# Patient Record
Sex: Male | Born: 1960 | Race: White | Hispanic: No | Marital: Married | Smoking: Current every day smoker
Health system: Southern US, Community
[De-identification: ages and names within clinical notes are randomized; demographics above are authoritative.]

## PROBLEM LIST (undated history)

## (undated) DIAGNOSIS — Z86010 Personal history of colonic polyps: Principal | ICD-10-CM

## (undated) DIAGNOSIS — G4730 Sleep apnea, unspecified: Secondary | ICD-10-CM

## (undated) DIAGNOSIS — F329 Major depressive disorder, single episode, unspecified: Secondary | ICD-10-CM

## (undated) DIAGNOSIS — I10 Essential (primary) hypertension: Secondary | ICD-10-CM

## (undated) DIAGNOSIS — E349 Endocrine disorder, unspecified: Secondary | ICD-10-CM

## (undated) DIAGNOSIS — T7840XA Allergy, unspecified, initial encounter: Secondary | ICD-10-CM

## (undated) DIAGNOSIS — E119 Type 2 diabetes mellitus without complications: Secondary | ICD-10-CM

## (undated) DIAGNOSIS — F419 Anxiety disorder, unspecified: Secondary | ICD-10-CM

## (undated) DIAGNOSIS — E7800 Pure hypercholesterolemia, unspecified: Secondary | ICD-10-CM

## (undated) DIAGNOSIS — K219 Gastro-esophageal reflux disease without esophagitis: Secondary | ICD-10-CM

## (undated) DIAGNOSIS — F32A Depression, unspecified: Secondary | ICD-10-CM

## (undated) HISTORY — DX: Major depressive disorder, single episode, unspecified: F32.9

## (undated) HISTORY — PX: POLYPECTOMY: SHX149

## (undated) HISTORY — DX: Type 2 diabetes mellitus without complications: E11.9

## (undated) HISTORY — DX: Gastro-esophageal reflux disease without esophagitis: K21.9

## (undated) HISTORY — DX: Allergy, unspecified, initial encounter: T78.40XA

## (undated) HISTORY — DX: Depression, unspecified: F32.A

## (undated) HISTORY — DX: Sleep apnea, unspecified: G47.30

## (undated) HISTORY — DX: Anxiety disorder, unspecified: F41.9

## (undated) HISTORY — PX: ANTERIOR CRUCIATE LIGAMENT REPAIR: SHX115

## (undated) HISTORY — DX: Endocrine disorder, unspecified: E34.9

## (undated) HISTORY — PX: COLONOSCOPY: SHX174

## (undated) HISTORY — DX: Personal history of colonic polyps: Z86.010

---

## 2003-09-07 ENCOUNTER — Emergency Department (HOSPITAL_COMMUNITY): Admission: EM | Admit: 2003-09-07 | Discharge: 2003-09-07 | Payer: Self-pay | Admitting: Emergency Medicine

## 2007-04-27 ENCOUNTER — Encounter: Admission: RE | Admit: 2007-04-27 | Discharge: 2007-04-27 | Payer: Self-pay | Admitting: Family Medicine

## 2009-05-22 ENCOUNTER — Ambulatory Visit: Payer: Self-pay | Admitting: Cardiology

## 2009-09-22 ENCOUNTER — Encounter
Admission: RE | Admit: 2009-09-22 | Discharge: 2009-12-24 | Payer: Self-pay | Source: Home / Self Care | Admitting: *Deleted

## 2009-12-25 ENCOUNTER — Encounter
Admission: RE | Admit: 2009-12-25 | Discharge: 2010-03-18 | Payer: Self-pay | Source: Home / Self Care | Attending: *Deleted | Admitting: *Deleted

## 2010-03-21 HISTORY — PX: ANTERIOR CRUCIATE LIGAMENT REPAIR: SHX115

## 2011-09-10 ENCOUNTER — Emergency Department (HOSPITAL_COMMUNITY)
Admission: EM | Admit: 2011-09-10 | Discharge: 2011-09-10 | Disposition: A | Discharge status: Home / Self Care | Payer: BC Managed Care – PPO | Attending: Emergency Medicine | Admitting: Emergency Medicine

## 2011-09-10 ENCOUNTER — Encounter (HOSPITAL_COMMUNITY): Payer: Self-pay | Admitting: *Deleted

## 2011-09-10 DIAGNOSIS — H00039 Abscess of eyelid unspecified eye, unspecified eyelid: Secondary | ICD-10-CM | POA: Insufficient documentation

## 2011-09-10 DIAGNOSIS — F17200 Nicotine dependence, unspecified, uncomplicated: Secondary | ICD-10-CM | POA: Insufficient documentation

## 2011-09-10 DIAGNOSIS — E7800 Pure hypercholesterolemia, unspecified: Secondary | ICD-10-CM | POA: Insufficient documentation

## 2011-09-10 DIAGNOSIS — I10 Essential (primary) hypertension: Secondary | ICD-10-CM | POA: Insufficient documentation

## 2011-09-10 DIAGNOSIS — R51 Headache: Secondary | ICD-10-CM | POA: Insufficient documentation

## 2011-09-10 DIAGNOSIS — L03213 Periorbital cellulitis: Secondary | ICD-10-CM

## 2011-09-10 DIAGNOSIS — H5710 Ocular pain, unspecified eye: Secondary | ICD-10-CM | POA: Insufficient documentation

## 2011-09-10 HISTORY — DX: Essential (primary) hypertension: I10

## 2011-09-10 HISTORY — DX: Pure hypercholesterolemia, unspecified: E78.00

## 2011-09-10 MED ORDER — TETRACAINE HCL 0.5 % OP SOLN
1.0000 [drp] | Freq: Once | OPHTHALMIC | Status: AC
  Administered 2011-09-10: 1 [drp] via OPHTHALMIC

## 2011-09-10 MED ORDER — ERYTHROMYCIN 5 MG/GM OP OINT
TOPICAL_OINTMENT | Freq: Four times a day (QID) | OPHTHALMIC | Status: DC
  Administered 2011-09-10: 22:00:00 via OPHTHALMIC
  Filled 2011-09-10: qty 3.5

## 2011-09-10 MED ORDER — TETRACAINE HCL 0.5 % OP SOLN
OPHTHALMIC | Status: AC
  Filled 2011-09-10: qty 2

## 2011-09-10 MED ORDER — AMOXICILLIN-POT CLAVULANATE 875-125 MG PO TABS: 1.0000 | ORAL_TABLET | Freq: Two times a day (BID) | ORAL | Status: AC

## 2011-09-10 MED ORDER — FLUORESCEIN SODIUM 1 MG OP STRP
1.0000 | ORAL_STRIP | Freq: Once | OPHTHALMIC | Status: AC
  Administered 2011-09-10: 1 via OPHTHALMIC

## 2011-09-10 NOTE — Discharge Instructions (Signed)
Periorbital Cellulitis Periorbital cellulitis is a common infection that can affect the eyelid and the soft tissues that surround the eyeball. The infection may also affect the structures that produce and drain tears. It does not affect the eyeball itself. Natural tissue barriers usually prevent the spread of this infection to the eyeball and other deeper areas of the eye socket.  CAUSES  Bacterial infection.   Long-term (chronic) sinus infections.   An object (foreign body) stuck behind the eye.   An injury that goes through the eyelid tissues.   An injury that causes an infection, such as an insect sting.   Fracture of the bone around the eye.   Infections which have spread from the eyelid or other structures around the eye.   Bite wounds.   Inflammation or infection of the lining membranes of the brain (meningitis).   An infection in the blood (septicemia).   Dental infection (abscess).   Viral infection (this is rare).  SYMPTOMS Symptoms usually come on suddenly.  Pain in the eye.   Red, hot, and swollen eyelids and possibly cheeks. The swelling is sometimes bad enough that the eyelids cannot open. Some infections make the eyelids look purple.   Fever and feeling generally ill.   Pain when touching the area around the eye.  DIAGNOSIS  Periorbital cellulitis can be diagnosed from an eye exam. In severe cases, your caregiver might suggest:  Blood tests.   Imaging tests (such as a CT scan) to examine the sinuses and the area around and behind the eyeball.  TREATMENT If your caregiver feels that you do not have any signs of serious infection, treatment may include:  Antibiotics.   Nasal decongestants to reduce swelling.   Referral to a dentist if it is suspected that the infection was caused by a prior tooth infection.   Examination every day to make sure the problem is improving.  HOME CARE INSTRUCTIONS  Take your antibiotics as directed. Finish them even  if you start to feel better.   Some pain is normal with this condition. Take pain medicine as directed by your caregiver. Only take pain medicines approved by your caregiver.   It is important to drink fluids. Drink enough water and fluids to keep your urine clear or pale yellow.   Do not smoke.   Rest and get plenty of sleep.   Mild or moderate fevers generally have no long-term effects and often do not require treatment.   If your caregiver has given you a follow-up appointment, it is very important to keep that appointment. Your caregiver will need to make sure that the infection is getting better. It is important to check that a more serious infection is not developing.  SEEK IMMEDIATE MEDICAL CARE IF:  Your eyelids become more painful, red, warm, or swollen.   You develop double vision or your vision becomes blurred or worsens in any way.   You have trouble moving your eyes.   The eye looks like it is popping out (proptosis).   You develop a severe headache, severe neck pain, or neck stiffness.   You develop repeated vomiting.   You have a fever or persistent symptoms for more than 72 hours.   You have a fever and your symptoms suddenly get worse.  MAKE SURE YOU:  Understand these instructions.   Will watch your condition.   Will get help right away if you are not doing well or get worse.  Document Released: 04/09/2010 Document Revised: 02/24/2011   Document Reviewed: 04/09/2010 [employer] Patient Information 2012 [employer]. 

## 2011-09-10 NOTE — ED Notes (Signed)
Pt c/o left eye pain and swelling since Thursday.

## 2011-09-10 NOTE — ED Provider Notes (Signed)
History   This chart was scribed for Joya Gaskins, MD by Melba Coon. The patient was seen in room APA19/[facility]19 and the patient's care was started at 9:15PM.    CSN: [id number]  Arrival date & time 09/10/11  2048   First MD Initiated Contact with Patient 09/10/11 2113      Chief Complaint  Patient presents with  . Eye Pain     HPI Clayton [name] is a 51 y.o. male who presents to the Emergency Department complaining of cosntant, moderate to severe left eye pain and swelling with an onset 2 days ago. Pt woke up with the present symptoms. No foreign bodies present that he noticed, but states it is possible that something could have went into his eye at work; pt is a Curator. Pt has been putting abx eye drops which have not alleviated the s/s. Pt states that he feels like there is something in his eye and that his eye is always watery. HA started 2 hrs ago. No fever, neck pain, sore throat, rash, back pain, CP, SOB, abd pain, nausea, vomit, diarrhea, dysuria, or extremity pain, edema, weakness, numbness, or tingling. Hx of HTN and high cholesterol. No known allergies. No other pertinent medical symptoms.  Past Medical History  Diagnosis Date  . Hypertension   . High cholesterol     Past Surgical History  Procedure Date  . Anterior cruciate ligament repair     History reviewed. No pertinent family history.  History  Substance Use Topics  . Smoking status: Current Everyday Smoker  . Smokeless tobacco: Not on file  . Alcohol Use: Yes      Review of Systems 10 Systems reviewed and all are negative for acute change except as noted in the HPI.   Allergies  Review of patient's allergies indicates no known allergies.  Home Medications  No current outpatient prescriptions on file.  BP 149/79  Pulse 87  Temp 97.9 F (36.6 C) (Oral)  Resp 14  Ht 5\' 10"  (1.778 m)  Wt 248 lb (112.492 kg)  BMI 35.58 kg/m2  SpO2 97%  Physical Exam CONSTITUTIONAL: Well  developed/well nourished HEAD AND FACE: Normocephalic/atraumatic EYES: EOMI/PERRL; periorbital edema of left eyelid w/ small amount of discharge No proptosis.  No hypopyon.  No abrasions.  No foreign body noted to left eye No abscess noted ENMT: Mucous membranes moist NECK: supple no meningeal signs SPINE:entire spine nontender CV: S1/S2 noted, no murmurs/rubs/gallops noted LUNGS: Lungs are clear to auscultation bilaterally, no apparent distress ABDOMEN: soft, nontender, no rebound or guarding GU:no cva tenderness NEURO: Pt is awake/alert, moves all extremitiesx4 EXTREMITIES: pulses normal, full ROM SKIN: warm, color normal PSYCH: no abnormalities of mood noted  ED Course  Procedures   DIAGNOSTIC STUDIES: Oxygen Saturation is 97% on room air, normal by my interpretation.    COORDINATION OF CARE:  9:20PM - EDMD will perform more extensive eye exam for the pt and ordered pontocaine and fluorescin ophthalmic strip for the pt. Likely early periorbital cellulitis, no visual deficits, stable for d/c and outpatient meds    MDM  Nursing notes including past medical history and social history reviewed and considered in documentation  I personally performed the services described in this documentation, which was scribed in my presence. The recorded information has been reviewed and considered.           Joya Gaskins, MD 09/11/11 [phone]

## 2011-11-06 LAB — HEPATIC FUNCTION PANEL
ALT: 33 U/L (ref 10–40)
AST: 18 U/L (ref 14–40)
Alkaline Phosphatase: 54 U/L (ref 25–125)
Bilirubin, Direct: 0.2 mg/dL (ref 0.01–0.4)
Bilirubin, Total: 0.8 mg/dL

## 2011-11-06 LAB — LIPID PANEL
Cholesterol: 213 mg/dL — AB (ref 0–200)
HDL: 35 mg/dL (ref 35–70)
LDL Cholesterol: 133 mg/dL
Triglycerides: 223 mg/dL — AB (ref 40–160)

## 2011-11-06 LAB — BASIC METABOLIC PANEL
BUN: 15 mg/dL (ref 4–21)
Creatinine: 0.9 mg/dL (ref 0.6–1.3)
Glucose: 119 mg/dL
Potassium: 3.6 mmol/L (ref 3.4–5.3)
Sodium: 138 mmol/L (ref 137–147)

## 2012-06-14 ENCOUNTER — Ambulatory Visit (INDEPENDENT_AMBULATORY_CARE_PROVIDER_SITE_OTHER): Payer: BC Managed Care – PPO | Admitting: Nurse Practitioner

## 2012-06-14 ENCOUNTER — Telehealth: Payer: Self-pay | Admitting: Nurse Practitioner

## 2012-06-14 ENCOUNTER — Encounter: Payer: Self-pay | Admitting: Nurse Practitioner

## 2012-06-14 VITALS — BP 159/86 | HR 76 | Temp 98.4°F | Ht 69.0 in | Wt 254.5 lb

## 2012-06-14 DIAGNOSIS — F329 Major depressive disorder, single episode, unspecified: Secondary | ICD-10-CM

## 2012-06-14 DIAGNOSIS — E1169 Type 2 diabetes mellitus with other specified complication: Secondary | ICD-10-CM | POA: Insufficient documentation

## 2012-06-14 DIAGNOSIS — H9209 Otalgia, unspecified ear: Secondary | ICD-10-CM

## 2012-06-14 DIAGNOSIS — F32A Depression, unspecified: Secondary | ICD-10-CM | POA: Insufficient documentation

## 2012-06-14 DIAGNOSIS — R1310 Dysphagia, unspecified: Secondary | ICD-10-CM

## 2012-06-14 DIAGNOSIS — I10 Essential (primary) hypertension: Secondary | ICD-10-CM

## 2012-06-14 DIAGNOSIS — H9202 Otalgia, left ear: Secondary | ICD-10-CM

## 2012-06-14 DIAGNOSIS — E785 Hyperlipidemia, unspecified: Secondary | ICD-10-CM

## 2012-06-14 DIAGNOSIS — K219 Gastro-esophageal reflux disease without esophagitis: Secondary | ICD-10-CM

## 2012-06-14 HISTORY — DX: Essential (primary) hypertension: I10

## 2012-06-14 HISTORY — DX: Type 2 diabetes mellitus with other specified complication: E11.69

## 2012-06-14 MED ORDER — PANTOPRAZOLE SODIUM 40 MG PO TBEC: 40.0000 mg | DELAYED_RELEASE_TABLET | Freq: Every day | ORAL | Status: DC

## 2012-06-14 NOTE — Patient Instructions (Signed)
1. Left ear pain Continue Amoxicillin currently have OTC decongestant  2. Dysphagia, unspecified/GERD Stop prilosec Avoid spicy and fatty foods - pantoprazole (PROTONIX) 40 MG tablet; Take 1 tablet (40 mg total) by mouth daily.  Dispense: 30 tablet; Refill: 3 - Ambulatory referral to Gastroenterology  3. Essential hypertension, benign Continue current meds  4. Hyperlipidemia LDL goal < 100 Continue current meds Low fat diet  5. Depression Continue current meds  Stress management  NEEDS F/U appoint for labsDysphagia Swallowing problems (dysphagia) occur when solids and liquids seem to stick in your throat on the way down to your stomach, or the food takes longer to get to the stomach. Other symptoms (problems) include regurgitating (burping) up food, noises coming from the throat, chest discomfort with swallowing, and a feeling of fullness in the throat when swallowing. When blockage in the throat is complete it may be associated with drooling. CAUSES There are many causes of swallowing difficulties and the following is generalized information regarding a number of reasons for this problem. Problems with swallowing may occur because of problems with the muscles. The food cannot be propelled in the usual manner into the stomach. There may be ulcers, scar tissue, or inflammation (soreness) in the esophagus (the food tube from the mouth to the stomach) which blocks food from passing normally into the stomach. Causes of inflammation include acid reflux from the stomach into the esophagus. Inflammation can also be caused by the herpes simplex virus, Candida (yeast), radiation (as with treatment of cancer), or inflammation from medicines not taken with adequate fluids to wash them down into the stomach. There may be nerve problems so signals cannot be sent adequately telling the muscles of the esophagus to contract and move the food along. Achalasia is a rare disorder of the esophagus in which  muscular contractions of the esophagus are uncoordinated. Globus hystericus is a relatively common problem in young females in which there is a sense of an obstruction or difficulty in swallowing, but in which no abnormalities can be found. This problem usually improves over time with reassurance and testing to rule out other causes. DIAGNOSIS A number of tests will help your caregiver know what is the cause of your swallowing problems. These tests may include a barium swallow in which X-rays are taken while you are drinking a liquid that outlines the lining of the esophagus on X-ray. If the stomach and small bowel are also studied in this manner it is called an upper gastrointestinal exam (UGI). Endoscopy may be done in which your caregiver examines your throat, esophagus, stomach, and small bowel with a small, flexible scope. Motility studies which measure the effectiveness and coordination of the muscular contractions of the esophagus may also be done. TREATMENT The treatment of swallowing problems are many, varying from medicines to surgical treatment. The treatment varies with the type of problem found. Your caregiver will discuss your results and treatment with you. If swallowing problems are severe the long-term problems which may occur include: malnutrition, pneumonia (from food going into the breathing tubes called trachea and bronchi), and an increase in tumors (lumps) of the esophagus. SEEK IMMEDIATE MEDICAL CARE IF:  Food or another object becomes lodged in your throat or esophagus and will not move. Document Released: 03/04/2000 Document Revised: 09/06/2011 Document Reviewed: 10/24/2007 [facility] Patient Information 2013 [place], [place].   Mary-Margaret Daphine Deutscher, FNP

## 2012-06-14 NOTE — Telephone Encounter (Signed)
appt made

## 2012-06-14 NOTE — Telephone Encounter (Signed)
Wtbs, ear ache

## 2012-06-14 NOTE — Progress Notes (Signed)
  Subjective:    Patient ID: Clayton [name], male    DOB: [birth year], 52 y.o.   MRN: [id number]  HPI.Marland KitchenUpper Respiratory Infection: Patient complains of symptoms of a URI, left ear pain. Symptoms include left ear pain. Onset of symptoms was 5 days ago, gradually worsening since that time. He also c/o headache described as achy for the past 3 days .  He is drinking plenty of fluids. Evaluation to date: none sinus films: not done. Treatment to date: antibiotics that belong to his son, started taking Monday BID. Dysphagia Started 4 months ago. Even as trouble swallowing liquids. Dad had to have his esophagus stretched. FREQUENT COUGH.      Review of Systems  Constitutional: Negative.   HENT: Positive for ear pain. Negative for congestion, rhinorrhea and sinus pressure.   Respiratory: Negative.   Cardiovascular: Negative.   Psychiatric/Behavioral: Negative.        Objective:   Physical Exam  Constitutional: He appears well-developed and well-nourished.  HENT:  Head: Normocephalic.  Right Ear: External ear normal. Tympanic membrane is not scarred, not erythematous and not bulging.  Left Ear: External ear normal. Tympanic membrane is scarred. Tympanic membrane is not erythematous and not bulging.  Nose: Mucosal edema present. Right sinus exhibits no maxillary sinus tenderness and no frontal sinus tenderness. Left sinus exhibits no maxillary sinus tenderness and no frontal sinus tenderness.  Mouth/Throat: Uvula is midline, oropharynx is clear and moist and mucous membranes are normal.  Neck: Neck supple. No JVD present. Carotid bruit is not present. No mass and no thyromegaly present.  Cardiovascular: Normal rate, regular rhythm, normal heart sounds and intact distal pulses.   Pulmonary/Chest: Effort normal and breath sounds normal.  Lymphadenopathy:    He has no cervical adenopathy.  Skin: Skin is warm and dry.          Assessment & Plan:  1. Left ear pain Continue Amoxicillin  currently have OTC decongestant  2. Dysphagia, unspecified/GERD Stop prilosec Avoid spicy and fatty foods - pantoprazole (PROTONIX) 40 MG tablet; Take 1 tablet (40 mg total) by mouth daily.  Dispense: 30 tablet; Refill: 3 - Ambulatory referral to Gastroenterology  3. Essential hypertension, benign Continue current meds  4. Hyperlipidemia LDL goal < 100 Continue current meds Low fat diet  5. Depression Continue current meds  Stress management  NEEDS F/U appoint for labs  Mary-Margaret Daphine Deutscher, FNP

## 2012-06-15 ENCOUNTER — Encounter: Payer: Self-pay | Admitting: *Deleted

## 2012-06-15 ENCOUNTER — Other Ambulatory Visit (INDEPENDENT_AMBULATORY_CARE_PROVIDER_SITE_OTHER): Payer: BC Managed Care – PPO

## 2012-06-15 DIAGNOSIS — I10 Essential (primary) hypertension: Secondary | ICD-10-CM

## 2012-06-15 DIAGNOSIS — E785 Hyperlipidemia, unspecified: Secondary | ICD-10-CM

## 2012-06-15 DIAGNOSIS — Z125 Encounter for screening for malignant neoplasm of prostate: Secondary | ICD-10-CM

## 2012-06-15 DIAGNOSIS — E559 Vitamin D deficiency, unspecified: Secondary | ICD-10-CM

## 2012-06-15 LAB — COMPREHENSIVE METABOLIC PANEL
ALT: 27 U/L (ref 0–53)
AST: 16 U/L (ref 0–37)
Albumin: 4.2 g/dL (ref 3.5–5.2)
Alkaline Phosphatase: 81 U/L (ref 39–117)
BUN: 17 mg/dL (ref 6–23)
CO2: 30 mEq/L (ref 19–32)
Calcium: 9 mg/dL (ref 8.4–10.5)
Chloride: 100 mEq/L (ref 96–112)
Creat: 0.77 mg/dL (ref 0.50–1.35)
Glucose, Bld: 125 mg/dL — ABNORMAL HIGH (ref 70–99)
Potassium: 3.7 mEq/L (ref 3.5–5.3)
Sodium: 139 mEq/L (ref 135–145)
Total Bilirubin: 0.9 mg/dL (ref 0.3–1.2)
Total Protein: 6.7 g/dL (ref 6.0–8.3)

## 2012-06-15 NOTE — Telephone Encounter (Signed)
This encounter was created in error - please disregard.

## 2012-06-16 LAB — PSA: PSA: 0.7 ng/mL (ref <=4.00)

## 2012-06-16 LAB — VITAMIN D 25 HYDROXY (VIT D DEFICIENCY, FRACTURES): Vit D, 25-Hydroxy: 27 ng/mL — ABNORMAL LOW (ref 30–89)

## 2012-06-19 LAB — NMR LIPOPROFILE WITH LIPIDS
Cholesterol, Total: 139 mg/dL (ref <200)
HDL Particle Number: 23.8 umol/L — ABNORMAL LOW (ref >=30.5)
HDL Size: 8.6 nm — ABNORMAL LOW (ref >=9.2)
HDL-C: 28 mg/dL — ABNORMAL LOW (ref >=40)
LDL (calc): 72 mg/dL (ref <100)
LDL Particle Number: 1286 nmol/L — ABNORMAL HIGH (ref <1000)
LDL Size: 19.7 nm — ABNORMAL LOW (ref >20.5)
LP-IR Score: 84 — ABNORMAL HIGH (ref <=45)
Large HDL-P: <1.3 umol/L — ABNORMAL LOW (ref >=4.8)
Large VLDL-P: 9.2 nmol/L — ABNORMAL HIGH (ref <=2.7)
Small LDL Particle Number: 1056 nmol/L — ABNORMAL HIGH (ref <=527)
Triglycerides: 197 mg/dL — ABNORMAL HIGH (ref <150)
VLDL Size: 53 nm — ABNORMAL HIGH (ref >=46.6)

## 2012-06-25 ENCOUNTER — Telehealth: Payer: Self-pay | Admitting: Nurse Practitioner

## 2012-06-25 NOTE — Telephone Encounter (Signed)
Spoke with pt

## 2012-07-03 ENCOUNTER — Ambulatory Visit: Payer: BC Managed Care – PPO | Admitting: Nurse Practitioner

## 2012-07-13 ENCOUNTER — Ambulatory Visit: Payer: BC Managed Care – PPO | Admitting: Nurse Practitioner

## 2012-07-20 ENCOUNTER — Telehealth: Payer: Self-pay | Admitting: Nurse Practitioner

## 2012-07-20 NOTE — Telephone Encounter (Signed)
Which meds is he concerned about

## 2012-07-21 NOTE — Telephone Encounter (Signed)
His blood pressure RX

## 2012-07-23 MED ORDER — LOSARTAN POTASSIUM-HCTZ 100-25 MG PO TABS: 1.0000 | ORAL_TABLET | Freq: Every day | ORAL | Status: DC

## 2012-07-23 NOTE — Telephone Encounter (Signed)
rx sent in- patient aware.  

## 2012-07-23 NOTE — Telephone Encounter (Signed)
CHANGED TO LOSARTAN/HCTZ- RX SENT TO PHARMACY

## 2012-07-27 ENCOUNTER — Other Ambulatory Visit: Payer: Self-pay

## 2012-07-27 MED ORDER — ATORVASTATIN CALCIUM 40 MG PO TABS: 40.0000 mg | ORAL_TABLET | Freq: Every day | ORAL | Status: DC

## 2012-07-27 MED ORDER — CITALOPRAM HYDROBROMIDE 40 MG PO TABS: 40.0000 mg | ORAL_TABLET | Freq: Every day | ORAL | Status: DC

## 2012-08-15 ENCOUNTER — Ambulatory Visit: Payer: Self-pay | Admitting: Nurse Practitioner

## 2012-09-06 ENCOUNTER — Other Ambulatory Visit: Payer: Self-pay

## 2012-09-06 MED ORDER — CITALOPRAM HYDROBROMIDE 40 MG PO TABS: 40.0000 mg | ORAL_TABLET | Freq: Every day | ORAL | Status: DC

## 2012-10-16 ENCOUNTER — Other Ambulatory Visit: Payer: Self-pay | Admitting: *Deleted

## 2012-10-16 DIAGNOSIS — K219 Gastro-esophageal reflux disease without esophagitis: Secondary | ICD-10-CM

## 2012-10-16 DIAGNOSIS — R1310 Dysphagia, unspecified: Secondary | ICD-10-CM

## 2012-10-16 MED ORDER — CITALOPRAM HYDROBROMIDE 40 MG PO TABS: 40.0000 mg | ORAL_TABLET | Freq: Every day | ORAL | Status: DC

## 2012-10-16 MED ORDER — PANTOPRAZOLE SODIUM 40 MG PO TBEC: 40.0000 mg | DELAYED_RELEASE_TABLET | Freq: Every day | ORAL | Status: DC

## 2012-10-16 NOTE — Telephone Encounter (Signed)
LAST OV 3/14

## 2012-10-18 ENCOUNTER — Telehealth: Payer: Self-pay | Admitting: *Deleted

## 2012-10-18 NOTE — Telephone Encounter (Signed)
Received request for a rx refill from [facility] for HCTZ 25 qd. According to chart he is taking Losartan-hctz 100-25. Last office visit note says to continue same meds and no changes were made. Which med should he be taking. Please advise.

## 2012-10-18 NOTE — Telephone Encounter (Signed)
Find out from patient what he is taking

## 2012-10-25 ENCOUNTER — Telehealth: Payer: Self-pay | Admitting: Nurse Practitioner

## 2012-10-25 NOTE — Telephone Encounter (Signed)
Message being taken care of in another encounter.

## 2012-10-25 NOTE — Telephone Encounter (Signed)
Patient states he is taking the losartan-hctz not just plain hctz and he has already gotten his medications refilled

## 2012-11-16 ENCOUNTER — Encounter: Payer: Self-pay | Admitting: Nurse Practitioner

## 2012-11-16 ENCOUNTER — Ambulatory Visit (INDEPENDENT_AMBULATORY_CARE_PROVIDER_SITE_OTHER): Payer: BC Managed Care – PPO | Admitting: Nurse Practitioner

## 2012-11-16 VITALS — BP 152/85 | HR 85 | Temp 98.5°F | Wt 256.0 lb

## 2012-11-16 DIAGNOSIS — F32A Depression, unspecified: Secondary | ICD-10-CM

## 2012-11-16 DIAGNOSIS — R739 Hyperglycemia, unspecified: Secondary | ICD-10-CM

## 2012-11-16 DIAGNOSIS — R7309 Other abnormal glucose: Secondary | ICD-10-CM

## 2012-11-16 DIAGNOSIS — I10 Essential (primary) hypertension: Secondary | ICD-10-CM

## 2012-11-16 DIAGNOSIS — F329 Major depressive disorder, single episode, unspecified: Secondary | ICD-10-CM

## 2012-11-16 DIAGNOSIS — E785 Hyperlipidemia, unspecified: Secondary | ICD-10-CM

## 2012-11-16 DIAGNOSIS — K219 Gastro-esophageal reflux disease without esophagitis: Secondary | ICD-10-CM

## 2012-11-16 DIAGNOSIS — R1310 Dysphagia, unspecified: Secondary | ICD-10-CM

## 2012-11-16 MED ORDER — CITALOPRAM HYDROBROMIDE 40 MG PO TABS: 40.0000 mg | ORAL_TABLET | Freq: Every day | ORAL | Status: DC

## 2012-11-16 MED ORDER — ATORVASTATIN CALCIUM 40 MG PO TABS: 40.0000 mg | ORAL_TABLET | Freq: Every day | ORAL | Status: DC

## 2012-11-16 MED ORDER — PANTOPRAZOLE SODIUM 40 MG PO TBEC: 40.0000 mg | DELAYED_RELEASE_TABLET | Freq: Every day | ORAL | Status: DC

## 2012-11-16 MED ORDER — CHOLINE FENOFIBRATE 135 MG PO CPDR: 135.0000 mg | DELAYED_RELEASE_CAPSULE | Freq: Every day | ORAL | Status: DC

## 2012-11-16 MED ORDER — LOSARTAN POTASSIUM-HCTZ 100-25 MG PO TABS: 1.0000 | ORAL_TABLET | Freq: Every day | ORAL | Status: DC

## 2012-11-16 NOTE — Patient Instructions (Signed)

## 2012-11-16 NOTE — Progress Notes (Signed)
Subjective:    Patient ID: Clayton [name], male    DOB: [birth year], 52 y.o.   MRN: [id number]  Hypertension This is a chronic problem. The current episode started more than 1 year ago. The problem is unchanged. The problem is uncontrolled. Pertinent negatives include no blurred vision, chest pain, headaches, neck pain, palpitations, peripheral edema, shortness of breath or sweats. There are no associated agents to hypertension. Risk factors for coronary artery disease include family history, obesity, male gender and dyslipidemia. Past treatments include angiotensin blockers and diuretics. The current treatment provides moderate improvement. Compliance problems include diet and exercise.   Hyperlipidemia This is a chronic problem. The current episode started more than 1 year ago. The problem is uncontrolled. Recent lipid tests were reviewed and are high. Exacerbating diseases include obesity. He has no history of diabetes or hypothyroidism. There are no known factors aggravating his hyperlipidemia. Pertinent negatives include no chest pain, focal sensory loss, focal weakness, leg pain or shortness of breath. Current antihyperlipidemic treatment includes statins and fibric acid derivatives. The current treatment provides moderate improvement of lipids. Compliance problems include adherence to diet and adherence to exercise.  Risk factors for coronary artery disease include hypertension, male sex, obesity and family history.  depression Celexa working well to keep him calm and from worrying so much- No side effects GERD Currently on protonix- keeps symptoms under control  Review of Systems  HENT: Negative for neck pain.   Eyes: Negative for blurred vision.  Respiratory: Negative for shortness of breath.   Cardiovascular: Negative for chest pain and palpitations.  Neurological: Negative for focal weakness and headaches.  All other systems reviewed and are negative.       Objective:   Physical  Exam  Constitutional: He is oriented to person, place, and time. He appears well-developed and well-nourished.  HENT:  Head: Normocephalic.  Right Ear: External ear normal.  Left Ear: External ear normal.  Nose: Nose normal.  Mouth/Throat: Oropharynx is clear and moist.  Eyes: EOM are normal. Pupils are equal, round, and reactive to light.  Neck: Normal range of motion. Neck supple. No thyromegaly present.  Cardiovascular: Normal rate, regular rhythm, normal heart sounds and intact distal pulses.   No murmur heard. Pulmonary/Chest: Effort normal and breath sounds normal. He has no wheezes. He has no rales.  Abdominal: Soft. Bowel sounds are normal.  Musculoskeletal: Normal range of motion.  Neurological: He is alert and oriented to person, place, and time.  Skin: Skin is warm and dry.  Psychiatric: He has a normal mood and affect. His behavior is normal. Judgment and thought content normal.    BP 152/85  Pulse 85  Temp(Src) 98.5 F (36.9 C) (Oral)  Wt 256 lb (116.121 kg)  BMI 37.79 kg/m2       Assessment & Plan:  1. Hypertension Low NA+ diet - CMP14+EGFR - losartan-hydrochlorothiazide (HYZAAR) 100-25 MG per tablet; Take 1 tablet by mouth daily.  Dispense: 30 tablet; Refill: 5  2. Hyperlipidemia Low fat diet and exercise - NMR, lipoprofile - atorvastatin (LIPITOR) 40 MG tablet; Take 1 tablet (40 mg total) by mouth daily.  Dispense: 30 tablet; Refill: 5 - Choline Fenofibrate (TRILIPIX) 135 MG capsule; Take 1 capsule (135 mg total) by mouth daily.  Dispense: 30 capsule; Refill: 5  3. GERD (gastroesophageal reflux disease) Avoid spicy foods Do not eat 2 hours prior to bedtime - pantoprazole (PROTONIX) 40 MG tablet; Take 1 tablet (40 mg total) by mouth daily.  Dispense: 30 tablet; Refill:  5  4. Depression Stress management - citalopram (CELEXA) 40 MG tablet; Take 1 tablet (40 mg total) by mouth daily.  Dispense: 30 tablet; Refill: 5  Mary-Margaret Daphine Deutscher, FNP

## 2012-11-18 LAB — NMR, LIPOPROFILE
Cholesterol: 202 mg/dL — ABNORMAL HIGH (ref <200)
HDL Cholesterol by NMR: 27 mg/dL — ABNORMAL LOW (ref >=40)
HDL Particle Number: 26.6 umol/L — ABNORMAL LOW (ref >=30.5)
LDL Particle Number: 1905 nmol/L — ABNORMAL HIGH (ref <1000)
LDL Size: 19.8 nm — ABNORMAL LOW (ref >20.5)
LP-IR Score: 96 — ABNORMAL HIGH (ref <=45)
Small LDL Particle Number: 1508 nmol/L — ABNORMAL HIGH (ref <=527)
Triglycerides by NMR: 821 mg/dL — ABNORMAL HIGH (ref <150)

## 2012-11-18 LAB — CMP14+EGFR
ALT: 29 IU/L (ref 0–44)
AST: 14 IU/L (ref 0–40)
Albumin/Globulin Ratio: 1.4 (ref 1.1–2.5)
Albumin: 4.1 g/dL (ref 3.5–5.5)
Alkaline Phosphatase: 108 IU/L (ref 39–117)
BUN/Creatinine Ratio: 18 (ref 9–20)
BUN: 13 mg/dL (ref 6–24)
CO2: 25 mmol/L (ref 18–29)
Calcium: 9.3 mg/dL (ref 8.7–10.2)
Chloride: 96 mmol/L — ABNORMAL LOW (ref 97–108)
Creatinine, Ser: 0.74 mg/dL — ABNORMAL LOW (ref 0.76–1.27)
GFR calc Af Amer: 124 mL/min/{1.73_m2} (ref >59)
GFR calc non Af Amer: 108 mL/min/{1.73_m2} (ref >59)
Globulin, Total: 2.9 g/dL (ref 1.5–4.5)
Glucose: 194 mg/dL — ABNORMAL HIGH (ref 65–99)
Potassium: 3.2 mmol/L — ABNORMAL LOW (ref 3.5–5.2)
Sodium: 137 mmol/L (ref 134–144)
Total Bilirubin: 0.4 mg/dL (ref 0.0–1.2)
Total Protein: 7 g/dL (ref 6.0–8.5)

## 2012-11-23 LAB — POCT GLYCOSYLATED HEMOGLOBIN (HGB A1C): Hemoglobin A1C: 7.1

## 2012-11-23 NOTE — Addendum Note (Signed)
Addended by: Prescott Gum on: 11/23/2012 09:21 AM   Modules accepted: Orders

## 2012-12-27 ENCOUNTER — Other Ambulatory Visit: Payer: Self-pay | Admitting: Nurse Practitioner

## 2012-12-27 MED ORDER — METFORMIN HCL 500 MG PO TABS: 500.0000 mg | ORAL_TABLET | Freq: Two times a day (BID) | ORAL | Status: DC

## 2013-01-15 ENCOUNTER — Ambulatory Visit: Payer: Self-pay

## 2013-02-22 ENCOUNTER — Encounter: Payer: BC Managed Care – PPO | Admitting: Family Medicine

## 2013-03-01 ENCOUNTER — Encounter: Payer: Self-pay | Admitting: Family Medicine

## 2013-03-01 ENCOUNTER — Ambulatory Visit: Payer: BC Managed Care – PPO | Admitting: Family Medicine

## 2013-03-01 VITALS — BP 126/71 | HR 78 | Temp 98.8°F | Ht 68.5 in | Wt 254.0 lb

## 2013-03-01 DIAGNOSIS — I10 Essential (primary) hypertension: Secondary | ICD-10-CM

## 2013-03-01 DIAGNOSIS — G4733 Obstructive sleep apnea (adult) (pediatric): Secondary | ICD-10-CM | POA: Insufficient documentation

## 2013-03-01 DIAGNOSIS — Z0289 Encounter for other administrative examinations: Secondary | ICD-10-CM

## 2013-03-01 HISTORY — DX: Obstructive sleep apnea (adult) (pediatric): G47.33

## 2013-03-01 LAB — POCT URINALYSIS DIPSTICK
Bilirubin, UA: NEGATIVE
Glucose, UA: NEGATIVE
Ketones, UA: NEGATIVE
Leukocytes, UA: NEGATIVE
Nitrite, UA: NEGATIVE
Protein, UA: NEGATIVE
Spec Grav, UA: >=1.03
Urobilinogen, UA: NEGATIVE
pH, UA: 5

## 2013-03-01 NOTE — Progress Notes (Signed)
   Subjective:    Patient ID: Clayton [name], male    DOB: [birth year], 52 y.o.   MRN: [id number]  HPI This 52 y.o. male presents for evaluation of DOT PE.  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 He was dx with this a few years ago.  He reports 100% compliance.  He has hx of hypertension. He has no acute complaints.   Review of Systems No chest pain, SOB, HA, dizziness, vision change, N/V, diarrhea, constipation, dysuria, urinary urgency or frequency, myalgias, arthralgias or rash.     Objective:   Physical Exam  Vital signs noted  Well developed well nourished male.  HEENT - Head atraumatic Normocephalic                Eyes - PERRLA, Conjuctiva - clear Sclera- Clear EOMI                Ears - EAC's Wnl TM's Wnl Gross Hearing WNL                Nose - Nares patent                 Throat - oropharanx wnl Respiratory - Lungs CTA bilateral Cardiac - RRR S1 and S2 without murmur GI - Abdomen soft Nontender and bowel sounds active x 4 Extremities - No edema. Neuro - Grossly intact.  Results for orders placed in visit on 03/01/13  POCT URINALYSIS DIPSTICK      Result Value Range   Color, UA GOLD     Clarity, UA CLEAR     Glucose, UA NEG     Bilirubin, UA NEG     Ketones, UA NEG     Spec Grav, UA >=1.030     Blood, UA TRACE     pH, UA 5.0     Protein, UA NEG     Urobilinogen, UA negative     Nitrite, UA NEG     Leukocytes, UA Negative        Assessment & Plan:  Hypertension - Plan: POCT urinalysis.  Bp is controlled.  OSA - Continue bipap and bring in report of compliancy.  DOT exam - Need to bring in bipap compliancy report and explained that he can go to his respiratory Office for this and they will provide him this information and then will be able to certify him for a year.  Deatra Canter FNP

## 2013-03-01 NOTE — Patient Instructions (Signed)

## 2013-03-08 ENCOUNTER — Telehealth: Payer: Self-pay | Admitting: Family Medicine

## 2013-03-12 NOTE — Telephone Encounter (Signed)
Patient was notified by occupational health and he is coming back in to complete the paperwork next week.

## 2013-05-31 ENCOUNTER — Ambulatory Visit: Payer: BC Managed Care – PPO | Admitting: Nurse Practitioner

## 2013-09-21 ENCOUNTER — Other Ambulatory Visit: Payer: Self-pay | Admitting: Nurse Practitioner

## 2013-09-24 ENCOUNTER — Other Ambulatory Visit: Payer: Self-pay | Admitting: Family Medicine

## 2013-09-24 DIAGNOSIS — I10 Essential (primary) hypertension: Secondary | ICD-10-CM

## 2013-09-24 DIAGNOSIS — E785 Hyperlipidemia, unspecified: Secondary | ICD-10-CM

## 2013-09-24 MED ORDER — LOSARTAN POTASSIUM-HCTZ 100-25 MG PO TABS: 1.0000 | ORAL_TABLET | Freq: Every day | ORAL | Status: DC

## 2013-09-24 MED ORDER — ATORVASTATIN CALCIUM 40 MG PO TABS: 40.0000 mg | ORAL_TABLET | Freq: Every day | ORAL | Status: DC

## 2013-09-24 NOTE — Telephone Encounter (Signed)
Last seen 03/01/13 and last labs 11/16/12

## 2013-11-02 ENCOUNTER — Other Ambulatory Visit: Payer: Self-pay | Admitting: Family Medicine

## 2013-11-04 NOTE — Telephone Encounter (Signed)
no more refills without being seen  

## 2013-12-13 ENCOUNTER — Other Ambulatory Visit: Payer: Self-pay | Admitting: Nurse Practitioner

## 2013-12-16 NOTE — Telephone Encounter (Signed)
Patient last seen in office on 03-01-13. Was supposed to follow up in 3 months. Please advise on refill

## 2013-12-16 NOTE — Telephone Encounter (Signed)
no more refills without being seen  

## 2014-01-18 ENCOUNTER — Other Ambulatory Visit: Payer: Self-pay | Admitting: Nurse Practitioner

## 2014-02-11 ENCOUNTER — Encounter: Payer: Self-pay | Admitting: Nurse Practitioner

## 2014-02-11 ENCOUNTER — Ambulatory Visit: Payer: BC Managed Care – PPO | Admitting: Nurse Practitioner

## 2014-02-11 VITALS — BP 138/88 | HR 76 | Temp 97.2°F | Ht 70.0 in | Wt 246.0 lb

## 2014-02-11 DIAGNOSIS — Z024 Encounter for examination for driving license: Secondary | ICD-10-CM

## 2014-02-11 LAB — POCT URINALYSIS DIPSTICK
Bilirubin, UA: NEGATIVE
Blood, UA: NEGATIVE
Glucose, UA: NEGATIVE
Ketones, UA: NEGATIVE
Leukocytes, UA: NEGATIVE
Nitrite, UA: NEGATIVE
Spec Grav, UA: 1.02
Urobilinogen, UA: NEGATIVE
pH, UA: 5

## 2014-02-11 NOTE — Progress Notes (Signed)
DOT PHYSICAL- see scanned report

## 2014-02-21 ENCOUNTER — Ambulatory Visit: Payer: BC Managed Care – PPO | Admitting: Family Medicine

## 2014-02-21 ENCOUNTER — Telehealth: Payer: Self-pay | Admitting: Nurse Practitioner

## 2014-02-27 NOTE — Telephone Encounter (Signed)
done

## 2014-04-04 ENCOUNTER — Ambulatory Visit: Payer: BC Managed Care – PPO | Admitting: Nurse Practitioner

## 2014-04-09 ENCOUNTER — Other Ambulatory Visit: Payer: Self-pay | Admitting: Nurse Practitioner

## 2014-04-09 NOTE — Telephone Encounter (Signed)
Last seen 02/11/14 MMM  Last lipid 11/16/12

## 2014-05-09 ENCOUNTER — Encounter: Payer: Self-pay | Admitting: Nurse Practitioner

## 2014-05-09 ENCOUNTER — Ambulatory Visit (INDEPENDENT_AMBULATORY_CARE_PROVIDER_SITE_OTHER): Payer: BLUE CROSS/BLUE SHIELD | Admitting: Nurse Practitioner

## 2014-05-09 VITALS — BP 138/87 | HR 76 | Temp 98.2°F | Ht 70.0 in | Wt 254.0 lb

## 2014-05-09 DIAGNOSIS — F329 Major depressive disorder, single episode, unspecified: Secondary | ICD-10-CM

## 2014-05-09 DIAGNOSIS — E119 Type 2 diabetes mellitus without complications: Secondary | ICD-10-CM

## 2014-05-09 DIAGNOSIS — F17200 Nicotine dependence, unspecified, uncomplicated: Secondary | ICD-10-CM

## 2014-05-09 DIAGNOSIS — I10 Essential (primary) hypertension: Secondary | ICD-10-CM

## 2014-05-09 DIAGNOSIS — E785 Hyperlipidemia, unspecified: Secondary | ICD-10-CM

## 2014-05-09 DIAGNOSIS — F32A Depression, unspecified: Secondary | ICD-10-CM

## 2014-05-09 DIAGNOSIS — K219 Gastro-esophageal reflux disease without esophagitis: Secondary | ICD-10-CM

## 2014-05-09 DIAGNOSIS — Z720 Tobacco use: Secondary | ICD-10-CM

## 2014-05-09 DIAGNOSIS — G4733 Obstructive sleep apnea (adult) (pediatric): Secondary | ICD-10-CM

## 2014-05-09 HISTORY — DX: Type 2 diabetes mellitus without complications: E11.9

## 2014-05-09 LAB — POCT GLYCOSYLATED HEMOGLOBIN (HGB A1C): Hemoglobin A1C: 8

## 2014-05-09 LAB — POCT UA - MICROALBUMIN: Microalbumin Ur, POC: 50 mg/L

## 2014-05-09 MED ORDER — PANTOPRAZOLE SODIUM 40 MG PO TBEC: DELAYED_RELEASE_TABLET | ORAL | Status: DC

## 2014-05-09 MED ORDER — ESCITALOPRAM OXALATE 10 MG PO TABS: 10.0000 mg | ORAL_TABLET | Freq: Every day | ORAL | Status: DC

## 2014-05-09 MED ORDER — LOSARTAN POTASSIUM-HCTZ 100-25 MG PO TABS: ORAL_TABLET | ORAL | Status: DC

## 2014-05-09 MED ORDER — CITALOPRAM HYDROBROMIDE 40 MG PO TABS: ORAL_TABLET | ORAL | Status: DC

## 2014-05-09 MED ORDER — ATORVASTATIN CALCIUM 40 MG PO TABS: ORAL_TABLET | ORAL | Status: DC

## 2014-05-09 MED ORDER — METFORMIN HCL 500 MG PO TABS: 500.0000 mg | ORAL_TABLET | Freq: Two times a day (BID) | ORAL | Status: DC

## 2014-05-09 MED ORDER — CHOLINE FENOFIBRATE 135 MG PO CPDR: 135.0000 mg | DELAYED_RELEASE_CAPSULE | Freq: Every day | ORAL | Status: DC

## 2014-05-09 NOTE — Progress Notes (Signed)
Subjective:    Patient ID: Clayton [name], male    DOB: [birth year], 54 y.o.   MRN: [id number]   Patient here today for follow up of chronic medical problems. Newly diagnosed diabetic at last visit. He never went and picked up metformin, so he has not been taking. Was suppose to make appointment with clinical pharmacist but never did.   Hypertension This is a chronic problem. The current episode started more than 1 year ago. The problem has been waxing and waning since onset. Pertinent negatives include no chest pain, headaches, neck pain, palpitations or shortness of breath. Risk factors for coronary artery disease include dyslipidemia, family history and obesity. Past treatments include angiotensin blockers and diuretics. The current treatment provides moderate improvement. Compliance problems include diet and exercise.   Hyperlipidemia This is a chronic problem. The current episode started more than 1 year ago. The problem is controlled. Recent lipid tests were reviewed and are normal. He has no history of diabetes, hypothyroidism or obesity. Pertinent negatives include no chest pain or shortness of breath. Current antihyperlipidemic treatment includes statins and ezetimibe. The current treatment provides moderate improvement of lipids. Compliance problems include adherence to diet and adherence to exercise.  Risk factors for coronary artery disease include dyslipidemia, hypertension and male sex.  Diabetes He presents for his follow-up diabetic visit. He has type 2 diabetes mellitus. No MedicAlert identification noted. Pertinent negatives for hypoglycemia include no headaches. There are no diabetic associated symptoms. Pertinent negatives for diabetes include no chest pain. There are no hypoglycemic complications. Symptoms are stable. There are no diabetic complications. Risk factors for coronary artery disease include diabetes mellitus, dyslipidemia, hypertension, male sex, obesity and sedentary  lifestyle. Current diabetic treatment includes oral agent (monotherapy). His weight is stable. He is following a generally unhealthy diet. When asked about meal planning, he reported none. He has not had a previous visit with a dietitian. (Does not check blood sugars at home) An ACE inhibitor/angiotensin II receptor blocker is being taken. He does not see a podiatrist.Eye exam is current.  depression Celexa working well to keep him calm and from worrying so much- No side effects GERD Currently on protonix- keeps symptoms under control  Review of Systems  Constitutional: Negative.   HENT: Negative.   Respiratory: Negative for shortness of breath.   Cardiovascular: Negative for chest pain and palpitations.  Musculoskeletal: Negative for neck pain.  Neurological: Negative for headaches.  Psychiatric/Behavioral: Negative.   All other systems reviewed and are negative.      Objective:   Physical Exam  Constitutional: He is oriented to person, place, and time. He appears well-developed and well-nourished.  HENT:  Head: Normocephalic.  Right Ear: External ear normal.  Left Ear: External ear normal.  Nose: Nose normal.  Mouth/Throat: Oropharynx is clear and moist.  Eyes: EOM are normal. Pupils are equal, round, and reactive to light.  Neck: Normal range of motion. Neck supple. No thyromegaly present.  Cardiovascular: Normal rate, regular rhythm, normal heart sounds and intact distal pulses.   No murmur heard. Pulmonary/Chest: Effort normal and breath sounds normal. He has no wheezes. He has no rales.  Abdominal: Soft. Bowel sounds are normal.  Musculoskeletal: Normal range of motion.  Neurological: He is alert and oriented to person, place, and time.  Skin: Skin is warm and dry.  Psychiatric: He has a normal mood and affect. His behavior is normal. Judgment and thought content normal.   BP 138/87 mmHg  Pulse 76  Temp(Src) 98.2 F (  36.8 C) (Oral)  Ht _0  (1.778 m)  Wt 254 lb  (115.214 kg)  BMI 36.45 kg/m2   Results for orders placed or performed in visit on 05/09/14  POCT glycosylated hemoglobin (Hb A1C)  Result Value Ref Range   Hemoglobin A1C 8.0   POCT UA - Microalbumin  Result Value Ref Range   Microalbumin Ur, POC 50 mg/L         Assessment & Plan:  1. Essential hypertension Do not add slat to diet - CMP14+EGFR - losartan-hydrochlorothiazide (HYZAAR) 100-25 MG per tablet; TAKE ONE (1) TABLET EACH DAY  Dispense: 30 tablet; Refill: 5  2. Type 2 diabetes mellitus without complication Carb counting encouraged Patient told to start on metformin Appointment with clinical pharmacsit to discuss diabetes - POCT glycosylated hemoglobin (Hb A1C) - POCT UA - Microalbumin - metFORMIN (GLUCOPHAGE) 500 MG tablet; Take 1 tablet (500 mg total) by mouth 2 (two) times daily with a meal.  Dispense: 60 tablet; Refill: 5  3. OSA (obstructive sleep apnea) CPAP machine nightly  4. Gastroesophageal reflux disease without esophagitis Avoid spicy foods - pantoprazole (PROTONIX) 40 MG tablet; TAKE ONE (1) TABLET EACH DAY  Dispense: 30 tablet; Refill: 5  5. Hyperlipidemia with target LDL less than 100 low fat diet - NMR, lipoprofile - Choline Fenofibrate (TRILIPIX) 135 MG capsule; Take 1 capsule (135 mg total) by mouth daily.  Dispense: 30 capsule; Refill: 5 - atorvastatin (LIPITOR) 40 MG tablet; TAKE ONE (1) TABLET EACH DAY  Dispense: 30 tablet; Refill: 5  6. Depression Stress management Stopped celexa -lexapro 62m 1 po qd #30 5 refills   Refuses adult immunizations Labs pending Health maintenance reviewed Diet and exercise encouraged Continue all meds Follow up  In 3 months   MCaroleen FNP

## 2014-05-09 NOTE — Patient Instructions (Signed)

## 2014-05-10 LAB — NMR, LIPOPROFILE
Cholesterol: 150 mg/dL (ref 100–199)
HDL Cholesterol by NMR: 31 mg/dL — ABNORMAL LOW (ref >39)
HDL Particle Number: 24.7 umol/L — ABNORMAL LOW (ref >=30.5)
LDL Particle Number: 1397 nmol/L — ABNORMAL HIGH (ref <1000)
LDL Size: 20.1 nm (ref >20.5)
LDL-C: 69 mg/dL (ref 0–99)
LP-IR Score: 93 — ABNORMAL HIGH (ref <=45)
Small LDL Particle Number: 940 nmol/L — ABNORMAL HIGH (ref <=527)
Triglycerides by NMR: 248 mg/dL — ABNORMAL HIGH (ref 0–149)

## 2014-05-10 LAB — CMP14+EGFR
ALT: 23 IU/L (ref 0–44)
AST: 16 IU/L (ref 0–40)
Albumin/Globulin Ratio: 1.7 (ref 1.1–2.5)
Albumin: 4.5 g/dL (ref 3.5–5.5)
Alkaline Phosphatase: 87 IU/L (ref 39–117)
BUN/Creatinine Ratio: 14 (ref 9–20)
BUN: 11 mg/dL (ref 6–24)
Bilirubin Total: 1.5 mg/dL — ABNORMAL HIGH (ref 0.0–1.2)
CO2: 26 mmol/L (ref 18–29)
Calcium: 9.6 mg/dL (ref 8.7–10.2)
Chloride: 95 mmol/L — ABNORMAL LOW (ref 97–108)
Creatinine, Ser: 0.77 mg/dL (ref 0.76–1.27)
GFR calc Af Amer: 121 mL/min/{1.73_m2} (ref >59)
GFR calc non Af Amer: 104 mL/min/{1.73_m2} (ref >59)
Globulin, Total: 2.7 g/dL (ref 1.5–4.5)
Glucose: 167 mg/dL — ABNORMAL HIGH (ref 65–99)
Potassium: 4.4 mmol/L (ref 3.5–5.2)
Sodium: 137 mmol/L (ref 134–144)
Total Protein: 7.2 g/dL (ref 6.0–8.5)

## 2014-05-13 ENCOUNTER — Encounter: Payer: Self-pay | Admitting: Nurse Practitioner

## 2014-05-15 ENCOUNTER — Encounter: Payer: Self-pay | Admitting: Pharmacist

## 2014-05-15 ENCOUNTER — Ambulatory Visit (INDEPENDENT_AMBULATORY_CARE_PROVIDER_SITE_OTHER): Payer: BLUE CROSS/BLUE SHIELD | Admitting: Pharmacist

## 2014-05-15 VITALS — BP 134/80 | HR 78 | Ht 70.0 in | Wt 253.0 lb

## 2014-05-15 DIAGNOSIS — E119 Type 2 diabetes mellitus without complications: Secondary | ICD-10-CM

## 2014-05-15 MED ORDER — GLUCOSE BLOOD VI STRP
ORAL_STRIP | Status: DC
Start: 2014-05-15 — End: 2017-01-09

## 2014-05-15 MED ORDER — ONETOUCH DELICA LANCETS 33G MISC: Status: DC

## 2014-05-15 NOTE — Progress Notes (Signed)
Subjective:    Clayton [name] is a 54 y.o. male who presents for an initial evaluation of Type 2 diabetes mellitus.  He was diagnosed 05/09/2014.  Started metformin 500mg  BID. Current symptoms/problems include hyperglycemia and polydipsia and have been unchanged. Symptoms have been present for 1 month.  Known diabetic complications: none Cardiovascular risk factors: diabetes mellitus, dyslipidemia, hypertension, male gender, obesity (BMI >= 30 kg/m2), sedentary lifestyle and smoking/ tobacco exposure Current diabetic medications include oral agent (monotherapy): metformin (generic).   Eye exam current (within one year): yes Weight trend: stable Prior visit with dietician: no Current diet: in general, an "unhealthy" diet, drinks lots of soda and eats lots of bread Current exercise: none  Current monitoring regimen: none Home blood sugar records: none Any episodes of hypoglycemia? no  Is He on ACE inhibitor or angiotensin II receptor blocker?  Yes    losartan + HCTZ (Hyzaar)  The following portions of the patient's history were reviewed and updated as appropriate: allergies, current medications, past family history, past medical history, past social history, past surgical history and problem list.   Objective:    BP 134/80 mmHg  Pulse 78  Ht 5\' 10"  (1.778 m)  Wt 253 lb (114.76 kg)  BMI 36.30 kg/m2   Lab Review GLUCOSE (mg/dL)  Date Value  05/09/2014 167*  11/16/2012 194*   GLUCOSE, BLD (mg/dL)  Date Value  06/15/2012 125*   CO2  Date Value  05/09/2014 26 mmol/L  11/16/2012 25 mmol/L  06/15/2012 30 mEq/L   BUN (mg/dL)  Date Value  05/09/2014 11  11/16/2012 13  06/15/2012 17  11/06/2011 15   CREATININE (mg/dL)  Date Value  11/06/2011 0.9   CREAT (mg/dL)  Date Value  06/15/2012 0.77   CREATININE, SER (mg/dL)  Date Value  05/09/2014 0.77  11/16/2012 0.74*   Assessment:    Diabetes Mellitus type II, under inadequate control.    Plan:    1.  Rx  changes: none 2.  Education: Reviewed 'ABCs' of diabetes management (respective goals in parentheses):  A1C (<7), blood pressure (<130/80), and cholesterol (LDL <100). 3.  Patient is given One Touch Verio IQ glucometer and taught to use in office.  Discussed BG goals and targets.   Rx sent to his pharmacy for test strips and lancets.  Advised to check BG 1 to 2 times daily. 4.  Discussed CHO counting diet in depth.  Serving sizes disucssed.  Recommend 5o to 55 grams CHO per meal and 15 - 20 grams per snack.  Goal is to discontinue use of regular sodas and incease water intake.  5.  Increase physical activity - goals 30 minutes daily at least 5 days per week  6. Follow up: 2 months    Cherre Robins, PharmD, CPP, CDE

## 2014-05-15 NOTE — Patient Instructions (Signed)
Diabetes and Standards of Medical Care   Diabetes is complicated. You may find that your diabetes team includes a dietitian, nurse, diabetes educator, eye doctor, and more. To help everyone know what is going on and to help you get the care you deserve, the following schedule of care was developed to help keep you on track. Below are the tests, exams, vaccines, medicines, education, and plans you will need.  Blood Glucose Goals Prior to meals = 80 - 130 Within 2 hours of the start of a meal = less than 180  HbA1c test (goal is less than 7.0% - your last value was 8.0%) This test shows how well you have controlled your glucose over the past 2 to 3 months. It is used to see if your diabetes management plan needs to be adjusted.   It is performed at least 2 times a year if you are meeting treatment goals.  It is performed 4 times a year if therapy has changed or if you are not meeting treatment goals.  Blood pressure test  This test is performed at every routine medical visit. The goal is less than 140/90 mmHg for most people, but 130/80 mmHg in some cases. Ask your health care provider about your goal.  Dental exam  Follow up with the dentist regularly.  Eye exam  If you are diagnosed with type 1 diabetes as a child, get an exam upon reaching the age of 79 years or older and have had diabetes for 3 to 5 years. Yearly eye exams are recommended after that initial eye exam.  If you are diagnosed with type 1 diabetes as an adult, get an exam within 5 years of diagnosis and then yearly.  If you are diagnosed with type 2 diabetes, get an exam as soon as possible after the diagnosis and then yearly.  Foot care exam  Visual foot exams are performed at every routine medical visit. The exams check for cuts, injuries, or other problems with the feet.  A comprehensive foot exam should be done yearly. This includes visual inspection as well as assessing foot pulses and testing for loss of  sensation.  Check your feet nightly for cuts, injuries, or other problems with your feet. Tell your health care provider if anything is not healing.  Kidney function test (urine microalbumin)  This test is performed once a year.  Type 1 diabetes: The first test is performed 5 years after diagnosis.  Type 2 diabetes: The first test is performed at the time of diagnosis.  A serum creatinine and estimated glomerular filtration rate (eGFR) test is done once a year to assess the level of chronic kidney disease (CKD), if present.  Lipid profile (cholesterol, HDL, LDL, triglycerides)  Performed every 5 years for most people.  The goal for LDL is less than 100 mg/dL. If you are at high risk, the goal is less than 70 mg/dL.  The goal for HDL is 40 mg/dL to 50 mg/dL for men and 50 mg/dL to 60 mg/dL for women. An HDL cholesterol of 60 mg/dL or higher gives some protection against heart disease.  The goal for triglycerides is less than 150 mg/dL.  Influenza vaccine, pneumococcal vaccine, and hepatitis B vaccine  The influenza vaccine is recommended yearly.  The pneumococcal vaccine is generally given once in a lifetime. However, there are some instances when another vaccination is recommended. Check with your health care provider.  The hepatitis B vaccine is also recommended for adults with diabetes.  Diabetes self-management education  Education is recommended at diagnosis and ongoing as needed.  Treatment plan  Your treatment plan is reviewed at every medical visit.  Document Released: 01/02/2009 Document Revised: 11/07/2012 Document Reviewed: 08/07/2012 [employer] Patient Information 2014 [employer].   

## 2014-08-08 ENCOUNTER — Ambulatory Visit (INDEPENDENT_AMBULATORY_CARE_PROVIDER_SITE_OTHER): Payer: BLUE CROSS/BLUE SHIELD | Admitting: Pharmacist

## 2014-08-08 ENCOUNTER — Encounter: Payer: Self-pay | Admitting: Pharmacist

## 2014-08-08 VITALS — BP 124/78 | HR 76 | Ht 70.0 in | Wt 239.0 lb

## 2014-08-08 DIAGNOSIS — E785 Hyperlipidemia, unspecified: Secondary | ICD-10-CM | POA: Diagnosis not present

## 2014-08-08 DIAGNOSIS — E669 Obesity, unspecified: Secondary | ICD-10-CM

## 2014-08-08 DIAGNOSIS — I10 Essential (primary) hypertension: Secondary | ICD-10-CM

## 2014-08-08 DIAGNOSIS — E119 Type 2 diabetes mellitus without complications: Secondary | ICD-10-CM | POA: Diagnosis not present

## 2014-08-08 HISTORY — DX: Morbid (severe) obesity due to excess calories: E66.01

## 2014-08-08 LAB — POCT GLYCOSYLATED HEMOGLOBIN (HGB A1C): Hemoglobin A1C: 7.2

## 2014-08-08 MED ORDER — ASPIRIN 81 MG PO TBEC: 81.0000 mg | DELAYED_RELEASE_TABLET | Freq: Every day | ORAL | Status: AC

## 2014-08-08 NOTE — Progress Notes (Signed)
Subjective:    Clayton [name] is a 54 y.o. male who presents for follow up of Type 2 diabetes mellitus.  He was diagnosed 05/09/2014.  Started metformin 500mg  BID. Current symptoms/problems include none and have improved since our initial vitis 05/15/2014.  Clayton [name] has changed his diet greatly - decreased bread (was eating about 8 slices a day) and only 1 soda a week (was drinking 1-2 every day)  Known diabetic complications: none Cardiovascular risk factors: diabetes mellitus, dyslipidemia, hypertension, male gender, obesity (BMI >= 30 kg/m2), sedentary lifestyle and smoking/ tobacco exposure Current diabetic medications include oral agent (monotherapy): metformin (generic) 500mg  BID  Eye exam current (within one year): yes Weight trend: Decreased by 13# since 05/15/2014 Prior visit with dietician: no Prior visit with CDE:  yes Current diet: in general, a "healthy" diet  , has improved greatly Current exercise: none  Current monitoring regimen: none Home blood sugar records: none Any episodes of hypoglycemia? no  Is He on ACE inhibitor or angiotensin II receptor blocker?  Yes    losartan + HCTZ (Hyzaar)  The following portions of the patient's history were reviewed and updated as appropriate: allergies, current medications, past family history, past medical history, past social history, past surgical history and problem list.   Objective:    BP 124/78 mmHg  Pulse 76  Ht 5\' 10"  (1.778 m)  Wt 239 lb (108.41 kg)  BMI 34.29 kg/m2  Weight down 13# BMI down from 36.4 to 34.29 today  A1c today was 7.2% this is improved from 8.0% 05/09/2014  Lab Review GLUCOSE (mg/dL)  Date Value  05/09/2014 167*  11/16/2012 194*   GLUCOSE, BLD (mg/dL)  Date Value  06/15/2012 125*   CO2  Date Value  05/09/2014 26 mmol/L  11/16/2012 25 mmol/L  06/15/2012 30 mEq/L   BUN (mg/dL)  Date Value  05/09/2014 11  11/16/2012 13  06/15/2012 17  11/06/2011 15   CREATININE (mg/dL)   Date Value  11/06/2011 0.9   CREAT (mg/dL)  Date Value  06/15/2012 0.77   CREATININE, SER (mg/dL)  Date Value  05/09/2014 0.77  11/16/2012 0.74*   Assessment:    Diabetes Mellitus type II, under improved control.   HTN - at goal today Hyperlipidemia with elevated Tg and LDL Obesity - weight is improving  Plan:    1.  Rx changes: none Might consider discontinue trilipix / fenofibrate if Tg are improved with labs drawn today.  Patient reports that the copy for this has increased to $45/month. 2.  Education: Reviewed 'ABCs' of diabetes management (respective goals in parentheses):  A1C (<7), blood pressure (<130/80), and cholesterol (LDL <100). 3.  Patient  Advised to check BG QD or QOD.  Reviewed site selection due to patient having difficulty getting enough blood sample at times.  Tips for improved sampling discussed. 4.  Reviewed CHO counting   Continue with current changes in diet.  5.  Increase physical activity - goals 30 minutes daily at least 5 days per week  6.  Orders Placed This Encounter  Procedures  . CMP14+EGFR  . Lipid panel  . LDL Cholesterol, Direct  . POCT glycosylated hemoglobin (Hb A1C)     Follow up: 3 months    Cherre Robins, PharmD, CPP, CDE

## 2014-08-08 NOTE — Patient Instructions (Signed)
Continue with diet changes - great job! You have lost 13 lbs. Since 04/2014!  Start aspirin 81mg  take 1 tablet daily

## 2014-08-09 LAB — CMP14+EGFR
ALT: 14 IU/L (ref 0–44)
AST: 11 IU/L (ref 0–40)
Albumin/Globulin Ratio: 1.6 (ref 1.1–2.5)
Albumin: 4.6 g/dL (ref 3.5–5.5)
Alkaline Phosphatase: 65 IU/L (ref 39–117)
BUN/Creatinine Ratio: 20 (ref 9–20)
BUN: 16 mg/dL (ref 6–24)
Bilirubin Total: 0.6 mg/dL (ref 0.0–1.2)
CO2: 25 mmol/L (ref 18–29)
Calcium: 9.4 mg/dL (ref 8.7–10.2)
Chloride: 95 mmol/L — ABNORMAL LOW (ref 97–108)
Creatinine, Ser: 0.81 mg/dL (ref 0.76–1.27)
GFR calc Af Amer: 118 mL/min/{1.73_m2} (ref >59)
GFR calc non Af Amer: 102 mL/min/{1.73_m2} (ref >59)
Globulin, Total: 2.9 g/dL (ref 1.5–4.5)
Glucose: 131 mg/dL — ABNORMAL HIGH (ref 65–99)
Potassium: 4.5 mmol/L (ref 3.5–5.2)
Sodium: 138 mmol/L (ref 134–144)
Total Protein: 7.5 g/dL (ref 6.0–8.5)

## 2014-08-09 LAB — LIPID PANEL
Chol/HDL Ratio: 3.9 ratio units (ref 0.0–5.0)
Cholesterol, Total: 113 mg/dL (ref 100–199)
HDL: 29 mg/dL — ABNORMAL LOW (ref >39)
LDL Calculated: 61 mg/dL (ref 0–99)
Triglycerides: 114 mg/dL (ref 0–149)
VLDL Cholesterol Cal: 23 mg/dL (ref 5–40)

## 2014-08-09 LAB — LDL CHOLESTEROL, DIRECT: LDL Direct: 73 mg/dL (ref 0–99)

## 2014-08-11 ENCOUNTER — Telehealth: Payer: Self-pay | Admitting: Pharmacist

## 2014-08-11 NOTE — Telephone Encounter (Signed)
Triglycerides have improved and are at goal.  LDL at goal.  BMET OK - FBG a little high but patient working on better BG control (has T2DM) I recommend hold trilipix due to cost - recheck lipids in 6 weeks.  If triglycerides elevated then will consider a fenofibrate with lower cost to patient.  Continue all other medications and CHO counting diet.  Tried to call patient - LM on VM

## 2014-08-13 ENCOUNTER — Other Ambulatory Visit: Payer: Self-pay | Admitting: Pharmacist

## 2014-08-13 ENCOUNTER — Telehealth: Payer: Self-pay | Admitting: Pharmacist

## 2014-08-13 DIAGNOSIS — E1169 Type 2 diabetes mellitus with other specified complication: Secondary | ICD-10-CM

## 2014-08-13 DIAGNOSIS — E785 Hyperlipidemia, unspecified: Principal | ICD-10-CM

## 2014-08-13 NOTE — Telephone Encounter (Signed)
Left message with information about labs and rechecking in 6 weeks

## 2014-08-28 ENCOUNTER — Telehealth: Payer: Self-pay | Admitting: Pharmacist

## 2014-08-28 NOTE — Telephone Encounter (Signed)
Left message in May that he could hold Trilipix / fenofibrate.  Tried to call patient today but not answer - again left message that he could hold Trilipix / fenofibrate and to call if he had further questions.

## 2014-09-10 ENCOUNTER — Telehealth: Payer: Self-pay | Admitting: *Deleted

## 2014-09-11 MED ORDER — TADALAFIL 20 MG PO TABS: 20.0000 mg | ORAL_TABLET | Freq: Every day | ORAL | Status: DC | PRN

## 2014-09-11 NOTE — Telephone Encounter (Signed)
What is he taking this for?

## 2014-09-24 ENCOUNTER — Other Ambulatory Visit (INDEPENDENT_AMBULATORY_CARE_PROVIDER_SITE_OTHER): Payer: BLUE CROSS/BLUE SHIELD

## 2014-09-24 DIAGNOSIS — E785 Hyperlipidemia, unspecified: Secondary | ICD-10-CM

## 2014-09-24 DIAGNOSIS — E1169 Type 2 diabetes mellitus with other specified complication: Secondary | ICD-10-CM | POA: Diagnosis not present

## 2014-09-24 NOTE — Progress Notes (Signed)
Lab only 

## 2014-09-25 LAB — LIPID PANEL
Chol/HDL Ratio: 4.7 ratio units (ref 0.0–5.0)
Cholesterol, Total: 117 mg/dL (ref 100–199)
HDL: 25 mg/dL — ABNORMAL LOW (ref >39)
LDL Calculated: 57 mg/dL (ref 0–99)
Triglycerides: 174 mg/dL — ABNORMAL HIGH (ref 0–149)
VLDL Cholesterol Cal: 35 mg/dL (ref 5–40)

## 2014-09-25 LAB — LDL CHOLESTEROL, DIRECT: LDL Direct: 70 mg/dL (ref 0–99)

## 2014-11-10 ENCOUNTER — Other Ambulatory Visit: Payer: Self-pay | Admitting: Nurse Practitioner

## 2014-12-01 ENCOUNTER — Encounter: Payer: Self-pay | Admitting: Internal Medicine

## 2014-12-01 ENCOUNTER — Encounter: Payer: Self-pay | Admitting: Nurse Practitioner

## 2014-12-01 ENCOUNTER — Ambulatory Visit (INDEPENDENT_AMBULATORY_CARE_PROVIDER_SITE_OTHER): Payer: BLUE CROSS/BLUE SHIELD

## 2014-12-01 ENCOUNTER — Ambulatory Visit (INDEPENDENT_AMBULATORY_CARE_PROVIDER_SITE_OTHER): Payer: BLUE CROSS/BLUE SHIELD | Admitting: Nurse Practitioner

## 2014-12-01 VITALS — BP 124/76 | HR 60 | Temp 97.8°F | Ht 70.0 in | Wt 238.0 lb

## 2014-12-01 DIAGNOSIS — E785 Hyperlipidemia, unspecified: Secondary | ICD-10-CM

## 2014-12-01 DIAGNOSIS — K219 Gastro-esophageal reflux disease without esophagitis: Secondary | ICD-10-CM | POA: Diagnosis not present

## 2014-12-01 DIAGNOSIS — F329 Major depressive disorder, single episode, unspecified: Secondary | ICD-10-CM

## 2014-12-01 DIAGNOSIS — Z1211 Encounter for screening for malignant neoplasm of colon: Secondary | ICD-10-CM | POA: Diagnosis not present

## 2014-12-01 DIAGNOSIS — E119 Type 2 diabetes mellitus without complications: Secondary | ICD-10-CM | POA: Diagnosis not present

## 2014-12-01 DIAGNOSIS — Z6834 Body mass index (BMI) 34.0-34.9, adult: Secondary | ICD-10-CM | POA: Diagnosis not present

## 2014-12-01 DIAGNOSIS — F32A Depression, unspecified: Secondary | ICD-10-CM

## 2014-12-01 DIAGNOSIS — I10 Essential (primary) hypertension: Secondary | ICD-10-CM

## 2014-12-01 LAB — POCT GLYCOSYLATED HEMOGLOBIN (HGB A1C): Hemoglobin A1C: 7

## 2014-12-01 MED ORDER — PANTOPRAZOLE SODIUM 40 MG PO TBEC: DELAYED_RELEASE_TABLET | ORAL | Status: DC

## 2014-12-01 MED ORDER — ATORVASTATIN CALCIUM 40 MG PO TABS: ORAL_TABLET | ORAL | Status: DC

## 2014-12-01 MED ORDER — ESCITALOPRAM OXALATE 10 MG PO TABS: ORAL_TABLET | ORAL | Status: DC

## 2014-12-01 MED ORDER — LOSARTAN POTASSIUM-HCTZ 100-25 MG PO TABS: ORAL_TABLET | ORAL | Status: DC

## 2014-12-01 MED ORDER — METFORMIN HCL 500 MG PO TABS: 500.0000 mg | ORAL_TABLET | Freq: Two times a day (BID) | ORAL | Status: DC

## 2014-12-01 NOTE — Patient Instructions (Signed)
Colonoscopy  A colonoscopy is an exam to look at the entire large intestine (colon). This exam can help find problems such as tumors, polyps, inflammation, and areas of bleeding. The exam takes about 1 hour.   LET YOUR HEALTH CARE PROVIDER KNOW ABOUT:   · Any allergies you have.  · All medicines you are taking, including vitamins, herbs, eye drops, creams, and over-the-counter medicines.  · Previous problems you or members of your family have had with the use of anesthetics.  · Any blood disorders you have.  · Previous surgeries you have had.  · Medical conditions you have.  RISKS AND COMPLICATIONS   Generally, this is a safe procedure. However, as with any procedure, complications can occur. Possible complications include:  · Bleeding.  · Tearing or rupture of the colon wall.  · Reaction to medicines given during the exam.  · Infection (rare).  BEFORE THE PROCEDURE   · Ask your health care provider about changing or stopping your regular medicines.  · You may be prescribed an oral bowel prep. This involves drinking a large amount of medicated liquid, starting the day before your procedure. The liquid will cause you to have multiple loose stools until your stool is almost clear or light green. This cleans out your colon in preparation for the procedure.  · Do not eat or drink anything else once you have started the bowel prep, unless your health care provider tells you it is safe to do so.  · Arrange for someone to drive you home after the procedure.  PROCEDURE   · You will be given medicine to help you relax (sedative).  · You will lie on your side with your knees bent.  · A long, flexible tube with a light and camera on the end (colonoscope) will be inserted through the rectum and into the colon. The camera sends video back to a computer screen as it moves through the colon. The colonoscope also releases carbon dioxide gas to inflate the colon. This helps your health care provider see the area better.  · During  the exam, your health care provider may take a small tissue sample (biopsy) to be examined under a microscope if any abnormalities are found.  · The exam is finished when the entire colon has been viewed.  AFTER THE PROCEDURE   · Do not drive for 24 hours after the exam.  · You may have a small amount of blood in your stool.  · You may pass moderate amounts of gas and have mild abdominal cramping or bloating. This is caused by the gas used to inflate your colon during the exam.  · Ask when your test results will be ready and how you will get your results. Make sure you get your test results.  Document Released: 03/04/2000 Document Revised: 12/26/2012 Document Reviewed: 11/12/2012  [employer]® Patient Information ©2015 [employer]. This information is not intended to replace advice given to you by your health care provider. Make sure you discuss any questions you have with your health care provider.

## 2014-12-01 NOTE — Progress Notes (Signed)
Subjective:    Patient ID: Clayton [name], male    DOB: 04/27/1960, 54 y.o.   MRN: [id number]   Patient here today for follow up of chronic medical problems. Newly diagnosed diabetic at last visit. He never went and picked up metformin, so he has not been taking. Was suppose to make appointment with clinical pharmacist but never did.   Hypertension This is a chronic problem. The current episode started more than 1 year ago. The problem has been waxing and waning since onset. Pertinent negatives include no chest pain, headaches, neck pain, palpitations or shortness of breath. Risk factors for coronary artery disease include dyslipidemia, family history and obesity. Past treatments include angiotensin blockers and diuretics. The current treatment provides moderate improvement. Compliance problems include diet and exercise.   Hyperlipidemia This is a chronic problem. The current episode started more than 1 year ago. The problem is controlled. Recent lipid tests were reviewed and are normal. He has no history of diabetes, hypothyroidism or obesity. Pertinent negatives include no chest pain or shortness of breath. Current antihyperlipidemic treatment includes statins and ezetimibe. The current treatment provides moderate improvement of lipids. Compliance problems include adherence to diet and adherence to exercise.  Risk factors for coronary artery disease include dyslipidemia, hypertension and male sex.  Diabetes He presents for his follow-up diabetic visit. He has type 2 diabetes mellitus. No MedicAlert identification noted. Pertinent negatives for hypoglycemia include no headaches. There are no diabetic associated symptoms. Pertinent negatives for diabetes include no chest pain. There are no hypoglycemic complications. Symptoms are stable. There are no diabetic complications. Risk factors for coronary artery disease include diabetes mellitus, dyslipidemia, hypertension, male sex, obesity and sedentary  lifestyle. Current diabetic treatment includes oral agent (monotherapy). He is compliant with treatment all of the time. His weight is stable. He is following a generally unhealthy diet. When asked about meal planning, he reported none. He has not had a previous visit with a dietitian. His dinner blood glucose is taken between 5-6 pm. His dinner blood glucose range is generally 110-130 mg/dl. (Does not check blood sugars at home) An ACE inhibitor/angiotensin II receptor blocker is being taken. He does not see a podiatrist.Eye exam is current.  Depression Lexapro working well to keep him calm and from worrying so much- No side effects, no complaints GERD Currently on protonix- keeps symptoms under control, Reports he has not taken this in awhile, but no noted symptoms  Review of Systems  Constitutional: Negative.   HENT: Negative.   Respiratory: Negative for shortness of breath.   Cardiovascular: Negative for chest pain and palpitations.  Musculoskeletal: Negative for neck pain.  Neurological: Negative for headaches.  Psychiatric/Behavioral: Negative.   All other systems reviewed and are negative.      Objective:   Physical Exam  Constitutional: He is oriented to person, place, and time. He appears well-developed and well-nourished.  HENT:  Head: Normocephalic.  Right Ear: External ear normal.  Left Ear: External ear normal.  Nose: Nose normal.  Mouth/Throat: Oropharynx is clear and moist.  Eyes: EOM are normal. Pupils are equal, round, and reactive to light.  Neck: Normal range of motion. Neck supple. No thyromegaly present.  Cardiovascular: Normal rate, regular rhythm, normal heart sounds and intact distal pulses.   No murmur heard. Pulmonary/Chest: Effort normal and breath sounds normal. He has no wheezes. He has no rales.  Abdominal: Soft. Bowel sounds are normal.  Musculoskeletal: Normal range of motion.  Neurological: He is alert and oriented to  person, place, and time.  Skin:  Skin is warm and dry.  Psychiatric: He has a normal mood and affect. His behavior is normal. Judgment and thought content normal.   BP 124/76 mmHg  Pulse 60  Temp(Src) 97.8 F (36.6 C) (Oral)  Ht $R'5\' 10"'zF$  (1.778 m)  Wt 238 lb (107.956 kg)  BMI 34.15 kg/m2   Results for orders placed or performed in visit on 12/01/14  POCT glycosylated hemoglobin (Hb A1C)  Result Value Ref Range   Hemoglobin A1C 7.0     EKG-sinus bradycardia- Mary-Margaret Hassell Done, FNP  cardia   Chest X-ray- no cardiopulmonary- Preliminary reading by Ronnald Collum, FNP  [facility]      Assessment & Plan:  1. Type 2 diabetes mellitus without complication Continue to check CBGs at home. Carb counting - POCT glycosylated hemoglobin (Hb A1C) - metFORMIN (GLUCOPHAGE) 500 MG tablet; Take 1 tablet (500 mg total) by mouth 2 (two) times daily with a meal.  Dispense: 60 tablet; Refill: 5  2. Hyperlipidemia with target LDL less than 100 Low fat diet - Lipid panel - atorvastatin (LIPITOR) 40 MG tablet; TAKE ONE (1) TABLET EACH DAY  Dispense: 30 tablet; Refill: 5  3. Essential hypertension No added salt to diet, continue with diet and exercise - CMP14+EGFR - losartan-hydrochlorothiazide (HYZAAR) 100-25 MG per tablet; TAKE ONE (1) TABLET EACH DAY  Dispense: 30 tablet; Refill: 5 - DG Chest 2 View; Future - EKG 12-Lead  4. Essential hypertension, benign Continue with current medications  5. Gastroesophageal reflux disease without esophagitis Avoid spicy foods, and eating 2 hours before bedtime - pantoprazole (PROTONIX) 40 MG tablet; TAKE ONE (1) TABLET EACH DAY  Dispense: 30 tablet; Refill: 5  6. Depression Continue with medications - escitalopram (LEXAPRO) 10 MG tablet; TAKE ONE (1) TABLET EACH DAY  Dispense: 30 tablet; Refill: 0  7. BMI 34.0-34.9,adult Diet and exercise  Referral to GI for colonscopy Continue all meds Labs pending Health Maintenance reviewed Diet and exercise encouraged RTO 3  months  Mary-Margaret Hassell Done, FNP

## 2014-12-02 LAB — CMP14+EGFR
ALT: 21 IU/L (ref 0–44)
AST: 13 IU/L (ref 0–40)
Albumin/Globulin Ratio: 1.8 (ref 1.1–2.5)
Albumin: 4.4 g/dL (ref 3.5–5.5)
Alkaline Phosphatase: 88 IU/L (ref 39–117)
BUN/Creatinine Ratio: 19 (ref 9–20)
BUN: 15 mg/dL (ref 6–24)
Bilirubin Total: 0.6 mg/dL (ref 0.0–1.2)
CO2: 24 mmol/L (ref 18–29)
Calcium: 9.2 mg/dL (ref 8.7–10.2)
Chloride: 102 mmol/L (ref 97–108)
Creatinine, Ser: 0.81 mg/dL (ref 0.76–1.27)
GFR calc Af Amer: 118 mL/min/{1.73_m2} (ref >59)
GFR calc non Af Amer: 102 mL/min/{1.73_m2} (ref >59)
Globulin, Total: 2.4 g/dL (ref 1.5–4.5)
Glucose: 161 mg/dL — ABNORMAL HIGH (ref 65–99)
Potassium: 4 mmol/L (ref 3.5–5.2)
Sodium: 142 mmol/L (ref 134–144)
Total Protein: 6.8 g/dL (ref 6.0–8.5)

## 2014-12-02 LAB — LIPID PANEL
Chol/HDL Ratio: 3.4 ratio units (ref 0.0–5.0)
Cholesterol, Total: 100 mg/dL (ref 100–199)
HDL: 29 mg/dL — ABNORMAL LOW (ref >39)
LDL Calculated: 50 mg/dL (ref 0–99)
Triglycerides: 105 mg/dL (ref 0–149)
VLDL Cholesterol Cal: 21 mg/dL (ref 5–40)

## 2015-01-15 ENCOUNTER — Other Ambulatory Visit: Payer: Self-pay | Admitting: *Deleted

## 2015-01-15 MED ORDER — TADALAFIL 20 MG PO TABS: 20.0000 mg | ORAL_TABLET | Freq: Every day | ORAL | Status: DC | PRN

## 2015-01-16 NOTE — Telephone Encounter (Signed)
Pt aware refill sent to pharmacy 

## 2015-01-17 ENCOUNTER — Other Ambulatory Visit: Payer: Self-pay | Admitting: Nurse Practitioner

## 2015-01-17 MED ORDER — SILDENAFIL CITRATE 20 MG PO TABS: 20.0000 mg | ORAL_TABLET | Freq: Three times a day (TID) | ORAL | Status: DC

## 2015-02-02 ENCOUNTER — Encounter: Payer: Self-pay | Admitting: Family Medicine

## 2015-02-02 ENCOUNTER — Ambulatory Visit (INDEPENDENT_AMBULATORY_CARE_PROVIDER_SITE_OTHER): Payer: BLUE CROSS/BLUE SHIELD | Admitting: Family Medicine

## 2015-02-02 ENCOUNTER — Ambulatory Visit (AMBULATORY_SURGERY_CENTER): Payer: Self-pay

## 2015-02-02 VITALS — Ht 70.0 in | Wt 246.8 lb

## 2015-02-02 VITALS — BP 133/86 | HR 87 | Temp 98.4°F | Ht 70.0 in | Wt 247.0 lb

## 2015-02-02 DIAGNOSIS — H6122 Impacted cerumen, left ear: Secondary | ICD-10-CM | POA: Diagnosis not present

## 2015-02-02 DIAGNOSIS — Z1211 Encounter for screening for malignant neoplasm of colon: Secondary | ICD-10-CM

## 2015-02-02 DIAGNOSIS — L03221 Cellulitis of neck: Secondary | ICD-10-CM

## 2015-02-02 MED ORDER — SULFAMETHOXAZOLE-TRIMETHOPRIM 800-160 MG PO TABS: 1.0000 | ORAL_TABLET | Freq: Two times a day (BID) | ORAL | Status: DC

## 2015-02-02 NOTE — Progress Notes (Signed)
No allergies to eggs or soy No past problems with anesthesia No diet/weight loss meds No home oxygen  No internet; refused emmi

## 2015-02-02 NOTE — Progress Notes (Signed)
BP 133/86 mmHg  Pulse 87  Temp(Src) 98.4 F (36.9 C) (Oral)  Ht $R'5\' 10"'CT$  (1.778 m)  Wt 247 lb (112.038 kg)  BMI 35.44 kg/m2   Subjective:    Patient ID: Clayton [name], male    DOB: 10/22/1960, 54 y.o.   MRN: [id number]  HPI: Clayton [name] is a 54 y.o. male presenting on 02/02/2015 for Knot behind left ear   HPI Lump behind Left Ear Patient had what he thought was a cyst behind his left ear but over the past 3 days and has become more red and swollen and gotten larger and now he feels like he has swelling and pain all up that side of his head. He denies any fevers or chills. He does complain of ear blockage and muffled because of wax in his ear that has been unable to be removed previously. He denies any sinus or nasal symptoms. He has had cysts before but never anything swollen red and infected like this one is.  Relevant past medical, surgical, family and social history reviewed and updated as indicated. Interim medical history since our last visit reviewed. Allergies and medications reviewed and updated.  Review of Systems  Constitutional: Negative for fever and chills.  HENT: Negative for ear discharge and ear pain.   Eyes: Negative for discharge and visual disturbance.  Respiratory: Negative for shortness of breath and wheezing.   Cardiovascular: Negative for chest pain and leg swelling.  Gastrointestinal: Negative for abdominal pain, diarrhea and constipation.  Genitourinary: Negative for difficulty urinating.  Musculoskeletal: Negative for back pain and gait problem.  Skin: Positive for color change and rash.  Neurological: Negative for dizziness, syncope, light-headedness and headaches.  All other systems reviewed and are negative.   Per HPI unless specifically indicated above     Medication List       This list is accurate as of: 02/02/15  3:41 PM.  Always use your most recent med list.               aspirin 81 MG EC tablet  Take 1 tablet (81 mg  total) by mouth daily. Swallow whole.     atorvastatin 40 MG tablet  Commonly known as:  LIPITOR  TAKE ONE (1) TABLET EACH DAY     escitalopram 10 MG tablet  Commonly known as:  LEXAPRO  TAKE ONE (1) TABLET EACH DAY     glucose blood test strip  Use to check BG 1 to 2 times daily.  One Touch Verio IQ test strips.  DX:  E11.9     losartan-hydrochlorothiazide 100-25 MG tablet  Commonly known as:  HYZAAR  TAKE ONE (1) TABLET EACH DAY     metFORMIN 500 MG tablet  Commonly known as:  GLUCOPHAGE  Take 1 tablet (500 mg total) by mouth 2 (two) times daily with a meal.     ONETOUCH DELICA LANCETS 33I Misc  Use to check BG once or twice daily.  Dx:  E11.9     sulfamethoxazole-trimethoprim 800-160 MG tablet  Commonly known as:  BACTRIM DS  Take 1 tablet by mouth 2 (two) times daily.           Objective:    BP 133/86 mmHg  Pulse 87  Temp(Src) 98.4 F (36.9 C) (Oral)  Ht $R'5\' 10"'Po$  (1.778 m)  Wt 247 lb (112.038 kg)  BMI 35.44 kg/m2  Wt Readings from Last 3 Encounters:  02/02/15 247 lb (112.038 kg)  02/02/15 246 lb 12.8 oz (  111.948 kg)  12/01/14 238 lb (107.956 kg)    Physical Exam  Constitutional: He is oriented to person, place, and time. He appears well-developed and well-nourished. No distress.  HENT:  Right Ear: Hearing, tympanic membrane and ear canal normal.  Left Ear: External ear normal. Decreased hearing (cerumen impaction pushed down to tympanic membrane and blocking view of tympanic membrane) is noted.  Nose: Nose normal.  Mouth/Throat: Uvula is midline, oropharynx is clear and moist and mucous membranes are normal.  Eyes: Conjunctivae and EOM are normal. Pupils are equal, round, and reactive to light. Right eye exhibits no discharge. No scleral icterus.  Cardiovascular: Normal rate, regular rhythm, normal heart sounds and intact distal pulses.   No murmur heard. Pulmonary/Chest: Effort normal and breath sounds normal. No respiratory distress. He has no wheezes.    Musculoskeletal: Normal range of motion. He exhibits no edema.  Neurological: He is alert and oriented to person, place, and time. Coordination normal.  Skin: Skin is warm and dry. No rash noted. He is not diaphoretic.  Psychiatric: He has a normal mood and affect. His behavior is normal.  Vitals reviewed.   Results for orders placed or performed in visit on 12/01/14  CMP14+EGFR  Result Value Ref Range   Glucose 161 (H) 65 - 99 mg/dL   BUN 15 6 - 24 mg/dL   Creatinine, Ser 0.81 0.76 - 1.27 mg/dL   GFR calc non Af Amer 102 >59 mL/min/1.73   GFR calc Af Amer 118 >59 mL/min/1.73   BUN/Creatinine Ratio 19 9 - 20   Sodium 142 134 - 144 mmol/L   Potassium 4.0 3.5 - 5.2 mmol/L   Chloride 102 97 - 108 mmol/L   CO2 24 18 - 29 mmol/L   Calcium 9.2 8.7 - 10.2 mg/dL   Total Protein 6.8 6.0 - 8.5 g/dL   Albumin 4.4 3.5 - 5.5 g/dL   Globulin, Total 2.4 1.5 - 4.5 g/dL   Albumin/Globulin Ratio 1.8 1.1 - 2.5   Bilirubin Total 0.6 0.0 - 1.2 mg/dL   Alkaline Phosphatase 88 39 - 117 IU/L   AST 13 0 - 40 IU/L   ALT 21 0 - 44 IU/L  Lipid panel  Result Value Ref Range   Cholesterol, Total 100 100 - 199 mg/dL   Triglycerides 105 0 - 149 mg/dL   HDL 29 (L) >39 mg/dL   VLDL Cholesterol Cal 21 5 - 40 mg/dL   LDL Calculated 50 0 - 99 mg/dL   Chol/HDL Ratio 3.4 0.0 - 5.0 ratio units  POCT glycosylated hemoglobin (Hb A1C)  Result Value Ref Range   Hemoglobin A1C 7.0       Assessment & Plan:   Problem List Items Addressed This Visit    None    Visit Diagnoses    Cellulitis of neck    -  Primary    Behind the left ear, cellulitis, difficult to tell if there is any fluctuance because of how tight the region is. Send Bactrim and see back in a week    Relevant Medications    sulfamethoxazole-trimethoprim (BACTRIM DS) 800-160 MG tablet    Cerumen impaction, left        Persistent, unable to clear here, will send to ENT    Relevant Orders    Ambulatory referral to ENT        Follow up  plan: Return in about 1 week (around 02/09/2015), or if symptoms worsen or fail to improve.  Counseling provided for all  of the vaccine components Orders Placed This Encounter  Procedures  . Ambulatory referral to ENT    Caryl Pina, MD [facility] 02/02/2015, 3:41 PM

## 2015-02-09 ENCOUNTER — Ambulatory Visit: Payer: BLUE CROSS/BLUE SHIELD | Admitting: Family Medicine

## 2015-02-10 ENCOUNTER — Encounter: Payer: Self-pay | Admitting: Nurse Practitioner

## 2015-02-11 ENCOUNTER — Other Ambulatory Visit: Payer: Self-pay | Admitting: Nurse Practitioner

## 2015-02-16 ENCOUNTER — Ambulatory Visit (AMBULATORY_SURGERY_CENTER): Payer: BLUE CROSS/BLUE SHIELD | Admitting: Internal Medicine

## 2015-02-16 ENCOUNTER — Encounter: Payer: Self-pay | Admitting: Internal Medicine

## 2015-02-16 VITALS — BP 123/75 | HR 63 | Temp 97.7°F | Resp 16 | Ht 70.0 in | Wt 246.0 lb

## 2015-02-16 DIAGNOSIS — D123 Benign neoplasm of transverse colon: Secondary | ICD-10-CM | POA: Diagnosis not present

## 2015-02-16 DIAGNOSIS — Z860101 Personal history of adenomatous and serrated colon polyps: Secondary | ICD-10-CM

## 2015-02-16 DIAGNOSIS — D129 Benign neoplasm of anus and anal canal: Secondary | ICD-10-CM

## 2015-02-16 DIAGNOSIS — K621 Rectal polyp: Secondary | ICD-10-CM

## 2015-02-16 DIAGNOSIS — Z1211 Encounter for screening for malignant neoplasm of colon: Secondary | ICD-10-CM | POA: Diagnosis present

## 2015-02-16 DIAGNOSIS — Z86010 Personal history of colonic polyps: Secondary | ICD-10-CM

## 2015-02-16 DIAGNOSIS — D124 Benign neoplasm of descending colon: Secondary | ICD-10-CM | POA: Diagnosis not present

## 2015-02-16 DIAGNOSIS — D128 Benign neoplasm of rectum: Secondary | ICD-10-CM

## 2015-02-16 HISTORY — DX: Personal history of colonic polyps: Z86.010

## 2015-02-16 HISTORY — DX: Personal history of adenomatous and serrated colon polyps: Z86.0101

## 2015-02-16 MED ORDER — SODIUM CHLORIDE 0.9 % IV SOLN: 500.0000 mL | INTRAVENOUS | Status: DC

## 2015-02-16 NOTE — Progress Notes (Signed)
Called to room to assist during endoscopic procedure.  Patient ID and intended procedure confirmed with present staff. Received instructions for my participation in the procedure from the performing physician.  

## 2015-02-16 NOTE — Progress Notes (Signed)
Stable to RR 

## 2015-02-16 NOTE — Patient Instructions (Addendum)
Discharge instructions given. Handouts on polyps and diverticulosis. Resume previous medications. YOU HAD AN ENDOSCOPIC PROCEDURE TODAY AT THE [facility]:   Refer to the procedure report that was given to you for any specific questions about what was found during the examination.  If the procedure report does not answer your questions, please call your gastroenterologist to clarify.  If you requested that your care partner not be given the details of your procedure findings, then the procedure report has been included in a sealed envelope for you to review at your convenience later.  YOU SHOULD EXPECT: Some feelings of bloating in the abdomen. Passage of more gas than usual.  Walking can help get rid of the air that was put into your GI tract during the procedure and reduce the bloating. If you had a lower endoscopy (such as a colonoscopy or flexible sigmoidoscopy) you may notice spotting of blood in your stool or on the toilet paper. If you underwent a bowel prep for your procedure, you may not have a normal bowel movement for a few days.  Please Note:  You might notice some irritation and congestion in your nose or some drainage.  This is from the oxygen used during your procedure.  There is no need for concern and it should clear up in a day or so.  SYMPTOMS TO REPORT IMMEDIATELY:   Following lower endoscopy (colonoscopy or flexible sigmoidoscopy):  Excessive amounts of blood in the stool  Significant tenderness or worsening of abdominal pains  Swelling of the abdomen that is new, acute  Fever of 100F or higher   For urgent or emergent issues, a gastroenterologist can be reached at any hour by calling [phone].   DIET: Your first meal following the procedure should be a small meal and then it is ok to progress to your normal diet. Heavy or fried foods are harder to digest and may make you feel nauseous or bloated.  Likewise, meals heavy in dairy and vegetables can  increase bloating.  Drink plenty of fluids but you should avoid alcoholic beverages for 24 hours.  ACTIVITY:  You should plan to take it easy for the rest of today and you should NOT DRIVE or use heavy machinery until tomorrow (because of the sedation medicines used during the test).    FOLLOW UP: Our staff will call the number listed on your records the next business day following your procedure to check on you and address any questions or concerns that you may have regarding the information given to you following your procedure. If we do not reach you, we will leave a message.  However, if you are feeling well and you are not experiencing any problems, there is no need to return our call.  We will assume that you have returned to your regular daily activities without incident.  If any biopsies were taken you will be contacted by phone or by letter within the next 1-3 weeks.  Please call us at [phone] if you have not heard about the biopsies in 3 weeks.    SIGNATURES/CONFIDENTIALITY: You and/or your care partner have signed paperwork which will be entered into your electronic medical record.  These signatures attest to the fact that that the information above on your After Visit Summary has been reviewed and is understood.  Full responsibility of the confidentiality of this discharge information lies with you and/or your care-partner. 

## 2015-02-16 NOTE — Op Note (Addendum)
[place]  [address] [place], [id number]   COLONOSCOPY PROCEDURE REPORT  PATIENT: [name], Clayton  MR#: [id number] BIRTHDATE: 1960-08-17 , 53  yrs. old GENDER: male ENDOSCOPIST: Gatha Mayer, MD, [facility] PROCEDURE DATE:  02/16/2015 PROCEDURE:   Colonoscopy, screening and Colonoscopy with snare polypectomy First Screening Colonoscopy - Avg.  risk and is 50 yrs.  old or older Yes.  Prior Negative Screening - Now for repeat screening. N/A  History of Adenoma - Now for follow-up colonoscopy & has been > or = to 3 yrs.  N/A  Polyps removed today? Yes ASA CLASS:   Class II INDICATIONS:Screening for colonic neoplasia and Colorectal Neoplasm Risk Assessment for this procedure is average risk. MEDICATIONS: Propofol 500 mg IV, Monitored anesthesia care, and Lidocaine 40 mg IV  DESCRIPTION OF PROCEDURE:   After the risks benefits and alternatives of the procedure were thoroughly explained, informed consent was obtained.  The digital rectal exam revealed no abnormalities of the rectum, revealed no prostatic nodules, and revealed the prostate was not enlarged.   The LB [id number] endoscope was introduced through the anus and advanced to the cecum, which was identified by both the appendix and ileocecal valve. No adverse events experienced.   The quality of the prep was excellent.  (MiraLax was used)  The instrument was then slowly withdrawn as the colon was fully examined. Estimated blood loss is zero unless otherwise noted in this procedure report.  COLON FINDINGS: Three polypoid shaped sessile polyps ranging from 3 to 40mm in size were found in the distal transverse colon. Polypectomies were performed with a cold snare.  The resection was complete, the polyp tissue was completely retrieved and sent to histology.   A polypoid shaped sessile polyp measuring 4 mm in size was found in the rectum.  A polypectomy was performed with a cold snare.  The resection was  complete, the polyp tissue was completely retrieved and sent to histology.   There was moderate diverticulosis noted in the left colon. 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  The examination was otherwise normal.  Retroflexed views revealed no abnormalities. The time to cecum = 4.6 Withdrawal time = 17.8   The scope was withdrawn and the procedure completed. COMPLICATIONS: There were no immediate complications.  ENDOSCOPIC IMPRESSION: 1.   Three sessile polyps ranging from 3 to 62mm in size were found in the distal transverse colon; polypectomies were performed with a cold snare 2.   Sessile polyp was found in the rectum; polypectomy was performed with a cold snare 3.   Moderate diverticulosis was noted in the left colon 4. 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   The examination was otherwise normal  RECOMMENDATIONS: Timing of repeat colonoscopy will be determined by pathology findings.  eSigned:  Gatha Mayer, MD, [facility] 02/16/2015 9:57 AM Revised: 02/16/2015 9:57 AM  cc: Chevis Pretty NP and The Patient   PATIENT NAME:  Clayton, [name] MR#: [id number]

## 2015-02-17 ENCOUNTER — Telehealth: Payer: Self-pay | Admitting: *Deleted

## 2015-02-17 NOTE — Telephone Encounter (Signed)
  Follow up Call-  Call back number 02/16/2015  Post procedure Call Back phone  # [phone] cell  Permission to leave phone message Yes     Patient questions:  Do you have a fever, pain , or abdominal swelling? No. Pain Score  0 *  Have you tolerated food without any problems? Yes.    Have you been able to return to your normal activities? Yes.    Do you have any questions about your discharge instructions: Diet   No. Medications  No. Follow up visit  No.  Do you have questions or concerns about your Care? No.  Actions: * If pain score is 4 or above: No action needed, pain <4.

## 2015-02-24 ENCOUNTER — Encounter: Payer: Self-pay | Admitting: Internal Medicine

## 2015-02-24 ENCOUNTER — Encounter: Payer: Self-pay | Admitting: Family Medicine

## 2015-02-24 ENCOUNTER — Ambulatory Visit: Payer: BLUE CROSS/BLUE SHIELD | Admitting: Family Medicine

## 2015-02-24 VITALS — BP 132/73 | HR 72 | Temp 98.0°F | Ht 70.0 in | Wt 251.6 lb

## 2015-02-24 DIAGNOSIS — Z86010 Personal history of colonic polyps: Secondary | ICD-10-CM

## 2015-02-24 DIAGNOSIS — Z0000 Encounter for general adult medical examination without abnormal findings: Secondary | ICD-10-CM

## 2015-02-24 DIAGNOSIS — Z024 Encounter for examination for driving license: Secondary | ICD-10-CM

## 2015-02-24 LAB — POCT URINALYSIS DIPSTICK
Bilirubin, UA: NEGATIVE
Blood, UA: NEGATIVE
Glucose, UA: NEGATIVE
Ketones, UA: NEGATIVE
Leukocytes, UA: NEGATIVE
Nitrite, UA: NEGATIVE
Protein, UA: NEGATIVE
Spec Grav, UA: 1.01
Urobilinogen, UA: NEGATIVE
pH, UA: 6

## 2015-02-24 NOTE — Progress Notes (Signed)
Subjective:  Patient ID: Clayton [name], male    DOB: 1960-05-30  Age: 54 y.o. MRN: [id number]  CC: DOT PE   HPI Clayton [name] presents for DOT exam. No concerns today.  History Clayton [name] has a past medical history of Hypertension; High cholesterol; Anxiety; Depression; GERD (gastroesophageal reflux disease); Testosterone deficiency; Diabetes mellitus without complication (Clayton [name]); Sleep apnea; and Allergy.   He has past surgical history that includes Anterior cruciate ligament repair.   His family history includes Cancer in his mother; Diabetes in his father and paternal grandfather; Hypertension in his father. There is no history of Colon cancer, Esophageal cancer, Rectal cancer, Stomach cancer, Prostate cancer, or Pancreatic cancer.He reports that he has been smoking Cigarettes.  He has a 17.5 pack-year smoking history. His smokeless tobacco use includes Snuff. He reports that he drinks alcohol. He reports that he does not use illicit drugs.  Outpatient Prescriptions Prior to Visit  Medication Sig Dispense Refill  . aspirin 81 MG EC tablet Take 1 tablet (81 mg total) by mouth daily. Swallow whole. 30 tablet 12  . escitalopram (LEXAPRO) 10 MG tablet TAKE ONE (1) TABLET EACH DAY 30 tablet 0  . glucose blood test strip Use to check BG 1 to 2 times daily.  One Touch Verio IQ test strips.  DX:  E11.9 100 each 2  . losartan-hydrochlorothiazide (HYZAAR) 100-25 MG per tablet TAKE ONE (1) TABLET EACH DAY 30 tablet 5  . metFORMIN (GLUCOPHAGE) 500 MG tablet Take 1 tablet (500 mg total) by mouth 2 (two) times daily with a meal. 60 tablet 5  . ONETOUCH DELICA LANCETS 99991111 MISC Use to check BG once or twice daily.  Dx:  E11.9 100 each 2  . atorvastatin (LIPITOR) 40 MG tablet TAKE ONE (1) TABLET EACH DAY (Patient not taking: Reported on 02/24/2015) 30 tablet 5   No facility-administered medications prior to visit.    ROS Review of Systems  Constitutional: Negative for fever, chills, diaphoresis,  activity change, appetite change, fatigue and unexpected weight change.  HENT: Negative for congestion, ear pain, hearing loss, postnasal drip, rhinorrhea, sore throat, tinnitus and trouble swallowing.   Eyes: Negative for photophobia, pain, discharge, redness and visual disturbance.  Respiratory: Negative for apnea, cough, choking, chest tightness, shortness of breath, wheezing and stridor.   Cardiovascular: Negative for chest pain, palpitations and leg swelling.  Gastrointestinal: Negative for nausea, vomiting, abdominal pain, diarrhea, constipation, blood in stool and abdominal distention.  Endocrine: Negative for cold intolerance, heat intolerance, polydipsia, polyphagia and polyuria.  Genitourinary: Negative for dysuria, urgency, frequency, hematuria, flank pain, enuresis, difficulty urinating and genital sores.  Musculoskeletal: Negative for joint swelling and arthralgias.  Skin: Negative for color change, rash and wound.  Allergic/Immunologic: Negative for immunocompromised state.  Neurological: Negative for dizziness, tremors, seizures, syncope, facial asymmetry, speech difficulty, weakness, light-headedness, numbness and headaches.  Hematological: Does not bruise/bleed easily.  Psychiatric/Behavioral: Negative for suicidal ideas, hallucinations, behavioral problems, confusion, sleep disturbance, dysphoric mood, decreased concentration and agitation. The patient is not nervous/anxious and is not hyperactive.     Objective:  BP 132/73 mmHg  Pulse 72  Temp(Src) 98 F (36.7 C) (Oral)  Ht 5\' 10"  (1.778 m)  Wt 251 lb 9.6 oz (114.125 kg)  BMI 36.10 kg/m2  SpO2 97%  BP Readings from Last 3 Encounters:  02/24/15 132/73  02/16/15 123/75  02/02/15 133/86    Wt Readings from Last 3 Encounters:  02/24/15 251 lb 9.6 oz (114.125 kg)  02/16/15 246 lb (111.585  kg)  02/02/15 247 lb (112.038 kg)     Physical Exam  Constitutional: He is oriented to person, place, and time. He appears  well-developed and well-nourished.  HENT:  Head: Normocephalic and atraumatic.  Mouth/Throat: Oropharynx is clear and moist.  Eyes: EOM are normal. Pupils are equal, round, and reactive to light.  Neck: Normal range of motion. No tracheal deviation present. No thyromegaly present.  Cardiovascular: Normal rate, regular rhythm and normal heart sounds.  Exam reveals no gallop and no friction rub.   No murmur heard. Pulmonary/Chest: Breath sounds normal. He has no wheezes. He has no rales.  Abdominal: Soft. He exhibits no mass. There is no tenderness.  Musculoskeletal: Normal range of motion. He exhibits no edema.  Neurological: He is alert and oriented to person, place, and time.  Skin: Skin is warm and dry.  Psychiatric: He has a normal mood and affect.     Lab Results  Component Value Date   GLUCOSE 161* 12/01/2014   CHOL 100 12/01/2014   TRIG 105 12/01/2014   HDL 29* 12/01/2014   LDLDIRECT 70 09/24/2014   LDLCALC 50 12/01/2014   ALT 21 12/01/2014   AST 13 12/01/2014   NA 142 12/01/2014   K 4.0 12/01/2014   CL 102 12/01/2014   CREATININE 0.81 12/01/2014   BUN 15 12/01/2014   CO2 24 12/01/2014   PSA 0.70 06/15/2012   HGBA1C 7.0 12/01/2014    No results found.  Assessment & Plan:   There are no diagnoses linked to this encounter. I am having Mr. Clayton [name] maintain his glucose blood, ONETOUCH DELICA LANCETS 99991111, aspirin, atorvastatin, losartan-hydrochlorothiazide, metFORMIN, and escitalopram.  No orders of the defined types were placed in this encounter.     Follow-up: No Follow-up on file.  Claretta Fraise, M.D.

## 2015-02-24 NOTE — Progress Notes (Signed)
Quick Note:  2 dimin ta's - repeat colon 2021-2 ______

## 2015-02-25 ENCOUNTER — Encounter: Payer: BLUE CROSS/BLUE SHIELD | Admitting: Family Medicine

## 2015-03-09 ENCOUNTER — Other Ambulatory Visit: Payer: Self-pay | Admitting: Family Medicine

## 2015-03-10 NOTE — Telephone Encounter (Signed)
Last seen 02/24/15  Dr Livia Snellen

## 2015-05-04 ENCOUNTER — Ambulatory Visit: Payer: BLUE CROSS/BLUE SHIELD | Admitting: Nurse Practitioner

## 2015-05-05 ENCOUNTER — Encounter: Payer: Self-pay | Admitting: Nurse Practitioner

## 2015-05-11 ENCOUNTER — Encounter: Payer: Self-pay | Admitting: Nurse Practitioner

## 2015-05-11 ENCOUNTER — Ambulatory Visit (INDEPENDENT_AMBULATORY_CARE_PROVIDER_SITE_OTHER): Payer: BLUE CROSS/BLUE SHIELD | Admitting: Nurse Practitioner

## 2015-05-11 VITALS — BP 124/79 | HR 73 | Temp 97.8°F | Ht 70.0 in | Wt 253.0 lb

## 2015-05-11 DIAGNOSIS — E119 Type 2 diabetes mellitus without complications: Secondary | ICD-10-CM | POA: Diagnosis not present

## 2015-05-11 DIAGNOSIS — Z1159 Encounter for screening for other viral diseases: Secondary | ICD-10-CM | POA: Diagnosis not present

## 2015-05-11 DIAGNOSIS — Z1212 Encounter for screening for malignant neoplasm of rectum: Secondary | ICD-10-CM

## 2015-05-11 DIAGNOSIS — E669 Obesity, unspecified: Secondary | ICD-10-CM

## 2015-05-11 DIAGNOSIS — F329 Major depressive disorder, single episode, unspecified: Secondary | ICD-10-CM

## 2015-05-11 DIAGNOSIS — I10 Essential (primary) hypertension: Secondary | ICD-10-CM | POA: Diagnosis not present

## 2015-05-11 DIAGNOSIS — K219 Gastro-esophageal reflux disease without esophagitis: Secondary | ICD-10-CM

## 2015-05-11 DIAGNOSIS — E785 Hyperlipidemia, unspecified: Secondary | ICD-10-CM | POA: Diagnosis not present

## 2015-05-11 DIAGNOSIS — F32A Depression, unspecified: Secondary | ICD-10-CM

## 2015-05-11 LAB — POCT GLYCOSYLATED HEMOGLOBIN (HGB A1C): Hemoglobin A1C: 7.4

## 2015-05-11 MED ORDER — ATORVASTATIN CALCIUM 40 MG PO TABS: ORAL_TABLET | ORAL | Status: DC

## 2015-05-11 MED ORDER — LOSARTAN POTASSIUM-HCTZ 100-25 MG PO TABS: ORAL_TABLET | ORAL | Status: DC

## 2015-05-11 MED ORDER — METFORMIN HCL 500 MG PO TABS: 500.0000 mg | ORAL_TABLET | Freq: Two times a day (BID) | ORAL | Status: DC

## 2015-05-11 NOTE — Progress Notes (Signed)
Subjective:    Patient ID: Clayton [name], male    DOB: 07-31-1960, 55 y.o.   MRN: [id number]    Patient here today for follow up of chronic medical problems.  Outpatient Encounter Prescriptions as of 05/11/2015  Medication Sig  . aspirin 81 MG EC tablet Take 1 tablet (81 mg total) by mouth daily. Swallow whole.  . escitalopram (LEXAPRO) 10 MG tablet TAKE ONE (1) TABLET EACH DAY  . glucose blood test strip Use to check BG 1 to 2 times daily.  One Touch Verio IQ test strips.  DX:  E11.9  . losartan-hydrochlorothiazide (HYZAAR) 100-25 MG per tablet TAKE ONE (1) TABLET EACH DAY  . metFORMIN (GLUCOPHAGE) 500 MG tablet Take 1 tablet (500 mg total) by mouth 2 (two) times daily with a meal.  . ONETOUCH DELICA LANCETS 84X MISC Use to check BG once or twice daily.  Dx:  E11.9  . atorvastatin (LIPITOR) 40 MG tablet TAKE ONE (1) TABLET EACH DAY (Patient not taking: Reported on 05/11/2015)   No facility-administered encounter medications on file as of 05/11/2015.     Hypertension This is a chronic problem. The current episode started more than 1 year ago. The problem has been waxing and waning since onset. Pertinent negatives include no chest pain, headaches, neck pain, palpitations or shortness of breath. Risk factors for coronary artery disease include dyslipidemia, family history and obesity. Past treatments include angiotensin blockers and diuretics. The current treatment provides moderate improvement. Compliance problems include diet and exercise.   Hyperlipidemia This is a chronic problem. The current episode started more than 1 year ago. The problem is controlled. Recent lipid tests were reviewed and are normal. He has no history of diabetes, hypothyroidism or obesity. Pertinent negatives include no chest pain or shortness of breath. Current antihyperlipidemic treatment includes statins and ezetimibe. The current treatment provides moderate improvement of lipids. Compliance problems include  adherence to diet and adherence to exercise.  Risk factors for coronary artery disease include dyslipidemia, hypertension and male sex.  Diabetes He presents for his follow-up diabetic visit. He has type 2 diabetes mellitus. No MedicAlert identification noted. Pertinent negatives for hypoglycemia include no headaches. There are no diabetic associated symptoms. Pertinent negatives for diabetes include no chest pain. There are no hypoglycemic complications. Symptoms are stable. There are no diabetic complications. Risk factors for coronary artery disease include diabetes mellitus, dyslipidemia, hypertension, male sex, obesity and sedentary lifestyle. Current diabetic treatment includes oral agent (monotherapy). He is compliant with treatment all of the time. His weight is stable. He is following a generally unhealthy diet. When asked about meal planning, he reported none. He has not had a previous visit with a dietitian. (Does not check blood sugars at home) An ACE inhibitor/angiotensin II receptor blocker is being taken. He does not see a podiatrist.Eye exam is current.  Depression Lexapro working well to keep him calm and from worrying so much- No side effects, no complaints GERD Currently on protonix- keeps symptoms under control, Reports he has not taken this in awhile, but no noted symptoms  Review of Systems  Constitutional: Negative.   HENT: Negative.   Respiratory: Negative for shortness of breath.   Cardiovascular: Negative for chest pain and palpitations.  Musculoskeletal: Negative for neck pain.  Neurological: Negative for headaches.  Psychiatric/Behavioral: Negative.   All other systems reviewed and are negative.      Objective:   Physical Exam  Constitutional: He is oriented to person, place, and time. He appears well-developed and  well-nourished.  HENT:  Head: Normocephalic.  Right Ear: External ear normal.  Left Ear: External ear normal.  Nose: Nose normal.  Mouth/Throat:  Oropharynx is clear and moist.  Eyes: EOM are normal. Pupils are equal, round, and reactive to light.  Neck: Normal range of motion. Neck supple. No thyromegaly present.  Cardiovascular: Normal rate, regular rhythm, normal heart sounds and intact distal pulses.   No murmur heard. Pulmonary/Chest: Effort normal and breath sounds normal. He has no wheezes. He has no rales.  Abdominal: Soft. Bowel sounds are normal.  Musculoskeletal: Normal range of motion.  Neurological: He is alert and oriented to person, place, and time.  Skin: Skin is warm and dry.  Psychiatric: He has a normal mood and affect. His behavior is normal. Judgment and thought content normal.   BP 124/79 mmHg  Pulse 73  Temp(Src) 97.8 F (36.6 C) (Oral)  Ht _0  (1.778 m)  Wt 253 lb (114.76 kg)  BMI 36.30 kg/m2   Results for orders placed or performed in visit on 05/11/15  POCT glycosylated hemoglobin (Hb A1C)  Result Value Ref Range   Hemoglobin A1C 7.4        Assessment & Plan:  1. Type 2 diabetes mellitus without complication, without long-term current use of insulin (HCC) Continue to watch carbs in diet - POCT glycosylated hemoglobin (Hb A1C) - Microalbumin / creatinine urine ratio - metFORMIN (GLUCOPHAGE) 500 MG tablet; Take 1 tablet (500 mg total) by mouth 2 (two) times daily with a meal.  Dispense: 60 tablet; Refill: 5  2. Hyperlipidemia with target LDL less than 100 Low fta diet - Lipid panel - atorvastatin (LIPITOR) 40 MG tablet; TAKE ONE (1) TABLET EACH DAY  Dispense: 30 tablet; Refill: 5  3. Essential hypertension Do not  Add salt o diet - CMP14+EGFR - losartan-hydrochlorothiazide (HYZAAR) 100-25 MG tablet; TAKE ONE (1) TABLET EACH DAY  Dispense: 30 tablet; Refill: 5  4. Depression Stress management  6. Obesity Discussed diet and exercise for person with BMI >25 Will recheck weight in 3-6 months  7. Gastroesophageal reflux disease without esophagitis Avoid spicy foods Do not eat 2 hours  prior to bedtime  8. Screening for malignant neoplasm of the rectum - Fecal occult blood, imunochemical; Future  9. Need for hepatitis C screening test - Hepatitis C antibody    Labs pending Health maintenance reviewed Diet and exercise encouraged Continue all meds Follow up  In 3 months    [place], FNP

## 2015-05-11 NOTE — Patient Instructions (Signed)

## 2015-05-12 LAB — MICROALBUMIN / CREATININE URINE RATIO
Creatinine, Urine: 96.3 mg/dL
MICROALB/CREAT RATIO: 5.7 mg/g creat (ref 0.0–30.0)
Microalbumin, Urine: 5.5 ug/mL

## 2015-05-13 LAB — LIPID PANEL
Chol/HDL Ratio: 5.6 ratio units — ABNORMAL HIGH (ref 0.0–5.0)
Cholesterol, Total: 174 mg/dL (ref 100–199)
HDL: 31 mg/dL — ABNORMAL LOW (ref >39)
LDL Calculated: 100 mg/dL — ABNORMAL HIGH (ref 0–99)
Triglycerides: 214 mg/dL — ABNORMAL HIGH (ref 0–149)
VLDL Cholesterol Cal: 43 mg/dL — ABNORMAL HIGH (ref 5–40)

## 2015-05-13 LAB — CMP14+EGFR
ALT: 22 IU/L (ref 0–44)
AST: 13 IU/L (ref 0–40)
Albumin/Globulin Ratio: 1.8 (ref 1.1–2.5)
Albumin: 4.2 g/dL (ref 3.5–5.5)
Alkaline Phosphatase: 71 IU/L (ref 39–117)
BUN/Creatinine Ratio: 20 (ref 9–20)
BUN: 15 mg/dL (ref 6–24)
Bilirubin Total: 0.6 mg/dL (ref 0.0–1.2)
CO2: 23 mmol/L (ref 18–29)
Calcium: 9.3 mg/dL (ref 8.7–10.2)
Chloride: 97 mmol/L (ref 96–106)
Creatinine, Ser: 0.76 mg/dL (ref 0.76–1.27)
GFR calc Af Amer: 120 mL/min/{1.73_m2} (ref >59)
GFR calc non Af Amer: 104 mL/min/{1.73_m2} (ref >59)
Globulin, Total: 2.4 g/dL (ref 1.5–4.5)
Glucose: 122 mg/dL — ABNORMAL HIGH (ref 65–99)
Potassium: 3.9 mmol/L (ref 3.5–5.2)
Sodium: 138 mmol/L (ref 134–144)
Total Protein: 6.6 g/dL (ref 6.0–8.5)

## 2015-05-13 LAB — HEPATITIS C ANTIBODY: Hep C Virus Ab: <0.1 s/co ratio (ref 0.0–0.9)

## 2015-06-05 ENCOUNTER — Other Ambulatory Visit: Payer: Self-pay | Admitting: Family Medicine

## 2015-08-19 ENCOUNTER — Other Ambulatory Visit: Payer: Self-pay | Admitting: Nurse Practitioner

## 2015-12-21 ENCOUNTER — Other Ambulatory Visit: Payer: Self-pay | Admitting: Nurse Practitioner

## 2016-02-01 LAB — HM DIABETES EYE EXAM

## 2016-02-06 ENCOUNTER — Other Ambulatory Visit: Payer: Self-pay | Admitting: Nurse Practitioner

## 2016-02-08 NOTE — Telephone Encounter (Signed)
Last refill without being seen 

## 2016-02-09 NOTE — Telephone Encounter (Signed)
Patient aware that prescriptions have been sent in.  Appointment for followup chronic medical problems made on 03/07/16 at 12 pm

## 2016-02-22 ENCOUNTER — Encounter: Payer: Self-pay | Admitting: Family Medicine

## 2016-02-22 ENCOUNTER — Ambulatory Visit (INDEPENDENT_AMBULATORY_CARE_PROVIDER_SITE_OTHER): Payer: BLUE CROSS/BLUE SHIELD | Admitting: Family Medicine

## 2016-02-22 VITALS — BP 140/80 | HR 83 | Temp 98.0°F | Ht 70.0 in | Wt 251.0 lb

## 2016-02-22 DIAGNOSIS — E119 Type 2 diabetes mellitus without complications: Secondary | ICD-10-CM

## 2016-02-22 DIAGNOSIS — Z024 Encounter for examination for driving license: Secondary | ICD-10-CM

## 2016-02-22 LAB — BAYER DCA HB A1C WAIVED: HB A1C (BAYER DCA - WAIVED): 7.4 % — ABNORMAL HIGH (ref <7.0)

## 2016-02-22 NOTE — Progress Notes (Signed)
Subjective:  Patient ID: Clayton [name], male    DOB: 1961-01-27  Age: 55 y.o. MRN: [id number]  CC: Annual Exam (Prvt DOT PE)   HPI Clayton [name] presents for DOT eval. BiPAP reviewed showing excellent compliance. Overdue for A1c.   History Clayton [name] has a past medical history of Allergy; Anxiety; Depression; Diabetes mellitus without complication ([place]); GERD (gastroesophageal reflux disease); High cholesterol; adenomatous colonic polyps (02/16/2015); Hypertension; Sleep apnea; and Testosterone deficiency.   He has a past surgical history that includes Anterior cruciate ligament repair.   His family history includes Cancer in his mother; Diabetes in his father and paternal grandfather; Hypertension in his father.He reports that he has been smoking Cigarettes.  He has a 17.50 pack-year smoking history. His smokeless tobacco use includes Snuff. He reports that he drinks alcohol. He reports that he does not use drugs.    ROS Review of Systems  Constitutional: Negative for chills, diaphoresis, fever and unexpected weight change.  HENT: Negative for congestion, hearing loss, rhinorrhea and sore throat.   Eyes: Negative for visual disturbance.  Respiratory: Negative for cough and shortness of breath.   Cardiovascular: Negative for chest pain.  Gastrointestinal: Negative for abdominal pain, constipation and diarrhea.  Genitourinary: Negative for dysuria and flank pain.  Musculoskeletal: Negative for arthralgias and joint swelling.  Skin: Negative for rash.  Neurological: Negative for dizziness and headaches.  Psychiatric/Behavioral: Negative for dysphoric mood and sleep disturbance.    Objective:  BP 140/80   Pulse 83   Temp 98 F (36.7 C) (Oral)   Ht 5\' 10"  (1.778 m)   Wt 251 lb (113.9 kg)   BMI 36.01 kg/m   BP Readings from Last 3 Encounters:  02/22/16 140/80  05/11/15 124/79  02/24/15 132/73    Wt Readings from Last 3 Encounters:  02/22/16 251 lb (113.9 kg)    05/11/15 253 lb (114.8 kg)  02/24/15 251 lb 9.6 oz (114.1 kg)     Physical Exam  Constitutional: He is oriented to person, place, and time. He appears well-developed and well-nourished. No distress.  HENT:  Head: Normocephalic and atraumatic.  Right Ear: External ear normal.  Left Ear: External ear normal.  Nose: Nose normal.  Mouth/Throat: Oropharynx is clear and moist.  Eyes: Conjunctivae and EOM are normal. Pupils are equal, round, and reactive to light.  Neck: Normal range of motion. Neck supple. No thyromegaly present.  Cardiovascular: Normal rate, regular rhythm and normal heart sounds.   No murmur heard. Pulmonary/Chest: Effort normal and breath sounds normal. No respiratory distress. He has no wheezes. He has no rales.  Abdominal: Soft. Bowel sounds are normal. He exhibits no distension. There is no tenderness.  Lymphadenopathy:    He has no cervical adenopathy.  Neurological: He is alert and oriented to person, place, and time. He has normal reflexes.  Skin: Skin is warm and dry.  Psychiatric: He has a normal mood and affect. His behavior is normal. Judgment and thought content normal.     Lab Results  Component Value Date   GLUCOSE 122 (H) 05/11/2015   CHOL 174 05/11/2015   TRIG 214 (H) 05/11/2015   HDL 31 (L) 05/11/2015   LDLDIRECT 70 09/24/2014   LDLCALC 100 (H) 05/11/2015   ALT 22 05/11/2015   AST 13 05/11/2015   NA 138 05/11/2015   K 3.9 05/11/2015   CL 97 05/11/2015   CREATININE 0.76 05/11/2015   BUN 15 05/11/2015   CO2 23 05/11/2015   PSA 0.70 06/15/2012  HGBA1C 7.4 05/11/2015   MICROALBUR 50 05/09/2014    No results found.  Assessment & Plan:   Clayton [name] was seen today for annual exam.  Diagnoses and all orders for this visit:  Type 2 diabetes mellitus without complication, without long-term current use of insulin ([place]) -     Bayer DCA Hb A1c Waived    HbA1c I have discontinued [name] atorvastatin. I am also having him maintain his  glucose blood, ONETOUCH DELICA LANCETS 99991111, aspirin, escitalopram, losartan-hydrochlorothiazide, metFORMIN, escitalopram, escitalopram, and pantoprazole.  No orders of the defined types were placed in this encounter.    Follow-up: Return in about 2 weeks (around 03/07/2016) for diabetes with MMM.  Claretta Fraise, M.D.

## 2016-03-04 ENCOUNTER — Encounter: Payer: Self-pay | Admitting: Family Medicine

## 2016-03-07 ENCOUNTER — Ambulatory Visit (INDEPENDENT_AMBULATORY_CARE_PROVIDER_SITE_OTHER): Payer: BLUE CROSS/BLUE SHIELD | Admitting: Nurse Practitioner

## 2016-03-07 ENCOUNTER — Encounter: Payer: Self-pay | Admitting: Nurse Practitioner

## 2016-03-07 VITALS — BP 121/76 | HR 70 | Temp 97.1°F | Ht 70.0 in | Wt 255.0 lb

## 2016-03-07 DIAGNOSIS — G4733 Obstructive sleep apnea (adult) (pediatric): Secondary | ICD-10-CM | POA: Diagnosis not present

## 2016-03-07 DIAGNOSIS — E785 Hyperlipidemia, unspecified: Secondary | ICD-10-CM

## 2016-03-07 DIAGNOSIS — F3342 Major depressive disorder, recurrent, in full remission: Secondary | ICD-10-CM | POA: Diagnosis not present

## 2016-03-07 DIAGNOSIS — K219 Gastro-esophageal reflux disease without esophagitis: Secondary | ICD-10-CM | POA: Diagnosis not present

## 2016-03-07 DIAGNOSIS — F3341 Major depressive disorder, recurrent, in partial remission: Secondary | ICD-10-CM | POA: Diagnosis not present

## 2016-03-07 DIAGNOSIS — E119 Type 2 diabetes mellitus without complications: Secondary | ICD-10-CM

## 2016-03-07 DIAGNOSIS — I10 Essential (primary) hypertension: Secondary | ICD-10-CM

## 2016-03-07 MED ORDER — LOSARTAN POTASSIUM-HCTZ 100-25 MG PO TABS: ORAL_TABLET | ORAL | 5 refills | Status: DC

## 2016-03-07 MED ORDER — METFORMIN HCL 500 MG PO TABS: 500.0000 mg | ORAL_TABLET | Freq: Two times a day (BID) | ORAL | 5 refills | Status: DC

## 2016-03-07 MED ORDER — ESCITALOPRAM OXALATE 10 MG PO TABS: ORAL_TABLET | ORAL | 0 refills | Status: DC

## 2016-03-07 MED ORDER — SILDENAFIL CITRATE 20 MG PO TABS: ORAL_TABLET | ORAL | 3 refills | Status: DC

## 2016-03-07 MED ORDER — PANTOPRAZOLE SODIUM 40 MG PO TBEC: DELAYED_RELEASE_TABLET | ORAL | 5 refills | Status: DC

## 2016-03-07 NOTE — Patient Instructions (Signed)
Carbohydrate Counting for Diabetes Mellitus, Adult Carbohydrate counting is a method for keeping track of how many carbohydrates you eat. Eating carbohydrates naturally increases the amount of sugar (glucose) in the blood. Counting how many carbohydrates you eat helps keep your blood glucose within normal limits, which helps you manage your diabetes (diabetes mellitus). It is important to know how many carbohydrates you can safely have in each meal. This is different for every person. A diet and nutrition specialist (registered dietitian) can help you make a meal plan and calculate how many carbohydrates you should have at each meal and snack. Carbohydrates are found in the following foods:  Grains, such as breads and cereals.  Dried beans and soy products.  Starchy vegetables, such as potatoes, peas, and corn.  Fruit and fruit juices.  Milk and yogurt.  Sweets and snack foods, such as cake, cookies, candy, chips, and soft drinks. How do I count carbohydrates? There are two ways to count carbohydrates in food. You can use either of the methods or a combination of both. Reading "Nutrition Facts" on packaged food  The "Nutrition Facts" list is included on the labels of almost all packaged foods and beverages in the [place] It includes:  The serving size.  Information about nutrients in each serving, including the grams (g) of carbohydrate per serving. To use the "Nutrition Facts":  Decide how many servings you will have.  Multiply the number of servings by the number of carbohydrates per serving.  The resulting number is the total amount of carbohydrates that you will be having. Learning standard serving sizes of other foods  When you eat foods containing carbohydrates that are not packaged or do not include "Nutrition Facts" on the label, you need to measure the servings in order to count the amount of carbohydrates:  Measure the foods that you will eat with a food scale or measuring  cup, if needed.  Decide how many standard-size servings you will eat.  Multiply the number of servings by 15. Most carbohydrate-rich foods have about 15 g of carbohydrates per serving.  For example, if you eat 8 oz (170 g) of strawberries, you will have eaten 2 servings and 30 g of carbohydrates (2 servings x 15 g = 30 g).  For foods that have more than one food mixed, such as soups and casseroles, you must count the carbohydrates in each food that is included. The following list contains standard serving sizes of common carbohydrate-rich foods. Each of these servings has about 15 g of carbohydrates:   hamburger bun or  English muffin.   oz (15 mL) syrup.   oz (14 g) jelly.  1 slice of bread.  1 six-inch tortilla.  3 oz (85 g) cooked rice or pasta.  4 oz (113 g) cooked dried beans.  4 oz (113 g) starchy vegetable, such as peas, corn, or potatoes.  4 oz (113 g) hot cereal.  4 oz (113 g) mashed potatoes or  of a large baked potato.  4 oz (113 g) canned or frozen fruit.  4 oz (120 mL) fruit juice.  4-6 crackers.  6 chicken nuggets.  6 oz (170 g) unsweetened dry cereal.  6 oz (170 g) plain fat-free yogurt or yogurt sweetened with artificial sweeteners.  8 oz (240 mL) milk.  8 oz (170 g) fresh fruit or one small piece of fruit.  24 oz (680 g) popped popcorn. Example of carbohydrate counting Sample meal  3 oz (85 g) chicken breast.  6 oz (  170 g) brown rice.  4 oz (113 g) corn.  8 oz (240 mL) milk.  8 oz (170 g) strawberries with sugar-free whipped topping. Carbohydrate calculation 1. Identify the foods that contain carbohydrates:  Rice.  Corn.  Milk.  Strawberries. 2. Calculate how many servings you have of each food:  2 servings rice.  1 serving corn.  1 serving milk.  1 serving strawberries. 3. Multiply each number of servings by 15 g:  2 servings rice x 15 g = 30 g.  1 serving corn x 15 g = 15 g.  1 serving milk x 15 g = 15  g.  1 serving strawberries x 15 g = 15 g. 4. Add together all of the amounts to find the total grams of carbohydrates eaten:  30 g + 15 g + 15 g + 15 g = 75 g of carbohydrates total. This information is not intended to replace advice given to you by your health care provider. Make sure you discuss any questions you have with your health care provider. Document Released: 03/07/2005 Document Revised: 09/25/2015 Document Reviewed: 08/19/2015 [employer]vier Interactive Patient Education  2017 [employer]  

## 2016-03-07 NOTE — Progress Notes (Signed)
Subjective:    Patient ID: Clayton [name], male    DOB: 07/12/1960, 55 y.o.   MRN: [id number]    Patient here today for follow up of chronic medical problems. No change ssince last visit. No complaints today.  Outpatient Encounter Prescriptions as of 03/07/2016  Medication Sig  . aspirin 81 MG EC tablet Take 1 tablet (81 mg total) by mouth daily. Swallow whole.  . escitalopram (LEXAPRO) 10 MG tablet TAKE ONE (1) TABLET EACH DAY  . glucose blood test strip Use to check BG 1 to 2 times daily.  One Touch Verio IQ test strips.  DX:  E11.9  . losartan-hydrochlorothiazide (HYZAAR) 100-25 MG tablet TAKE ONE (1) TABLET EACH DAY  . metFORMIN (GLUCOPHAGE) 500 MG tablet Take 1 tablet (500 mg total) by mouth 2 (two) times daily with a meal.  . ONETOUCH DELICA LANCETS 43X MISC Use to check BG once or twice daily.  Dx:  E11.9  . pantoprazole (PROTONIX) 40 MG tablet TAKE ONE (1) TABLET EACH DAY    Hypertension  This is a chronic problem. The current episode started more than 1 year ago. The problem has been waxing and waning since onset. Pertinent negatives include no chest pain, headaches, neck pain, palpitations or shortness of breath. Risk factors for coronary artery disease include dyslipidemia, family history and obesity. Past treatments include angiotensin blockers and diuretics. The current treatment provides moderate improvement. Compliance problems include diet and exercise.   Hyperlipidemia  This is a chronic problem. The current episode started more than 1 year ago. The problem is controlled. Recent lipid tests were reviewed and are normal. He has no history of diabetes, hypothyroidism or obesity. Pertinent negatives include no chest pain or shortness of breath. Current antihyperlipidemic treatment includes statins and ezetimibe. The current treatment provides moderate improvement of lipids. Compliance problems include adherence to diet and adherence to exercise.  Risk factors for coronary artery  disease include dyslipidemia, hypertension and male sex.  Diabetes  He presents for his follow-up diabetic visit. He has type 2 diabetes mellitus. No MedicAlert identification noted. Pertinent negatives for hypoglycemia include no headaches. There are no diabetic associated symptoms. Pertinent negatives for diabetes include no chest pain. There are no hypoglycemic complications. Symptoms are stable. There are no diabetic complications. Risk factors for coronary artery disease include diabetes mellitus, dyslipidemia, hypertension, male sex, obesity and sedentary lifestyle. Current diabetic treatment includes oral agent (monotherapy). He is compliant with treatment all of the time. His weight is stable. He is following a generally unhealthy diet. When asked about meal planning, he reported none. He has not had a previous visit with a dietitian. (Does not check blood sugars at home) An ACE inhibitor/angiotensin II receptor blocker is being taken. He does not see a podiatrist.Eye exam is current.  Depression Lexapro working well to keep him calm and from worrying so much- No side effects, no complaints GERD Currently on protonix- keeps symptoms under control, Reports he has not taken this in awhile, but no noted symptoms  Review of Systems  Constitutional: Negative.   HENT: Negative.   Respiratory: Negative for shortness of breath.   Cardiovascular: Negative for chest pain and palpitations.  Musculoskeletal: Negative for neck pain.  Neurological: Negative for headaches.  Psychiatric/Behavioral: Negative.   All other systems reviewed and are negative.      Objective:   Physical Exam  Constitutional: He is oriented to person, place, and time. He appears well-developed and well-nourished.  HENT:  Head: Normocephalic.  Right  Ear: External ear normal.  Left Ear: External ear normal.  Nose: Nose normal.  Mouth/Throat: Oropharynx is clear and moist.  Eyes: EOM are normal. Pupils are equal, round,  and reactive to light.  Neck: Normal range of motion. Neck supple. No thyromegaly present.  Cardiovascular: Normal rate, regular rhythm, normal heart sounds and intact distal pulses.   No murmur heard. Pulmonary/Chest: Effort normal and breath sounds normal. He has no wheezes. He has no rales.  Abdominal: Soft. Bowel sounds are normal.  Musculoskeletal: Normal range of motion.  Neurological: He is alert and oriented to person, place, and time.  Skin: Skin is warm and dry.  Psychiatric: He has a normal mood and affect. His behavior is normal. Judgment and thought content normal.   BP 121/76   Pulse 70   Temp 97.1 F (36.2 C) (Oral)   Ht _0  (1.778 m)   Wt 255 lb (115.7 kg)   BMI 36.59 kg/m    Results for orders placed or performed in visit on 02/22/16  Bayer DCA Hb A1c Waived  Result Value Ref Range   Bayer DCA Hb A1c Waived 7.4 (H) <7.0 %       Assessment & Plan:   1. Essential hypertension, benign Low sodium diet - losartan-hydrochlorothiazide (HYZAAR) 100-25 MG tablet; TAKE ONE (1) TABLET EACH DAY  Dispense: 30 tablet; Refill: 5 - CMP14+EGFR  2. OSA (obstructive sleep apnea) CPAP machine  3. Gastroesophageal reflux disease without esophagitis Avoid spicy foods Do not eat 2 hours prior to bedtime - pantoprazole (PROTONIX) 40 MG tablet; TAKE ONE (1) TABLET EACH DAY  Dispense: 30 tablet; Refill: 5  4. Type 2 diabetes mellitus without complication, without long-term current use of insulin (HCC) Stricter carb counting - metFORMIN (GLUCOPHAGE) 500 MG tablet; Take 1 tablet (500 mg total) by mouth 2 (two) times daily with a meal.  Dispense: 60 tablet; Refill: 5  5. Recurrent major depressive disorder, in full remission [facility]) Stress management  6. Hyperlipidemia with target LDL less than 100 Low fat diet - Lipid panel  7. Recurrent major depressive disorder, in partial remission (HCC) - escitalopram (LEXAPRO) 10 MG tablet; TAKE ONE (1) TABLET EACH DAY  Dispense: 30  tablet; Refill: 0    Labs pending Health maintenance reviewed Diet and exercise encouraged Continue all meds Follow up  In 6 month   [place], FNP

## 2016-03-08 LAB — LIPID PANEL
Chol/HDL Ratio: 6.2 ratio units — ABNORMAL HIGH (ref 0.0–5.0)
Cholesterol, Total: 193 mg/dL (ref 100–199)
HDL: 31 mg/dL — ABNORMAL LOW (ref >39)
LDL Calculated: 86 mg/dL (ref 0–99)
Triglycerides: 382 mg/dL — ABNORMAL HIGH (ref 0–149)
VLDL Cholesterol Cal: 76 mg/dL — ABNORMAL HIGH (ref 5–40)

## 2016-03-08 LAB — CMP14+EGFR
ALT: 31 IU/L (ref 0–44)
AST: 18 IU/L (ref 0–40)
Albumin/Globulin Ratio: 1.5 (ref 1.2–2.2)
Albumin: 4.3 g/dL (ref 3.5–5.5)
Alkaline Phosphatase: 88 IU/L (ref 39–117)
BUN/Creatinine Ratio: 21 — ABNORMAL HIGH (ref 9–20)
BUN: 14 mg/dL (ref 6–24)
Bilirubin Total: 0.9 mg/dL (ref 0.0–1.2)
CO2: 28 mmol/L (ref 18–29)
Calcium: 9 mg/dL (ref 8.7–10.2)
Chloride: 96 mmol/L (ref 96–106)
Creatinine, Ser: 0.67 mg/dL — ABNORMAL LOW (ref 0.76–1.27)
GFR calc Af Amer: 127 mL/min/{1.73_m2} (ref >59)
GFR calc non Af Amer: 110 mL/min/{1.73_m2} (ref >59)
Globulin, Total: 2.9 g/dL (ref 1.5–4.5)
Glucose: 154 mg/dL — ABNORMAL HIGH (ref 65–99)
Potassium: 3.8 mmol/L (ref 3.5–5.2)
Sodium: 137 mmol/L (ref 134–144)
Total Protein: 7.2 g/dL (ref 6.0–8.5)

## 2016-04-29 ENCOUNTER — Telehealth: Payer: Self-pay | Admitting: Nurse Practitioner

## 2016-04-29 NOTE — Telephone Encounter (Signed)
Patient aware to use warm compresses on eye and if no better by Monday 02/12 he will need to come in and be seen per Dr. Evette Doffing

## 2016-05-02 ENCOUNTER — Other Ambulatory Visit: Payer: Self-pay | Admitting: Nurse Practitioner

## 2016-06-06 ENCOUNTER — Ambulatory Visit (INDEPENDENT_AMBULATORY_CARE_PROVIDER_SITE_OTHER): Payer: BLUE CROSS/BLUE SHIELD | Admitting: Nurse Practitioner

## 2016-06-06 ENCOUNTER — Encounter: Payer: Self-pay | Admitting: Nurse Practitioner

## 2016-06-06 VITALS — BP 134/85 | HR 74 | Temp 97.3°F | Ht 70.0 in | Wt 248.0 lb

## 2016-06-06 DIAGNOSIS — E785 Hyperlipidemia, unspecified: Secondary | ICD-10-CM | POA: Diagnosis not present

## 2016-06-06 DIAGNOSIS — I10 Essential (primary) hypertension: Secondary | ICD-10-CM | POA: Diagnosis not present

## 2016-06-06 DIAGNOSIS — E119 Type 2 diabetes mellitus without complications: Secondary | ICD-10-CM

## 2016-06-06 DIAGNOSIS — K219 Gastro-esophageal reflux disease without esophagitis: Secondary | ICD-10-CM | POA: Diagnosis not present

## 2016-06-06 DIAGNOSIS — G4733 Obstructive sleep apnea (adult) (pediatric): Secondary | ICD-10-CM | POA: Diagnosis not present

## 2016-06-06 DIAGNOSIS — F3342 Major depressive disorder, recurrent, in full remission: Secondary | ICD-10-CM

## 2016-06-06 LAB — BAYER DCA HB A1C WAIVED: HB A1C (BAYER DCA - WAIVED): 8.3 % — ABNORMAL HIGH (ref <7.0)

## 2016-06-06 MED ORDER — METFORMIN HCL 1000 MG PO TABS: 1000.0000 mg | ORAL_TABLET | Freq: Two times a day (BID) | ORAL | 1 refills | Status: DC

## 2016-06-06 MED ORDER — ESCITALOPRAM OXALATE 10 MG PO TABS: ORAL_TABLET | ORAL | 1 refills | Status: DC

## 2016-06-06 MED ORDER — PANTOPRAZOLE SODIUM 40 MG PO TBEC: DELAYED_RELEASE_TABLET | ORAL | 5 refills | Status: DC

## 2016-06-06 MED ORDER — LOSARTAN POTASSIUM-HCTZ 100-25 MG PO TABS: ORAL_TABLET | ORAL | 5 refills | Status: DC

## 2016-06-06 NOTE — Patient Instructions (Signed)
Carbohydrate Counting for Diabetes Mellitus, Adult Carbohydrate counting is a method for keeping track of how many carbohydrates you eat. Eating carbohydrates naturally increases the amount of sugar (glucose) in the blood. Counting how many carbohydrates you eat helps keep your blood glucose within normal limits, which helps you manage your diabetes (diabetes mellitus). It is important to know how many carbohydrates you can safely have in each meal. This is different for every person. A diet and nutrition specialist (registered dietitian) can help you make a meal plan and calculate how many carbohydrates you should have at each meal and snack. Carbohydrates are found in the following foods:  Grains, such as breads and cereals.  Dried beans and soy products.  Starchy vegetables, such as potatoes, peas, and corn.  Fruit and fruit juices.  Milk and yogurt.  Sweets and snack foods, such as cake, cookies, candy, chips, and soft drinks. How do I count carbohydrates? There are two ways to count carbohydrates in food. You can use either of the methods or a combination of both. Reading "Nutrition Facts" on packaged food  The "Nutrition Facts" list is included on the labels of almost all packaged foods and beverages in the [place] It includes:  The serving size.  Information about nutrients in each serving, including the grams (g) of carbohydrate per serving. To use the "Nutrition Facts":  Decide how many servings you will have.  Multiply the number of servings by the number of carbohydrates per serving.  The resulting number is the total amount of carbohydrates that you will be having. Learning standard serving sizes of other foods  When you eat foods containing carbohydrates that are not packaged or do not include "Nutrition Facts" on the label, you need to measure the servings in order to count the amount of carbohydrates:  Measure the foods that you will eat with a food scale or measuring  cup, if needed.  Decide how many standard-size servings you will eat.  Multiply the number of servings by 15. Most carbohydrate-rich foods have about 15 g of carbohydrates per serving.  For example, if you eat 8 oz (170 g) of strawberries, you will have eaten 2 servings and 30 g of carbohydrates (2 servings x 15 g = 30 g).  For foods that have more than one food mixed, such as soups and casseroles, you must count the carbohydrates in each food that is included. The following list contains standard serving sizes of common carbohydrate-rich foods. Each of these servings has about 15 g of carbohydrates:   hamburger bun or  English muffin.   oz (15 mL) syrup.   oz (14 g) jelly.  1 slice of bread.  1 six-inch tortilla.  3 oz (85 g) cooked rice or pasta.  4 oz (113 g) cooked dried beans.  4 oz (113 g) starchy vegetable, such as peas, corn, or potatoes.  4 oz (113 g) hot cereal.  4 oz (113 g) mashed potatoes or  of a large baked potato.  4 oz (113 g) canned or frozen fruit.  4 oz (120 mL) fruit juice.  4-6 crackers.  6 chicken nuggets.  6 oz (170 g) unsweetened dry cereal.  6 oz (170 g) plain fat-free yogurt or yogurt sweetened with artificial sweeteners.  8 oz (240 mL) milk.  8 oz (170 g) fresh fruit or one small piece of fruit.  24 oz (680 g) popped popcorn. Example of carbohydrate counting Sample meal  3 oz (85 g) chicken breast.  6 oz (  170 g) brown rice.  4 oz (113 g) corn.  8 oz (240 mL) milk.  8 oz (170 g) strawberries with sugar-free whipped topping. Carbohydrate calculation 1. Identify the foods that contain carbohydrates:  Rice.  Corn.  Milk.  Strawberries. 2. Calculate how many servings you have of each food:  2 servings rice.  1 serving corn.  1 serving milk.  1 serving strawberries. 3. Multiply each number of servings by 15 g:  2 servings rice x 15 g = 30 g.  1 serving corn x 15 g = 15 g.  1 serving milk x 15 g = 15  g.  1 serving strawberries x 15 g = 15 g. 4. Add together all of the amounts to find the total grams of carbohydrates eaten:  30 g + 15 g + 15 g + 15 g = 75 g of carbohydrates total. This information is not intended to replace advice given to you by your health care provider. Make sure you discuss any questions you have with your health care provider. Document Released: 03/07/2005 Document Revised: 09/25/2015 Document Reviewed: 08/19/2015 [employer]vier Interactive Patient Education  2017 [employer]  

## 2016-06-06 NOTE — Progress Notes (Signed)
Subjective:    Patient ID: Clayton [name], male    DOB: [birth year], 56 y.o.   MRN: [id number]    Patient here today for follow up of chronic medical problems. No change ssince last visit. No complaints today.  Outpatient Encounter Prescriptions as of 03/07/2016  Medication Sig  . aspirin 81 MG EC tablet Take 1 tablet (81 mg total) by mouth daily. Swallow whole.  . escitalopram (LEXAPRO) 10 MG tablet TAKE ONE (1) TABLET EACH DAY  . glucose blood test strip Use to check BG 1 to 2 times daily.  One Touch Verio IQ test strips.  DX:  E11.9  . losartan-hydrochlorothiazide (HYZAAR) 100-25 MG tablet TAKE ONE (1) TABLET EACH DAY  . metFORMIN (GLUCOPHAGE) 500 MG tablet Take 1 tablet (500 mg total) by mouth 2 (two) times daily with a meal.  . ONETOUCH DELICA LANCETS 62I MISC Use to check BG once or twice daily.  Dx:  E11.9  . pantoprazole (PROTONIX) 40 MG tablet TAKE ONE (1) TABLET EACH DAY    Hypertension  This is a chronic problem. The current episode started more than 1 year ago. The problem has been waxing and waning since onset. Pertinent negatives include no chest pain, headaches, neck pain, palpitations or shortness of breath. Risk factors for coronary artery disease include dyslipidemia, family history and obesity. Past treatments include angiotensin blockers and diuretics. The current treatment provides moderate improvement. Compliance problems include diet and exercise.   Hyperlipidemia  This is a chronic problem. The current episode started more than 1 year ago. The problem is controlled. Recent lipid tests were reviewed and are normal. He has no history of diabetes, hypothyroidism or obesity. Pertinent negatives include no chest pain or shortness of breath. Current antihyperlipidemic treatment includes statins and ezetimibe. The current treatment provides moderate improvement of lipids. Compliance problems include adherence to diet and adherence to exercise.  Risk factors for coronary artery  disease include dyslipidemia, hypertension and male sex.  Diabetes  He presents for his follow-up diabetic visit. He has type 2 diabetes mellitus. No MedicAlert identification noted. Pertinent negatives for hypoglycemia include no headaches. There are no diabetic associated symptoms. Pertinent negatives for diabetes include no chest pain. There are no hypoglycemic complications. Symptoms are stable. There are no diabetic complications. Risk factors for coronary artery disease include diabetes mellitus, dyslipidemia, hypertension, male sex, obesity and sedentary lifestyle. Current diabetic treatment includes oral agent (monotherapy). He is compliant with treatment all of the time. His weight is stable. He is following a generally unhealthy diet. When asked about meal planning, he reported none. He has not had a previous visit with a dietitian. His breakfast blood glucose range is generally 180-200 mg/dl. His highest blood glucose is >200 mg/dl. (Does not check blood sugars at home very often) An ACE inhibitor/angiotensin II receptor blocker is being taken. He does not see a podiatrist.Eye exam is current.  Depression Lexapro working well to keep him calm and from worrying so much- No side effects, no complaints GERD Currently on protonix- keeps symptoms under control, Reports he has not taken this in awhile, but no noted symptoms  Review of Systems  Constitutional: Negative.   HENT: Negative.   Respiratory: Negative for shortness of breath.   Cardiovascular: Negative for chest pain and palpitations.  Musculoskeletal: Negative for neck pain.  Neurological: Negative for headaches.  Psychiatric/Behavioral: Negative.   All other systems reviewed and are negative.      Objective:   Physical Exam  Constitutional: He is  oriented to person, place, and time. He appears well-developed and well-nourished.  HENT:  Head: Normocephalic.  Right Ear: External ear normal.  Left Ear: External ear normal.   Nose: Nose normal.  Mouth/Throat: Oropharynx is clear and moist.  Eyes: EOM are normal. Pupils are equal, round, and reactive to light.  Neck: Normal range of motion. Neck supple. No thyromegaly present.  Cardiovascular: Normal rate, regular rhythm, normal heart sounds and intact distal pulses.   No murmur heard. Pulmonary/Chest: Effort normal and breath sounds normal. He has no wheezes. He has no rales.  Abdominal: Soft. Bowel sounds are normal.  Musculoskeletal: Normal range of motion.  Neurological: He is alert and oriented to person, place, and time.  Skin: Skin is warm and dry.  Psychiatric: He has a normal mood and affect. His behavior is normal. Judgment and thought content normal.   BP 134/85   Pulse 74   Temp 97.3 F (36.3 C) (Oral)   Ht _0  (1.778 m)   Wt 248 lb (112.5 kg)   BMI 35.58 kg/m    hgba1c 8.2% up form 7.44% at last visit      Assessment & Plan:   1. Essential hypertension, benign Low sodium diet - CMP14+EGFR - losartan-hydrochlorothiazide (HYZAAR) 100-25 MG tablet; TAKE ONE (1) TABLET EACH DAY  Dispense: 30 tablet; Refill: 5  2. Type 2 diabetes mellitus without complication, without long-term current use of insulin (HCC) Increase metformin from 587m bid to 10068mbid - Bayer DCA Hb A1c Waived - Microalbumin / creatinine urine ratio - metFORMIN (GLUCOPHAGE) 1000 MG tablet; Take 1 tablet (1,000 mg total) by mouth 2 (two) times daily with a meal.  Dispense: 180 tablet; Refill: 1  3. Hyperlipidemia with target LDL less than 100 Low fat diet - Lipid panel  4. OSA (obstructive sleep apnea) Continue CPAP nightly  5. Gastroesophageal reflux disease without esophagitis Avoid spicy foods Do not eat 2 hours prior to bedtime - pantoprazole (PROTONIX) 40 MG tablet; TAKE ONE (1) TABLET EACH DAY  Dispense: 30 tablet; Refill: 5  6. Recurrent major depressive disorder, in full remission ([place] management - escitalopram (LEXAPRO) 10 MG tablet; TAKE  ONE (1) TABLET EACH DAY  Dispense: 30 tablet; Refill: 1  7. Morbid obesity ([place]villeDiscussed diet and exercise for person with BMI >25 Will recheck weight in 3-6 months    Labs pending Health maintenance reviewed Diet and exercise encouraged Continue all meds Follow up  In 3 months   MaWalnut CreekFNP

## 2016-06-07 LAB — LIPID PANEL
Chol/HDL Ratio: 5.7 ratio units — ABNORMAL HIGH (ref 0.0–5.0)
Cholesterol, Total: 171 mg/dL (ref 100–199)
HDL: 30 mg/dL — ABNORMAL LOW (ref >39)
LDL Calculated: 82 mg/dL (ref 0–99)
Triglycerides: 295 mg/dL — ABNORMAL HIGH (ref 0–149)
VLDL Cholesterol Cal: 59 mg/dL — ABNORMAL HIGH (ref 5–40)

## 2016-06-07 LAB — CMP14+EGFR
ALT: 21 IU/L (ref 0–44)
AST: 13 IU/L (ref 0–40)
Albumin/Globulin Ratio: 1.6 (ref 1.2–2.2)
Albumin: 4.3 g/dL (ref 3.5–5.5)
Alkaline Phosphatase: 79 IU/L (ref 39–117)
BUN/Creatinine Ratio: 17 (ref 9–20)
BUN: 14 mg/dL (ref 6–24)
Bilirubin Total: 0.7 mg/dL (ref 0.0–1.2)
CO2: 28 mmol/L (ref 18–29)
Calcium: 9.3 mg/dL (ref 8.7–10.2)
Chloride: 95 mmol/L — ABNORMAL LOW (ref 96–106)
Creatinine, Ser: 0.82 mg/dL (ref 0.76–1.27)
GFR calc Af Amer: 116 mL/min/{1.73_m2} (ref >59)
GFR calc non Af Amer: 100 mL/min/{1.73_m2} (ref >59)
Globulin, Total: 2.7 g/dL (ref 1.5–4.5)
Glucose: 146 mg/dL — ABNORMAL HIGH (ref 65–99)
Potassium: 3.9 mmol/L (ref 3.5–5.2)
Sodium: 139 mmol/L (ref 134–144)
Total Protein: 7 g/dL (ref 6.0–8.5)

## 2016-07-21 ENCOUNTER — Encounter: Payer: Self-pay | Admitting: Nurse Practitioner

## 2016-07-21 ENCOUNTER — Ambulatory Visit (INDEPENDENT_AMBULATORY_CARE_PROVIDER_SITE_OTHER): Payer: BLUE CROSS/BLUE SHIELD | Admitting: Nurse Practitioner

## 2016-07-21 VITALS — BP 143/77 | HR 73 | Temp 97.8°F | Ht 70.0 in | Wt 247.0 lb

## 2016-07-21 DIAGNOSIS — H0011 Chalazion right upper eyelid: Secondary | ICD-10-CM | POA: Diagnosis not present

## 2016-07-21 MED ORDER — ERYTHROMYCIN 5 MG/GM OP OINT: 1.0000 "application " | TOPICAL_OINTMENT | Freq: Every day | OPHTHALMIC | 0 refills | Status: DC

## 2016-07-21 NOTE — Patient Instructions (Signed)
Chalazion A chalazion is a swelling or lump on the eyelid. It can affect the upper or lower eyelid. What are the causes? This condition may be caused by:  Long-lasting (chronic) inflammation of the eyelid glands.  A blocked oil gland in the eyelid. What are the signs or symptoms? Symptoms of this condition include:  A swelling on the eyelid. The swelling may spread to areas around the eye.  A hard lump on the eyelid. This lump may make it hard to see out of the eye. How is this diagnosed? This condition is diagnosed with an examination of the eye. How is this treated? This condition is treated by applying a warm compress to the eyelid. If the condition does not improve after two days, it may be treated with:  Surgery.  Medicine that is injected into the chalazion by a health care provider.  Medicine that is applied to the eye. Follow these instructions at home:  Do not touch the chalazion.  Do not try to remove the pus, such as by squeezing the chalazion or sticking it with a pin or needle.  Do not rub your eyes.  Wash your hands often. Dry your hands with a clean towel.  Keep your face, scalp, and eyebrows clean.  Avoid wearing eye makeup.  Apply a warm, moist compress to the eyelid 4-6 times a day for 10-15 minutes at a time. This will help to open any blocked glands and help to reduce redness and swelling.  Apply over-the-counter and prescription medicines only as told by your health care provider.  If the chalazion does not break open (rupture) on its own in a month, return to your health care provider.  Keep all follow-up appointments as told by your health care provider. This is important. Contact a health care provider if:  Your eyelid has not improved in 4 weeks.  Your eyelid is getting worse.  You have a fever.  The chalazion does not rupture on its own with home treatment in a month. Get help right away if:  You have pain in your eye.  Your vision  changes.  The chalazion becomes painful or red  The chalazion gets bigger. This information is not intended to replace advice given to you by your health care provider. Make sure you discuss any questions you have with your health care provider. Document Released: 03/04/2000 Document Revised: 08/13/2015 Document Reviewed: 06/30/2014 Elsevier Interactive Patient Education  2017 Elsevier Inc.  

## 2016-07-21 NOTE — Progress Notes (Signed)
   Subjective:    Patient ID: Clayton [name], male    DOB: 27-Mar-1960, 56 y.o.   MRN: [id number]  HPI  Patient comes in c/o right upper lid erythema ad edema- started 3 day sago and has gotten no better. The eye itself is nt red.   Review of Systems  Constitutional: Negative.   HENT: Negative.   Eyes: Positive for pain. Negative for photophobia, discharge, redness and visual disturbance.  Respiratory: Negative.   Cardiovascular: Negative.   Genitourinary: Negative.   Neurological: Negative.   Psychiatric/Behavioral: Negative.   All other systems reviewed and are negative.      Objective:   Physical Exam  Constitutional: He appears well-developed and well-nourished. No distress.  Eyes: Pupils are equal, round, and reactive to light. Right eye exhibits no discharge. Left eye exhibits no discharge. No scleral icterus.  Non injcted Right upper lid erythematous and swollen- sore to touch- no drainage noted  Cardiovascular: Normal rate.   Pulmonary/Chest: Effort normal.  Skin: Skin is warm.  Psychiatric: He has a normal mood and affect. His behavior is normal. Judgment and thought content normal.   BP (!) 143/77   Pulse 73   Temp 97.8 F (36.6 C) (Oral)   Ht 5\' 10"  (1.778 m)   Wt 247 lb (112 kg)   BMI 35.44 kg/m       Assessment & Plan:  Chalazion of right upper eyelid  Meds ordered this encounter  Medications  . erythromycin [facility]) ophthalmic ointment    Sig: Place 1 application QID    Dispense:  3.5 g    Refill:  0    Order Specific Question:   Supervising Provider    Answer:   Eustaquio Maize [[id number]]   Cool compresses Avoid rubbing May need oral meds if no better  Mary-Margaret Hassell Done, FNP

## 2016-09-12 ENCOUNTER — Ambulatory Visit (INDEPENDENT_AMBULATORY_CARE_PROVIDER_SITE_OTHER): Payer: BLUE CROSS/BLUE SHIELD | Admitting: Nurse Practitioner

## 2016-09-12 ENCOUNTER — Encounter: Payer: Self-pay | Admitting: Nurse Practitioner

## 2016-09-12 VITALS — BP 137/77 | HR 68 | Temp 97.5°F | Ht 70.0 in | Wt 247.0 lb

## 2016-09-12 DIAGNOSIS — G4733 Obstructive sleep apnea (adult) (pediatric): Secondary | ICD-10-CM | POA: Diagnosis not present

## 2016-09-12 DIAGNOSIS — F3342 Major depressive disorder, recurrent, in full remission: Secondary | ICD-10-CM | POA: Diagnosis not present

## 2016-09-12 DIAGNOSIS — E785 Hyperlipidemia, unspecified: Secondary | ICD-10-CM

## 2016-09-12 DIAGNOSIS — E119 Type 2 diabetes mellitus without complications: Secondary | ICD-10-CM

## 2016-09-12 DIAGNOSIS — I10 Essential (primary) hypertension: Secondary | ICD-10-CM | POA: Diagnosis not present

## 2016-09-12 DIAGNOSIS — H0011 Chalazion right upper eyelid: Secondary | ICD-10-CM

## 2016-09-12 DIAGNOSIS — M25512 Pain in left shoulder: Secondary | ICD-10-CM | POA: Diagnosis not present

## 2016-09-12 DIAGNOSIS — K219 Gastro-esophageal reflux disease without esophagitis: Secondary | ICD-10-CM

## 2016-09-12 LAB — BAYER DCA HB A1C WAIVED: HB A1C (BAYER DCA - WAIVED): 7.5 % — ABNORMAL HIGH (ref <7.0)

## 2016-09-12 MED ORDER — METFORMIN HCL 1000 MG PO TABS: 1000.0000 mg | ORAL_TABLET | Freq: Two times a day (BID) | ORAL | 1 refills | Status: DC

## 2016-09-12 MED ORDER — MELOXICAM 15 MG PO TABS: 15.0000 mg | ORAL_TABLET | Freq: Every day | ORAL | 0 refills | Status: DC

## 2016-09-12 MED ORDER — LOSARTAN POTASSIUM-HCTZ 100-25 MG PO TABS: ORAL_TABLET | ORAL | 1 refills | Status: DC

## 2016-09-12 MED ORDER — PANTOPRAZOLE SODIUM 40 MG PO TBEC: DELAYED_RELEASE_TABLET | ORAL | 1 refills | Status: DC

## 2016-09-12 MED ORDER — ESCITALOPRAM OXALATE 10 MG PO TABS: ORAL_TABLET | ORAL | 1 refills | Status: DC

## 2016-09-12 NOTE — Addendum Note (Signed)
Addended by: Chevis Pretty on: 09/12/2016 03:42 PM   Modules accepted: Orders

## 2016-09-12 NOTE — Patient Instructions (Signed)
Shoulder Pain Many things can cause shoulder pain, including:  An injury.  Moving the arm in the same way again and again (overuse).  Joint pain (arthritis).  Follow these instructions at home: Take these actions to help with your pain:  Squeeze a soft ball or a foam pad as much as you can. This helps to prevent swelling. It also makes the arm stronger.  Take over-the-counter and prescription medicines only as told by your doctor.  If told, put ice on the area: ? Put ice in a plastic bag. ? Place a towel between your skin and the bag. ? Leave the ice on for 20 minutes, 2-3 times per day. Stop putting on ice if it does not help with the pain.  If you were given a shoulder sling or immobilizer: ? Wear it as told. ? Remove it to shower or bathe. ? Move your arm as little as possible. ? Keep your hand moving. This helps prevent swelling.  Contact a doctor if:  Your pain gets worse.  Medicine does not help your pain.  You have new pain in your arm, hand, or fingers. Get help right away if:  Your arm, hand, or fingers: ? Tingle. ? Are numb. ? Are swollen. ? Are painful. ? Turn white or blue. This information is not intended to replace advice given to you by your health care provider. Make sure you discuss any questions you have with your health care provider. Document Released: 08/24/2007 Document Revised: 11/01/2015 Document Reviewed: 06/30/2014 Elsevier Interactive Patient Education  2018 Elsevier Inc.  

## 2016-09-12 NOTE — Progress Notes (Signed)
Subjective:    Patient ID: Clayton [name], male    DOB: 06/25/1960, 56 y.o.   MRN: [id number]  HPI Clayton [name] is here today for follow up of chronic medical problem.  Outpatient Encounter Prescriptions as of 09/12/2016  Medication Sig  . aspirin 81 MG EC tablet Take 1 tablet (81 mg total) by mouth daily. Swallow whole.  . escitalopram (LEXAPRO) 10 MG tablet TAKE ONE (1) TABLET EACH DAY  . glucose blood test strip Use to check BG 1 to 2 times daily.  One Touch Verio IQ test strips.  DX:  E11.9  . losartan-hydrochlorothiazide (HYZAAR) 100-25 MG tablet TAKE ONE (1) TABLET EACH DAY  . metFORMIN (GLUCOPHAGE) 1000 MG tablet Take 1 tablet (1,000 mg total) by mouth 2 (two) times daily with a meal.  . ONETOUCH DELICA LANCETS 29H MISC Use to check BG once or twice daily.  Dx:  E11.9  . pantoprazole (PROTONIX) 40 MG tablet TAKE ONE (1) TABLET EACH DAY  . sildenafil (REVATIO) 20 MG tablet 2-3 po prn     1. Essential hypertension, benign  No c/o chest pain,SOB or HA- does not check blood pressure at home  2. Type 2 diabetes mellitus without complication, without long-term current use of insulin ([name]) last hgba1c was 8.3%. We increased metformin to '1000mg'$  bid from '500mg'$  BID- fastiung blood sugars have come down some but patient does not check everyday  3. Hyperlipidemia with target LDL less than 100  Does not watch diet  4. OSA (obstructive sleep apnea)  Wears CPAP nightly  5. Gastroesophageal reflux disease without esophagitis  protonix works well to keep symptoms under control  6. Morbid obesity ([name])  No change in weight since last visit  7. Recurrent major depressive disorder, in full remission ([name])  Takes lexapro and is working well without side effects Depression screen [name] 2/9 09/12/2016 07/21/2016 06/06/2016 03/07/2016 02/22/2016  Decreased Interest 0 0 0 0 0  Down, Depressed, Hopeless 0 0 0 0 0  PHQ - 2 Score 0 0 0 0 0       New complaints: * was seen for chalazion on right  upper lid- pain went away but lesions has not gotten any better. * left shoulder and arm pain- started about 2 weeks ago. Pain randges from 3-8/10. Radiates all the way down to fingers. Is worse at night whne trying to sleep. Certain positions will ease pain. Denies any injury.    Review of Systems  Constitutional: Negative for activity change and appetite change.  HENT: Negative.   Eyes: Negative for pain.  Respiratory: Negative for shortness of breath.   Cardiovascular: Negative for chest pain, palpitations and leg swelling.  Gastrointestinal: Negative for abdominal pain.  Endocrine: Negative for polydipsia.  Genitourinary: Negative.   Skin: Negative for rash.  Neurological: Negative for dizziness, weakness and headaches.  Hematological: Does not bruise/bleed easily.  Psychiatric/Behavioral: Negative.   All other systems reviewed and are negative.      Objective:   Physical Exam  Constitutional: He is oriented to person, place, and time. He appears well-developed and well-nourished.  HENT:  Head: Normocephalic.  Right Ear: External ear normal.  Left Ear: External ear normal.  Nose: Nose normal.  Mouth/Throat: Oropharynx is clear and moist.  Eyes: EOM are normal. Pupils are equal, round, and reactive to light.  Neck: Normal range of motion. Neck supple. No JVD present. No thyromegaly present.  Cardiovascular: Normal rate, regular rhythm, normal heart sounds and intact distal pulses.  Exam reveals no gallop and no friction rub.   No murmur heard. Pulmonary/Chest: Effort normal and breath sounds normal. No respiratory distress. He has no wheezes. He has no rales. He exhibits no tenderness.  Abdominal: Soft. Bowel sounds are normal. He exhibits no mass. There is no tenderness.  Musculoskeletal: Normal range of motion. He exhibits no edema.  FROM of left shoulder with pain on full extesion and abduction Grips equal bil  Lymphadenopathy:    He has no cervical adenopathy.    Neurological: He is alert and oriented to person, place, and time. He has normal reflexes. No cranial nerve deficit.  Skin: Skin is warm and dry.  Psychiatric: He has a normal mood and affect. His behavior is normal. Judgment and thought content normal.    BP 137/77   Pulse 68   Temp 97.5 F (36.4 C) (Oral)   Ht '5\' 10"'$  (1.778 m)   Wt 247 lb (112 kg)   BMI 35.44 kg/m   HGBA1c  7.5%     Assessment & Plan:  1. Essential hypertension, benign Low sodium diet - CMP14+EGFR - losartan-hydrochlorothiazide (HYZAAR) 100-25 MG tablet; TAKE ONE (1) TABLET EACH DAY  Dispense: 90 tablet; Refill: 1  2. Type 2 diabetes mellitus without complication, without long-term current use of insulin (HCC) Continue to watch carbs in diet - Bayer DCA Hb A1c Waived - metFORMIN (GLUCOPHAGE) 1000 MG tablet; Take 1 tablet (1,000 mg total) by mouth 2 (two) times daily with a meal.  Dispense: 180 tablet; Refill: 1  3. Hyperlipidemia with target LDL less than 100 Low fat diet - Lipid panel  4. OSA (obstructive sleep apnea) Wear CPAP nightly  5. Gastroesophageal reflux disease without esophagitis Avoid spicy foods Do not eat 2 hours prior to bedtime - pantoprazole (PROTONIX) 40 MG tablet; TAKE ONE (1) TABLET EACH DAY  Dispense: 90 tablet; Refill: 1  6. Morbid obesity ([name]) Discussed diet and exercise for person with BMI >25 Will recheck weight in 3-6 months  7. Recurrent major depressive disorder, in full remission ([name]) Stress management - escitalopram (LEXAPRO) 10 MG tablet; TAKE ONE (1) TABLET EACH DAY  Dispense: 90 tablet; Refill: 1  8. Acute pain of left shoulder Rest mosit heat bid If no improvement will need to se ortho - meloxicam (MOBIC) 15 MG tablet; Take 1 tablet (15 mg total) by mouth daily.  Dispense: 30 tablet; Refill: 0    Labs pending Health maintenance reviewed Diet and exercise encouraged Continue all meds Follow up  In 3 months and prn   Mary-Margaret Hassell Done, FNP

## 2016-09-13 LAB — CMP14+EGFR
ALT: 21 IU/L (ref 0–44)
AST: 12 IU/L (ref 0–40)
Albumin/Globulin Ratio: 1.5 (ref 1.2–2.2)
Albumin: 4.4 g/dL (ref 3.5–5.5)
Alkaline Phosphatase: 73 IU/L (ref 39–117)
BUN/Creatinine Ratio: 17 (ref 9–20)
BUN: 14 mg/dL (ref 6–24)
Bilirubin Total: 0.8 mg/dL (ref 0.0–1.2)
CO2: 25 mmol/L (ref 20–29)
Calcium: 9.5 mg/dL (ref 8.7–10.2)
Chloride: 99 mmol/L (ref 96–106)
Creatinine, Ser: 0.84 mg/dL (ref 0.76–1.27)
GFR calc Af Amer: 115 mL/min/{1.73_m2} (ref >59)
GFR calc non Af Amer: 99 mL/min/{1.73_m2} (ref >59)
Globulin, Total: 2.9 g/dL (ref 1.5–4.5)
Glucose: 139 mg/dL — ABNORMAL HIGH (ref 65–99)
Potassium: 4.1 mmol/L (ref 3.5–5.2)
Sodium: 140 mmol/L (ref 134–144)
Total Protein: 7.3 g/dL (ref 6.0–8.5)

## 2016-09-13 LAB — LIPID PANEL
Chol/HDL Ratio: 5.7 ratio — ABNORMAL HIGH (ref 0.0–5.0)
Cholesterol, Total: 177 mg/dL (ref 100–199)
HDL: 31 mg/dL — ABNORMAL LOW (ref >39)
LDL Calculated: 95 mg/dL (ref 0–99)
Triglycerides: 256 mg/dL — ABNORMAL HIGH (ref 0–149)
VLDL Cholesterol Cal: 51 mg/dL — ABNORMAL HIGH (ref 5–40)

## 2016-12-12 ENCOUNTER — Ambulatory Visit: Payer: BLUE CROSS/BLUE SHIELD | Admitting: Nurse Practitioner

## 2017-01-02 ENCOUNTER — Ambulatory Visit: Payer: BLUE CROSS/BLUE SHIELD | Admitting: Nurse Practitioner

## 2017-01-09 ENCOUNTER — Ambulatory Visit (INDEPENDENT_AMBULATORY_CARE_PROVIDER_SITE_OTHER): Payer: BLUE CROSS/BLUE SHIELD | Admitting: Nurse Practitioner

## 2017-01-09 ENCOUNTER — Encounter: Payer: Self-pay | Admitting: Nurse Practitioner

## 2017-01-09 VITALS — BP 123/72 | HR 82 | Temp 97.3°F | Ht 70.0 in | Wt 249.0 lb

## 2017-01-09 DIAGNOSIS — I10 Essential (primary) hypertension: Secondary | ICD-10-CM | POA: Diagnosis not present

## 2017-01-09 DIAGNOSIS — G4733 Obstructive sleep apnea (adult) (pediatric): Secondary | ICD-10-CM

## 2017-01-09 DIAGNOSIS — E785 Hyperlipidemia, unspecified: Secondary | ICD-10-CM

## 2017-01-09 DIAGNOSIS — F3342 Major depressive disorder, recurrent, in full remission: Secondary | ICD-10-CM

## 2017-01-09 DIAGNOSIS — E119 Type 2 diabetes mellitus without complications: Secondary | ICD-10-CM | POA: Diagnosis not present

## 2017-01-09 DIAGNOSIS — K219 Gastro-esophageal reflux disease without esophagitis: Secondary | ICD-10-CM | POA: Diagnosis not present

## 2017-01-09 DIAGNOSIS — M25512 Pain in left shoulder: Secondary | ICD-10-CM

## 2017-01-09 LAB — BAYER DCA HB A1C WAIVED: HB A1C (BAYER DCA - WAIVED): 7 % — ABNORMAL HIGH (ref <7.0)

## 2017-01-09 MED ORDER — GLUCOSE BLOOD VI STRP: ORAL_STRIP | 2 refills | Status: DC

## 2017-01-09 MED ORDER — MELOXICAM 15 MG PO TABS: 15.0000 mg | ORAL_TABLET | Freq: Every day | ORAL | 0 refills | Status: DC

## 2017-01-09 NOTE — Patient Instructions (Signed)

## 2017-01-09 NOTE — Progress Notes (Signed)
 Subjective:    Patient ID: Clayton [name], male    DOB: 09/20/1960, 55 y.o.   MRN: [id number]  HPI   Clayton [name] is here today for follow up of chronic medical problem.  Outpatient Encounter Prescriptions as of 01/09/2017  Medication Sig  . aspirin 81 MG EC tablet Take 1 tablet (81 mg total) by mouth daily. Swallow whole.  . escitalopram (LEXAPRO) 10 MG tablet TAKE ONE (1) TABLET EACH DAY  . glucose blood test strip Use to check BG 1 to 2 times daily.  One Touch Verio IQ test strips.  DX:  E11.9  . losartan-hydrochlorothiazide (HYZAAR) 100-25 MG tablet TAKE ONE (1) TABLET EACH DAY  . meloxicam (MOBIC) 15 MG tablet Take 1 tablet (15 mg total) by mouth daily.  . metFORMIN (GLUCOPHAGE) 1000 MG tablet Take 1 tablet (1,000 mg total) by mouth 2 (two) times daily with a meal.  . ONETOUCH DELICA LANCETS 33G MISC Use to check BG once or twice daily.  Dx:  E11.9      . sildenafil (REVATIO) 20 MG tablet 2-3 po prn (Patient not taking: Reported on 01/09/2017)   No facility-administered encounter medications on file as of 01/09/2017.     1. Essential hypertension, benign  No c/o chest pain, SOB or headache. Does not check blood pressure at home. BP Readings from Last 3 Encounters:  01/09/17 123/72  09/12/16 137/77  07/21/16 (!) 143/77     2. Type 2 diabetes mellitus without complication, without long-term current use of insulin (HCC) last HGBAc1c was 7.5%. No changes in meds at last appointment. Fasting blood sugars running 110-120. No hypogylcemia  3. Hyperlipidemia with target LDL less than 100  Does not watch diet  4. OSA (obstructive sleep apnea)  Wears CPAP nightly  5. Gastroesophageal reflux disease without esophagitis  No problems right now- is on no meds  6. Morbid obesity (HCC)  No recent weight changes  7. Recurrent major depressive disorder, in full remission (HCC)  On lexapro- working well y=to keep him calm and from being upset  8.     erectle dysfunction  Sildenafil 20mg works well but sometimes gives him a headache.  New complaints: None today  Social history: Lives with wife- wife owns [employer]   Review of Systems  Constitutional: Negative for activity change and appetite change.  HENT: Negative.   Eyes: Negative for pain.  Respiratory: Negative for shortness of breath.   Cardiovascular: Negative for chest pain, palpitations and leg swelling.  Gastrointestinal: Negative for abdominal pain.  Endocrine: Negative for polydipsia.  Genitourinary: Negative.   Skin: Negative for rash.  Neurological: Negative for dizziness, weakness and headaches.  Hematological: Does not bruise/bleed easily.  Psychiatric/Behavioral: Negative.   All other systems reviewed and are negative.      Objective:   Physical Exam  Constitutional: He is oriented to person, place, and time. He appears well-developed and well-nourished.  HENT:  Head: Normocephalic.  Right Ear: External ear normal.  Left Ear: External ear normal.  Nose: Nose normal.  Mouth/Throat: Oropharynx is clear and moist.  Eyes: Pupils are equal, round, and reactive to light. EOM are normal.  Neck: Normal range of motion. Neck supple. No JVD present. No thyromegaly present.  Cardiovascular: Normal rate, regular rhythm, normal heart sounds and intact distal pulses.  Exam reveals no gallop and no friction rub.   No murmur heard. Pulmonary/Chest: Effort normal and breath sounds normal. No respiratory distress. He has no wheezes. He has   no rales. He exhibits no tenderness.  Abdominal: Soft. Bowel sounds are normal. He exhibits no mass. There is no tenderness.  Genitourinary: Prostate normal and penis normal.  Musculoskeletal: Normal range of motion. He exhibits no edema.  Lymphadenopathy:    He has no cervical adenopathy.  Neurological: He is alert and oriented to person, place, and time. No cranial nerve deficit.  Skin: Skin is warm and dry.  Psychiatric: He has a normal mood  and affect. His behavior is normal. Judgment and thought content normal.   BP 123/72   Pulse 82   Temp (!) 97.3 F (36.3 C) (Oral)   Ht 5' 10" (1.778 m)   Wt 249 lb (112.9 kg)   BMI 35.73 kg/m   hgba1c 7.0% today      Assessment & Plan:  1. Essential hypertension, benign Low sodium diet - CMP14+EGFR  2. Type 2 diabetes mellitus without complication, without long-term current use of insulin (HCC) Continue carb counting - Bayer DCA Hb A1c Waived - glucose blood test strip; Use to check BG 1 to 2 times daily.  One Touch Verio IQ test strips.  DX:  E11.9  Dispense: 100 each; Refill: 2  3. Hyperlipidemia with target LDL less than 100 Low fat diet - Lipid panel  4. OSA (obstructive sleep apnea) Continue to wear CPAP  5. Gastroesophageal reflux disease without esophagitis Avoid spicy foods Do not eat 2 hours prior to bedtime  6. Morbid obesity ([place]) Discussed diet and exercise for person with BMI >25 Will recheck weight in 3-6 months  7. Recurrent major depressive disorder, in full remission ([place]) Stress management  8. Acute pain of left shoulder Moist heat rest - meloxicam (MOBIC) 15 MG tablet; Take 1 tablet (15 mg total) by mouth daily.  Dispense: 30 tablet; Refill: 0    Labs pending Health maintenance reviewed Diet and exercise encouraged Continue all meds Follow up  In 3 months   [place], FNP

## 2017-01-10 LAB — LIPID PANEL
Chol/HDL Ratio: 6 ratio — ABNORMAL HIGH (ref 0.0–5.0)
Cholesterol, Total: 179 mg/dL (ref 100–199)
HDL: 30 mg/dL — ABNORMAL LOW (ref >39)
LDL Calculated: 88 mg/dL (ref 0–99)
Triglycerides: 304 mg/dL — ABNORMAL HIGH (ref 0–149)
VLDL Cholesterol Cal: 61 mg/dL — ABNORMAL HIGH (ref 5–40)

## 2017-01-10 LAB — CMP14+EGFR
ALT: 17 IU/L (ref 0–44)
AST: 11 IU/L (ref 0–40)
Albumin/Globulin Ratio: 1.7 (ref 1.2–2.2)
Albumin: 4.7 g/dL (ref 3.5–5.5)
Alkaline Phosphatase: 78 IU/L (ref 39–117)
BUN/Creatinine Ratio: 19 (ref 9–20)
BUN: 16 mg/dL (ref 6–24)
Bilirubin Total: 1.1 mg/dL (ref 0.0–1.2)
CO2: 28 mmol/L (ref 20–29)
Calcium: 9.6 mg/dL (ref 8.7–10.2)
Chloride: 94 mmol/L — ABNORMAL LOW (ref 96–106)
Creatinine, Ser: 0.84 mg/dL (ref 0.76–1.27)
GFR calc Af Amer: 115 mL/min/{1.73_m2} (ref >59)
GFR calc non Af Amer: 99 mL/min/{1.73_m2} (ref >59)
Globulin, Total: 2.7 g/dL (ref 1.5–4.5)
Glucose: 126 mg/dL — ABNORMAL HIGH (ref 65–99)
Potassium: 4.2 mmol/L (ref 3.5–5.2)
Sodium: 136 mmol/L (ref 134–144)
Total Protein: 7.4 g/dL (ref 6.0–8.5)

## 2017-02-13 ENCOUNTER — Encounter: Payer: Self-pay | Admitting: Nurse Practitioner

## 2017-02-13 ENCOUNTER — Ambulatory Visit (INDEPENDENT_AMBULATORY_CARE_PROVIDER_SITE_OTHER): Payer: BLUE CROSS/BLUE SHIELD | Admitting: Nurse Practitioner

## 2017-02-13 DIAGNOSIS — Z024 Encounter for examination for driving license: Secondary | ICD-10-CM

## 2017-02-13 NOTE — Progress Notes (Signed)
Patient ID: Clayton [name], male   DOB: Sep 01, 1960, 56 y.o.   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

## 2017-02-20 ENCOUNTER — Other Ambulatory Visit: Payer: Self-pay | Admitting: Nurse Practitioner

## 2017-02-20 DIAGNOSIS — K219 Gastro-esophageal reflux disease without esophagitis: Secondary | ICD-10-CM

## 2017-02-20 DIAGNOSIS — F3342 Major depressive disorder, recurrent, in full remission: Secondary | ICD-10-CM

## 2017-02-20 DIAGNOSIS — I10 Essential (primary) hypertension: Secondary | ICD-10-CM

## 2017-02-20 DIAGNOSIS — M25512 Pain in left shoulder: Secondary | ICD-10-CM

## 2017-04-18 ENCOUNTER — Ambulatory Visit: Payer: BLUE CROSS/BLUE SHIELD | Admitting: Nurse Practitioner

## 2017-04-21 ENCOUNTER — Ambulatory Visit: Payer: BLUE CROSS/BLUE SHIELD | Admitting: Family Medicine

## 2017-04-21 ENCOUNTER — Ambulatory Visit: Payer: BLUE CROSS/BLUE SHIELD | Admitting: Nurse Practitioner

## 2017-04-21 ENCOUNTER — Ambulatory Visit (INDEPENDENT_AMBULATORY_CARE_PROVIDER_SITE_OTHER): Payer: BLUE CROSS/BLUE SHIELD

## 2017-04-21 ENCOUNTER — Encounter: Payer: Self-pay | Admitting: Family Medicine

## 2017-04-21 VITALS — BP 139/80 | HR 80 | Temp 99.5°F | Ht 70.0 in | Wt 259.0 lb

## 2017-04-21 DIAGNOSIS — M25561 Pain in right knee: Secondary | ICD-10-CM

## 2017-04-21 DIAGNOSIS — S838X1A Sprain of other specified parts of right knee, initial encounter: Secondary | ICD-10-CM

## 2017-04-21 NOTE — Progress Notes (Signed)
BP 139/80   Pulse 80   Temp 99.5 F (37.5 C) (Oral)   Ht 5\' 10"  (1.778 m)   Wt 259 lb (117.5 kg)   BMI 37.16 kg/m    Subjective:    Patient ID: Clayton [name], male    DOB: 04-Mar-1961, 57 y.o.   MRN: [id number]  HPI: Clayton [name] is a 57 y.o. male presenting on 04/21/2017 for Right knee pain (x 2 weeks, has been painful and swelling, cannot bend , no known injury)   HPI Patient presents to the clinic with right knee pain x 2 weeks. Patient denies any history of injury. He describes the pain as throbbing and rates it a 7/10 which is located on the medial aspect of the leg above the patella and lateral aspect of the legs below the patella. Pain radiates down the lateral aspect of his right leg randomly with a sharp shooting pain to the ankle. Pain also radiates up to his right hip & buttocks region. He is having difficulties walking because he will randomly hear a pop and then feels like his leg is going to give out, but it does not occur every time he walks. Patient has tried Ibuprofen which relieves the pain for a few hours. He also finds relief with fully extending his right leg. Pain is aggravated by walking up stairs and flexing his knee. He had ACL surgery on his left knee in 2012 and notes that he started to favor his right knee until it started to bother him. Patient works on concrete floors which has increased his knee pain. Patient admits to slight swelling and redness of right knee. Patient denies fever, chills, and numbness/tingling.  Relevant past medical, surgical, family and social history reviewed and updated as indicated. Interim medical history since our last visit reviewed. Allergies and medications reviewed and updated.  Review of Systems  Constitutional: Negative for chills, diaphoresis and fever.  Respiratory: Negative for shortness of breath.   Cardiovascular: Negative for chest pain.  Musculoskeletal: Positive for gait problem, joint swelling and myalgias.    Neurological: Negative for numbness.    Per HPI unless specifically indicated above   Allergies as of 04/21/2017   No Known Allergies     Medication List        Accurate as of 04/21/17  5:00 PM. Always use your most recent med list.          aspirin 81 MG EC tablet Take 1 tablet (81 mg total) by mouth daily. Swallow whole.   escitalopram 10 MG tablet Commonly known as:  LEXAPRO TAKE ONE (1) TABLET EACH DAY   glucose blood test strip Use to check BG 1 to 2 times daily.  One Touch Verio IQ test strips.  DX:  E11.9   losartan-hydrochlorothiazide 100-25 MG tablet Commonly known as:  HYZAAR TAKE ONE (1) TABLET EACH DAY   meloxicam 15 MG tablet Commonly known as:  MOBIC TAKE ONE (1) TABLET EACH DAY   metFORMIN 1000 MG tablet Commonly known as:  GLUCOPHAGE Take 1 tablet (1,000 mg total) by mouth 2 (two) times daily with a meal.   ONETOUCH DELICA LANCETS 08Q Misc Use to check BG once or twice daily.  Dx:  E11.9   pantoprazole 40 MG tablet Commonly known as:  PROTONIX TAKE ONE (1) TABLET EACH DAY   sildenafil 20 MG tablet Commonly known as:  REVATIO 2-3 po prn          Objective:  BP 139/80   Pulse 80   Temp 99.5 F (37.5 C) (Oral)   Ht 5\' 10"  (1.778 m)   Wt 259 lb (117.5 kg)   BMI 37.16 kg/m   Wt Readings from Last 3 Encounters:  04/21/17 259 lb (117.5 kg)  01/09/17 249 lb (112.9 kg)  09/12/16 247 lb (112 kg)    Physical Exam  Constitutional: He is oriented to person, place, and time. He appears well-developed and well-nourished. No distress.  Cardiovascular: Normal rate, regular rhythm and normal heart sounds.  Pulmonary/Chest: Effort normal and breath sounds normal.  Musculoskeletal:       Right hip: Normal.       Right knee: He exhibits swelling, effusion (slight ), erythema and abnormal meniscus. He exhibits normal range of motion, no deformity, normal alignment, no LCL laxity and no MCL laxity. Tenderness found. Lateral joint line tenderness  noted.       Right ankle: Normal.  Neurological: He is alert and oriented to person, place, and time.  Skin: He is not diaphoretic.  Psychiatric: He has a normal mood and affect. His behavior is normal. Judgment and thought content normal.        Assessment & Plan:  Right Knee X-Ray: Slight joint narrowing in both left and right knees Problem List Items Addressed This Visit    None    Visit Diagnoses    Acute pain of right knee    -  Primary   Relevant Orders   DG Knee 1-2 Views Right (Completed)   Ambulatory referral to Orthopedic Surgery   Acute medial meniscal injury of knee, right, initial encounter       Will try anti-inflammatories and refer him to orthopedic   Relevant Orders   DG Knee 1-2 Views Right (Completed)   Ambulatory referral to Orthopedic Surgery    Acute Medial Meniscal Injury: Patient presents to clinic with right knee pain x 2 weeks. He does not remember an injury. On physical exam, he has a + McMurray's for the medial meniscus. There was slight edema and erythema on the right knee. Patient has been instructed to continue taking NSAIDs to decrease inflammation and to help with pain. Patient will be referred to Orthopedic for further evaluation and possible surgery.  Follow up plan: Return if symptoms worsen or fail to improve.  Counseling provided for all of the vaccine components Orders Placed This Encounter  Procedures  . DG Knee 1-2 Views Right  . Ambulatory referral to Orthopedic Surgery   Patient was seen and examined with Lanna Poche PA student, patient has already tried ibuprofen and does not want to continue with any conservative measures and would prefer to go see orthopedic.  Based on exam it appears that he has medial meniscus injury. Caryl Pina, MD [facility] 04/24/2017, 10:04 PM

## 2017-05-22 ENCOUNTER — Other Ambulatory Visit: Payer: Self-pay | Admitting: Nurse Practitioner

## 2017-05-22 DIAGNOSIS — E119 Type 2 diabetes mellitus without complications: Secondary | ICD-10-CM

## 2017-08-28 ENCOUNTER — Other Ambulatory Visit: Payer: Self-pay | Admitting: Nurse Practitioner

## 2017-08-28 DIAGNOSIS — K219 Gastro-esophageal reflux disease without esophagitis: Secondary | ICD-10-CM

## 2017-08-28 DIAGNOSIS — F3342 Major depressive disorder, recurrent, in full remission: Secondary | ICD-10-CM

## 2017-08-28 DIAGNOSIS — M25512 Pain in left shoulder: Secondary | ICD-10-CM

## 2017-08-28 DIAGNOSIS — I10 Essential (primary) hypertension: Secondary | ICD-10-CM

## 2017-08-28 NOTE — Telephone Encounter (Signed)
Attempted to contact patient - NVM 

## 2017-08-28 NOTE — Telephone Encounter (Signed)
Refills denied- NTBS

## 2017-08-30 NOTE — Telephone Encounter (Signed)
appt made

## 2017-08-31 ENCOUNTER — Ambulatory Visit: Payer: BLUE CROSS/BLUE SHIELD | Admitting: Nurse Practitioner

## 2017-09-06 ENCOUNTER — Encounter: Payer: Self-pay | Admitting: Nurse Practitioner

## 2018-01-11 ENCOUNTER — Telehealth: Payer: Self-pay | Admitting: *Deleted

## 2018-01-11 DIAGNOSIS — I10 Essential (primary) hypertension: Secondary | ICD-10-CM

## 2018-01-11 DIAGNOSIS — F3342 Major depressive disorder, recurrent, in full remission: Secondary | ICD-10-CM

## 2018-01-11 DIAGNOSIS — E119 Type 2 diabetes mellitus without complications: Secondary | ICD-10-CM

## 2018-01-11 DIAGNOSIS — K219 Gastro-esophageal reflux disease without esophagitis: Secondary | ICD-10-CM

## 2018-01-11 MED ORDER — PANTOPRAZOLE SODIUM 40 MG PO TBEC: DELAYED_RELEASE_TABLET | ORAL | 0 refills | Status: DC

## 2018-01-11 MED ORDER — ESCITALOPRAM OXALATE 10 MG PO TABS: ORAL_TABLET | ORAL | 0 refills | Status: DC

## 2018-01-11 MED ORDER — METFORMIN HCL 1000 MG PO TABS: ORAL_TABLET | ORAL | 0 refills | Status: DC

## 2018-01-11 MED ORDER — LOSARTAN POTASSIUM-HCTZ 100-25 MG PO TABS: ORAL_TABLET | ORAL | 0 refills | Status: DC

## 2018-01-11 NOTE — Telephone Encounter (Signed)
Pt aware refills sent to pharmacy 

## 2018-01-11 NOTE — Addendum Note (Signed)
Addended by: Antonietta Barcelona D on: 01/11/2018 05:01 PM   Modules accepted: Orders

## 2018-02-12 ENCOUNTER — Ambulatory Visit (INDEPENDENT_AMBULATORY_CARE_PROVIDER_SITE_OTHER): Payer: BLUE CROSS/BLUE SHIELD | Admitting: Nurse Practitioner

## 2018-02-12 ENCOUNTER — Encounter: Payer: Self-pay | Admitting: Nurse Practitioner

## 2018-02-12 VITALS — BP 155/85 | HR 69 | Temp 97.4°F | Ht 70.0 in | Wt 257.0 lb

## 2018-02-12 DIAGNOSIS — E785 Hyperlipidemia, unspecified: Secondary | ICD-10-CM

## 2018-02-12 DIAGNOSIS — K219 Gastro-esophageal reflux disease without esophagitis: Secondary | ICD-10-CM

## 2018-02-12 DIAGNOSIS — F3342 Major depressive disorder, recurrent, in full remission: Secondary | ICD-10-CM

## 2018-02-12 DIAGNOSIS — E119 Type 2 diabetes mellitus without complications: Secondary | ICD-10-CM | POA: Diagnosis not present

## 2018-02-12 DIAGNOSIS — G4733 Obstructive sleep apnea (adult) (pediatric): Secondary | ICD-10-CM

## 2018-02-12 DIAGNOSIS — I10 Essential (primary) hypertension: Secondary | ICD-10-CM | POA: Diagnosis not present

## 2018-02-12 LAB — BAYER DCA HB A1C WAIVED: HB A1C (BAYER DCA - WAIVED): 8.3 % — ABNORMAL HIGH (ref <7.0)

## 2018-02-12 MED ORDER — ESCITALOPRAM OXALATE 10 MG PO TABS: ORAL_TABLET | ORAL | 1 refills | Status: DC

## 2018-02-12 MED ORDER — LOSARTAN POTASSIUM-HCTZ 100-25 MG PO TABS: ORAL_TABLET | ORAL | 1 refills | Status: DC

## 2018-02-12 MED ORDER — PANTOPRAZOLE SODIUM 40 MG PO TBEC: DELAYED_RELEASE_TABLET | ORAL | 1 refills | Status: DC

## 2018-02-12 MED ORDER — METFORMIN HCL 1000 MG PO TABS: ORAL_TABLET | ORAL | 1 refills | Status: DC

## 2018-02-12 MED ORDER — SITAGLIPTIN PHOSPHATE 100 MG PO TABS: 100.0000 mg | ORAL_TABLET | Freq: Every day | ORAL | 0 refills | Status: DC

## 2018-02-12 NOTE — Patient Instructions (Signed)
Stress and Stress Management Stress is a normal reaction to life events. It is what you feel when life demands more than you are used to or more than you can handle. Some stress can be useful. For example, the stress reaction can help you catch the last bus of the day, study for a test, or meet a deadline at work. But stress that occurs too often or for too long can cause problems. It can affect your emotional health and interfere with relationships and normal daily activities. Too much stress can weaken your immune system and increase your risk for physical illness. If you already have a medical problem, stress can make it worse. What are the causes? All sorts of life events may cause stress. An event that causes stress for one person may not be stressful for another person. Major life events commonly cause stress. These may be positive or negative. Examples include losing your job, moving into a new home, getting married, having a baby, or losing a loved one. Less obvious life events may also cause stress, especially if they occur day after day or in combination. Examples include working long hours, driving in traffic, caring for children, being in debt, or being in a difficult relationship. What are the signs or symptoms? Stress may cause emotional symptoms including, the following:  Anxiety. This is feeling worried, afraid, on edge, overwhelmed, or out of control.  Anger. This is feeling irritated or impatient.  Depression. This is feeling sad, down, helpless, or guilty.  Difficulty focusing, remembering, or making decisions.  Stress may cause physical symptoms, including the following:  Aches and pains. These may affect your head, neck, back, stomach, or other areas of your body.  Tight muscles or clenched jaw.  Low energy or trouble sleeping.  Stress may cause unhealthy behaviors, including the following:  Eating to feel better (overeating) or skipping meals.  Sleeping too little,  too much, or both.  Working too much or putting off tasks (procrastination).  Smoking, drinking alcohol, or using drugs to feel better.  How is this diagnosed? Stress is diagnosed through an assessment by your health care provider. Your health care provider will ask questions about your symptoms and any stressful life events.Your health care provider will also ask about your medical history and may order blood tests or other tests. Certain medical conditions and medicine can cause physical symptoms similar to stress. Mental illness can cause emotional symptoms and unhealthy behaviors similar to stress. Your health care provider may refer you to a mental health professional for further evaluation. How is this treated? Stress management is the recommended treatment for stress.The goals of stress management are reducing stressful life events and coping with stress in healthy ways. Techniques for reducing stressful life events include the following:  Stress identification. Self-monitor for stress and identify what causes stress for you. These skills may help you to avoid some stressful events.  Time management. Set your priorities, keep a calendar of events, and learn to say "no." These tools can help you avoid making too many commitments.  Techniques for coping with stress include the following:  Rethinking the problem. Try to think realistically about stressful events rather than ignoring them or overreacting. Try to find the positives in a stressful situation rather than focusing on the negatives.  Exercise. Physical exercise can release both physical and emotional tension. The key is to find a form of exercise you enjoy and do it regularly.  Relaxation techniques. These relax the body and  mind. Examples include yoga, meditation, tai chi, biofeedback, deep breathing, progressive muscle relaxation, listening to music, being out in nature, journaling, and other hobbies. Again, the key is to find  one or more that you enjoy and can do regularly.  Healthy lifestyle. Eat a balanced diet, get plenty of sleep, and do not smoke. Avoid using alcohol or drugs to relax.  Strong support network. Spend time with family, friends, or other people you enjoy being around.Express your feelings and talk things over with someone you trust.  Counseling or talktherapy with a mental health professional may be helpful if you are having difficulty managing stress on your own. Medicine is typically not recommended for the treatment of stress.Talk to your health care provider if you think you need medicine for symptoms of stress. Follow these instructions at home:  Keep all follow-up visits as directed by your health care provider.  Take all medicines as directed by your health care provider. Contact a health care provider if:  Your symptoms get worse or you start having new symptoms.  You feel overwhelmed by your problems and can no longer manage them on your own. Get help right away if:  You feel like hurting yourself or someone else. This information is not intended to replace advice given to you by your health care provider. Make sure you discuss any questions you have with your health care provider. Document Released: 08/31/2000 Document Revised: 08/13/2015 Document Reviewed: 10/30/2012 Elsevier Interactive Patient Education  2017 Elsevier Inc.  

## 2018-02-12 NOTE — Progress Notes (Signed)
Subjective:    Patient ID: Clayton [name], male    DOB: 1961-03-02, 57 y.o.   MRN: [id number]   Chief Complaint: Medical Management of Chronic Issues   HPI:  1. Type 2 diabetes mellitus without complication, without long-term current use of insulin ([place]) last hgab1c was 7.0. He checks blood sugars most days and they are running below 150. No hypoglycemia.  2. Hyperlipidemia with target LDL less than 100  He doe snot really watch diet at all. He does not do any dedicated exercise. But he says that he stays very active.  3. Essential hypertension, benign  No c/o chest pain, sob or headache. Doe snot check blood pressure at home. BP Readings from Last 3 Encounters:  02/12/18 (!) 155/85  04/21/17 139/80  01/09/17 123/72     4. OSA (obstructive sleep apnea)  He wears CPAP nightly. Doe snot fell rested if he does not use.  5. Gastroesophageal reflux disease without esophagitis  Is on protoinix daily which he says works well to keep symptoms under control.  6. Recurrent major depressive disorder, in full remission ([place])  Still on lexapro daily. Works well to keep him form worrying so much. Depression screen [facility] 2/9 02/12/2018 04/21/2017 01/09/2017  Decreased Interest 0 0 0  Down, Depressed, Hopeless 0 0 0  PHQ - 2 Score 0 0 0     7. Morbid obesity ([place])  Weight is down 2lbs from previous visit    Outpatient Encounter Medications as of 02/12/2018  Medication Sig  . aspirin 81 MG EC tablet Take 1 tablet (81 mg total) by mouth daily. Swallow whole.  . escitalopram (LEXAPRO) 10 MG tablet TAKE ONE (1) TABLET EACH DAY  . glucose blood test strip Use to check BG 1 to 2 times daily.  One Touch Verio IQ test strips.  DX:  E11.9  . losartan-hydrochlorothiazide (HYZAAR) 100-25 MG tablet TAKE ONE (1) TABLET EACH DAY  . meloxicam (MOBIC) 15 MG tablet TAKE ONE (1) TABLET EACH DAY  . metFORMIN (GLUCOPHAGE) 1000 MG tablet TAKE ONE TABLET TWICE A DAY WITH FOOD  . ONETOUCH DELICA LANCETS 00P  MISC Use to check BG once or twice daily.  Dx:  E11.9  . pantoprazole (PROTONIX) 40 MG tablet TAKE ONE (1) TABLET EACH DAY  . sildenafil (REVATIO) 20 MG tablet 2-3 po prn       New complaints: None today  Social history: Works at [employer]  Constitutional: Negative for activity change and appetite change.  HENT: Negative.   Eyes: Negative for pain.  Respiratory: Negative for shortness of breath.   Cardiovascular: Negative for chest pain, palpitations and leg swelling.  Gastrointestinal: Negative for abdominal pain.  Endocrine: Negative for polydipsia.  Genitourinary: Negative.   Skin: Negative for rash.  Neurological: Negative for dizziness, weakness and headaches.  Hematological: Does not bruise/bleed easily.  Psychiatric/Behavioral: Negative.   All other systems reviewed and are negative.      Objective:   Physical Exam  Constitutional: He is oriented to person, place, and time. He appears well-developed and well-nourished.  HENT:  Head: Normocephalic.  Nose: Nose normal.  Mouth/Throat: Oropharynx is clear and moist.  Eyes: Pupils are equal, round, and reactive to light. EOM are normal.  Neck: Normal range of motion and phonation normal. Neck [name]. No JVD present. Carotid bruit is not present. No thyroid mass and no thyromegaly present.  Cardiovascular: Normal rate and regular rhythm.  Pulmonary/Chest: Effort normal and  breath sounds normal. No respiratory distress.  Abdominal: Soft. Normal appearance, normal aorta and bowel sounds are normal. There is no tenderness.  Musculoskeletal: Normal range of motion.  Lymphadenopathy:    He has no cervical adenopathy.  Neurological: He is alert and oriented to person, place, and time.  Skin: Skin is warm and dry.  Psychiatric: He has a normal mood and affect. His behavior is normal. Judgment and thought content normal.  Nursing note and vitals reviewed.   BP (!) 155/85   Pulse  69   Temp (!) 97.4 F (36.3 C) (Oral)   Ht _0  (1.778 m)   Wt 257 lb (116.6 kg)   BMI 36.88 kg/m   hgba1c 8.1    Assessment & Plan:  Clayton [name] comes in today with chief complaint of Medical Management of Chronic Issues   Diagnosis and orders addressed:  1. Type 2 diabetes mellitus without complication, without long-term current use of insulin (HCC) Continue to watch carbs in diet januvia 100 1 po daily #42 samples - Bayer DCA Hb A1c Waived - Microalbumin / creatinine urine ratio - metFORMIN (GLUCOPHAGE) 1000 MG tablet; TAKE ONE TABLET TWICE A DAY WITH FOOD  Dispense: 180 tablet; Refill: 1  2. Hyperlipidemia with target LDL less than 100 Low fat diet encouraged - Lipid panel  3. Essential hypertension, benign Low sodium diet - CMP14+EGFR - losartan-hydrochlorothiazide (HYZAAR) 100-25 MG tablet; TAKE ONE (1) TABLET EACH DAY  Dispense: 90 tablet; Refill: 1  4. OSA (obstructive sleep apnea) Continue to wear CPAP  5. Gastroesophageal reflux disease without esophagitis Avoid spicy foods Do not eat 2 hours prior to bedtime - pantoprazole (PROTONIX) 40 MG tablet; TAKE ONE (1) TABLET EACH DAY  Dispense: 90 tablet; Refill: 1  6. Recurrent major depressive disorder, in full remission ([place]) stress management - escitalopram (LEXAPRO) 10 MG tablet; TAKE ONE (1) TABLET EACH DAY  Dispense: 90 tablet; Refill: 1  7. Morbid obesity ([place]) Discussed diet and exercise for person with BMI >25 Will recheck weight in 3-6 months   Labs pending Health Maintenance reviewed Diet and exercise encouraged  Follow up plan: 3 months 6 weeks diabetes only  Mary-Margaret Hassell Done, FNP

## 2018-02-13 LAB — CMP14+EGFR
ALT: 19 IU/L (ref 0–44)
AST: 12 IU/L (ref 0–40)
Albumin/Globulin Ratio: 2 (ref 1.2–2.2)
Albumin: 4.3 g/dL (ref 3.5–5.5)
Alkaline Phosphatase: 82 IU/L (ref 39–117)
BUN/Creatinine Ratio: 18 (ref 9–20)
BUN: 13 mg/dL (ref 6–24)
Bilirubin Total: 0.5 mg/dL (ref 0.0–1.2)
CO2: 25 mmol/L (ref 20–29)
Calcium: 8.8 mg/dL (ref 8.7–10.2)
Chloride: 98 mmol/L (ref 96–106)
Creatinine, Ser: 0.73 mg/dL — ABNORMAL LOW (ref 0.76–1.27)
GFR calc Af Amer: 121 mL/min/{1.73_m2} (ref >59)
GFR calc non Af Amer: 104 mL/min/{1.73_m2} (ref >59)
Globulin, Total: 2.2 g/dL (ref 1.5–4.5)
Glucose: 166 mg/dL — ABNORMAL HIGH (ref 65–99)
Potassium: 3.7 mmol/L (ref 3.5–5.2)
Sodium: 139 mmol/L (ref 134–144)
Total Protein: 6.5 g/dL (ref 6.0–8.5)

## 2018-02-13 LAB — LIPID PANEL
Chol/HDL Ratio: 6.9 ratio — ABNORMAL HIGH (ref 0.0–5.0)
Cholesterol, Total: 179 mg/dL (ref 100–199)
HDL: 26 mg/dL — ABNORMAL LOW (ref >39)
LDL Calculated: 76 mg/dL (ref 0–99)
Triglycerides: 385 mg/dL — ABNORMAL HIGH (ref 0–149)
VLDL Cholesterol Cal: 77 mg/dL — ABNORMAL HIGH (ref 5–40)

## 2018-02-16 MED ORDER — FENOFIBRATE 145 MG PO TABS: 145.0000 mg | ORAL_TABLET | Freq: Every day | ORAL | 1 refills | Status: DC

## 2018-02-16 NOTE — Addendum Note (Signed)
Addended by: Chevis Pretty on: 02/16/2018 07:56 AM   Modules accepted: Orders

## 2018-03-26 ENCOUNTER — Encounter: Payer: Self-pay | Admitting: Nurse Practitioner

## 2018-03-26 ENCOUNTER — Ambulatory Visit: Payer: BLUE CROSS/BLUE SHIELD | Admitting: Nurse Practitioner

## 2018-03-26 VITALS — BP 135/78 | HR 74 | Temp 97.8°F | Ht 70.0 in | Wt 244.0 lb

## 2018-03-26 DIAGNOSIS — G4733 Obstructive sleep apnea (adult) (pediatric): Secondary | ICD-10-CM

## 2018-03-26 DIAGNOSIS — K219 Gastro-esophageal reflux disease without esophagitis: Secondary | ICD-10-CM | POA: Diagnosis not present

## 2018-03-26 DIAGNOSIS — E785 Hyperlipidemia, unspecified: Secondary | ICD-10-CM

## 2018-03-26 DIAGNOSIS — E119 Type 2 diabetes mellitus without complications: Secondary | ICD-10-CM | POA: Diagnosis not present

## 2018-03-26 DIAGNOSIS — F3342 Major depressive disorder, recurrent, in full remission: Secondary | ICD-10-CM

## 2018-03-26 DIAGNOSIS — Z86010 Personal history of colonic polyps: Secondary | ICD-10-CM

## 2018-03-26 DIAGNOSIS — I10 Essential (primary) hypertension: Secondary | ICD-10-CM | POA: Diagnosis not present

## 2018-03-26 DIAGNOSIS — N5201 Erectile dysfunction due to arterial insufficiency: Secondary | ICD-10-CM

## 2018-03-26 LAB — BAYER DCA HB A1C WAIVED: HB A1C (BAYER DCA - WAIVED): 7.9 % — ABNORMAL HIGH (ref <7.0)

## 2018-03-26 MED ORDER — SILDENAFIL CITRATE 20 MG PO TABS: ORAL_TABLET | ORAL | 2 refills | Status: DC

## 2018-03-26 MED ORDER — SITAGLIPTIN PHOSPHATE 100 MG PO TABS: 100.0000 mg | ORAL_TABLET | Freq: Every day | ORAL | 1 refills | Status: DC

## 2018-03-26 NOTE — Progress Notes (Signed)
Subjective:    Patient ID: SOHAN POTVIN, male    DOB: 1961-01-20, 58 y.o.   MRN: [id number]    Chief Complaint: medical management of chronic issues  HPI:  1. Essential hypertension, benign  No /o chest pain, sob or headache. Does not check blood pressure at home.    BP Readings from Last 3 Encounters:  02/12/18 (!) 155/85  04/21/17 139/80  01/09/17 123/72     2. OSA (obstructive sleep apnea)  Wears cpap nightly. Feels rested if he doe snot knock it off during his sleep.  3. Gastroesophageal reflux disease without esophagitis  Takes protonix daily. Works well to keep symptoms under control  4. Type 2 diabetes mellitus without complication, without long-term current use of insulin ([place]) last hgab1c was 8.2. we added januvia to metformin. fasting blood sugars are running less than 140 on most days. He has really been watching his diet  5. Morbid obesity ([place])  Weight is down 13lbs since last visit  6. Hyperlipidemia with target LDL less than 100  Does not watch diet and does very little exercise  7. Recurrent major depressive disorder, in full remission ([place])  Takes lexapro daily  8. Hx of adenomatous colonic polyps  needs to repeat colonoscopy in 3 years  25.     Erectile dysfunction         In cialis 20 mg that only gives him a headache but does not help otherwise.     Outpatient Encounter Medications as of 03/26/2018  Medication Sig  . aspirin 81 MG EC tablet Take 1 tablet (81 mg total) by mouth daily. Swallow whole.  . escitalopram (LEXAPRO) 10 MG tablet TAKE ONE (1) TABLET EACH DAY  . fenofibrate (TRICOR) 145 MG tablet Take 1 tablet (145 mg total) by mouth daily.  Marland Kitchen glucose blood test strip Use to check BG 1 to 2 times daily.  One Touch Verio IQ test strips.  DX:  E11.9  . losartan-hydrochlorothiazide (HYZAAR) 100-25 MG tablet TAKE ONE (1) TABLET EACH DAY  . meloxicam (MOBIC) 15 MG tablet TAKE ONE (1) TABLET EACH DAY  . metFORMIN (GLUCOPHAGE) 1000 MG tablet  TAKE ONE TABLET TWICE A DAY WITH FOOD  . ONETOUCH DELICA LANCETS 24O MISC Use to check BG once or twice daily.  Dx:  E11.9  . pantoprazole (PROTONIX) 40 MG tablet TAKE ONE (1) TABLET EACH DAY  . sildenafil (REVATIO) 20 MG tablet 2-3 po prn  . sitaGLIPtin (JANUVIA) 100 MG tablet Take 1 tablet (100 mg total) by mouth daily.     New complaints: None today  Social history: still working as a Manufacturing engineer     Review of Systems  Constitutional: Negative for activity change and appetite change.  HENT: Negative.   Eyes: Negative for pain.  Respiratory: Negative for shortness of breath.   Cardiovascular: Negative for chest pain, palpitations and leg swelling.  Gastrointestinal: Negative for abdominal pain.  Endocrine: Negative for polydipsia.  Genitourinary: Negative.   Skin: Negative for rash.  Neurological: Negative for dizziness, weakness and headaches.  Hematological: Does not bruise/bleed easily.  Psychiatric/Behavioral: Negative.   All other systems reviewed and are negative.      Objective:   Physical Exam Vitals signs and nursing note reviewed.  Constitutional:      Appearance: Normal appearance. He is well-developed.  HENT:     Head: Normocephalic.     Nose: Nose normal.  Eyes:     Pupils: Pupils are equal, round, and reactive to  light.  Neck:     Musculoskeletal: Normal range of motion and neck supple.     Thyroid: No thyroid mass or thyromegaly.     Vascular: No carotid bruit or JVD.     Trachea: Phonation normal.  Cardiovascular:     Rate and Rhythm: Normal rate and regular rhythm.  Pulmonary:     Effort: Pulmonary effort is normal. No respiratory distress.     Breath sounds: Normal breath sounds.  Abdominal:     General: Bowel sounds are normal.     Palpations: Abdomen is soft.     Tenderness: There is no abdominal tenderness.  Musculoskeletal: Normal range of motion.  Lymphadenopathy:     Cervical: No cervical adenopathy.  Skin:     General: Skin is warm and dry.  Neurological:     Mental Status: He is alert and oriented to person, place, and time.  Psychiatric:        Behavior: Behavior normal.        Thought Content: Thought content normal.        Judgment: Judgment normal.    BP 135/78   Pulse 74   Temp 97.8 F (36.6 C) (Oral)   Ht 5\' 10"  (1.778 m)   Wt 244 lb (110.7 kg)   BMI 35.01 kg/m   hgba1c 7.9       Assessment & Plan:  LATREL SZYMCZAK comes in today with chief complaint of Recheck HgB A1C   Diagnosis and orders addressed:  1. Essential hypertension, benign Low sodium diet  2. OSA (obstructive sleep apnea) Continue to wear cpap  3. Gastroesophageal reflux disease without esophagitis Avoid spicy foods Do not eat 2 hours prior to bedtime   4. Type 2 diabetes mellitus without complication, without long-term current use of insulin (HCC) Continue to decrease carbs in diet - Bayer DCA Hb A1c Waived - sitaGLIPtin (JANUVIA) 100 MG tablet; Take 1 tablet (100 mg total) by mouth daily.  Dispense: 90 tablet; Refill: 1  5. Morbid obesity ([place]) Discussed diet and exercise for person with BMI >25 Will recheck weight in 3-6 months  6. Hyperlipidemia with target LDL less than 100 Low fat diet  7. Recurrent major depressive disorder, in full remission ([place]) Stress management  8. Hx of adenomatous colonic polyps  9. Erectile dysfunction due to arterial insufficiency -sildenafil 20mg  2 po prn- #30 2 refills   Labs pending Health Maintenance reviewed Diet and exercise encouraged  Follow up plan: 3 months   [place], FNP

## 2018-03-26 NOTE — Patient Instructions (Signed)
Diabetes Mellitus and Nutrition, Adult  When you have diabetes (diabetes mellitus), it is very important to have healthy eating habits because your blood sugar (glucose) levels are greatly affected by what you eat and drink. Eating healthy foods in the appropriate amounts, at about the same times every day, can help you:  · Control your blood glucose.  · Lower your risk of heart disease.  · Improve your blood pressure.  · Reach or maintain a healthy weight.  Every person with diabetes is different, and each person has different needs for a meal plan. Your health care provider may recommend that you work with a diet and nutrition specialist (dietitian) to make a meal plan that is best for you. Your meal plan may vary depending on factors such as:  · The calories you need.  · The medicines you take.  · Your weight.  · Your blood glucose, blood pressure, and cholesterol levels.  · Your activity level.  · Other health conditions you have, such as heart or kidney disease.  How do carbohydrates affect me?  Carbohydrates, also called carbs, affect your blood glucose level more than any other type of food. Eating carbs naturally raises the amount of glucose in your blood. Carb counting is a method for keeping track of how many carbs you eat. Counting carbs is important to keep your blood glucose at a healthy level, especially if you use insulin or take certain oral diabetes medicines.  It is important to know how many carbs you can safely have in each meal. This is different for every person. Your dietitian can help you calculate how many carbs you should have at each meal and for each snack.  Foods that contain carbs include:  · Bread, cereal, rice, pasta, and crackers.  · Potatoes and corn.  · Peas, beans, and lentils.  · Milk and yogurt.  · Fruit and juice.  · Desserts, such as cakes, cookies, ice cream, and candy.  How does alcohol affect me?  Alcohol can cause a sudden decrease in blood glucose (hypoglycemia),  especially if you use insulin or take certain oral diabetes medicines. Hypoglycemia can be a life-threatening condition. Symptoms of hypoglycemia (sleepiness, dizziness, and confusion) are similar to symptoms of having too much alcohol.  If your health care provider says that alcohol is safe for you, follow these guidelines:  · Limit alcohol intake to no more than 1 drink per day for nonpregnant women and 2 drinks per day for men. One drink equals 12 oz of beer, 5 oz of wine, or 1½ oz of hard liquor.  · Do not drink on an empty stomach.  · Keep yourself hydrated with water, diet soda, or unsweetened iced tea.  · Keep in mind that regular soda, juice, and other mixers may contain a lot of sugar and must be counted as carbs.  What are tips for following this plan?    Reading food labels  · Start by checking the serving size on the "Nutrition Facts" label of packaged foods and drinks. The amount of calories, carbs, fats, and other nutrients listed on the label is based on one serving of the item. Many items contain more than one serving per package.  · Check the total grams (g) of carbs in one serving. You can calculate the number of servings of carbs in one serving by dividing the total carbs by 15. For example, if a food has 30 g of total carbs, it would be equal to 2   servings of carbs.  · Check the number of grams (g) of saturated and trans fats in one serving. Choose foods that have low or no amount of these fats.  · Check the number of milligrams (mg) of salt (sodium) in one serving. Most people should limit total sodium intake to less than 2,300 mg per day.  · Always check the nutrition information of foods labeled as "low-fat" or "nonfat". These foods may be higher in added sugar or refined carbs and should be avoided.  · Talk to your dietitian to identify your daily goals for nutrients listed on the label.  Shopping  · Avoid buying canned, premade, or processed foods. These foods tend to be high in fat, sodium,  and added sugar.  · Shop around the outside edge of the grocery store. This includes fresh fruits and vegetables, bulk grains, fresh meats, and fresh dairy.  Cooking  · Use low-heat cooking methods, such as baking, instead of high-heat cooking methods like deep frying.  · Cook using healthy oils, such as olive, canola, or sunflower oil.  · Avoid cooking with butter, cream, or high-fat meats.  Meal planning  · Eat meals and snacks regularly, preferably at the same times every day. Avoid going long periods of time without eating.  · Eat foods high in fiber, such as fresh fruits, vegetables, beans, and whole grains. Talk to your dietitian about how many servings of carbs you can eat at each meal.  · Eat 4-6 ounces (oz) of lean protein each day, such as lean meat, chicken, fish, eggs, or tofu. One oz of lean protein is equal to:  ? 1 oz of meat, chicken, or fish.  ? 1 egg.  ? ¼ cup of tofu.  · Eat some foods each day that contain healthy fats, such as avocado, nuts, seeds, and fish.  Lifestyle  · Check your blood glucose regularly.  · Exercise regularly as told by your health care provider. This may include:  ? 150 minutes of moderate-intensity or vigorous-intensity exercise each week. This could be brisk walking, biking, or water aerobics.  ? Stretching and doing strength exercises, such as yoga or weightlifting, at least 2 times a week.  · Take medicines as told by your health care provider.  · Do not use any products that contain nicotine or tobacco, such as cigarettes and e-cigarettes. If you need help quitting, ask your health care provider.  · Work with a counselor or diabetes educator to identify strategies to manage stress and any emotional and social challenges.  Questions to ask a health care provider  · Do I need to meet with a diabetes educator?  · Do I need to meet with a dietitian?  · What number can I call if I have questions?  · When are the best times to check my blood glucose?  Where to find more  information:  · American Diabetes Association: diabetes.org  · Academy of Nutrition and Dietetics: www.eatright.org  · National Institute of Diabetes and Digestive and Kidney Diseases (NIH): www.niddk.nih.gov  Summary  · A healthy meal plan will help you control your blood glucose and maintain a healthy lifestyle.  · Working with a diet and nutrition specialist (dietitian) can help you make a meal plan that is best for you.  · Keep in mind that carbohydrates (carbs) and alcohol have immediate effects on your blood glucose levels. It is important to count carbs and to use alcohol carefully.  This information is not intended to   replace advice given to you by your health care provider. Make sure you discuss any questions you have with your health care provider.  Document Released: 12/02/2004 Document Revised: 10/05/2016 Document Reviewed: 04/11/2016  Elsevier Interactive Patient Education © 2019 Elsevier Inc.

## 2018-05-18 ENCOUNTER — Other Ambulatory Visit: Payer: Self-pay | Admitting: *Deleted

## 2018-05-18 DIAGNOSIS — E119 Type 2 diabetes mellitus without complications: Secondary | ICD-10-CM

## 2018-05-18 MED ORDER — GLUCOSE BLOOD VI STRP: ORAL_STRIP | 3 refills | Status: DC

## 2018-05-18 MED ORDER — ONETOUCH DELICA LANCETS 33G MISC: 3 refills | Status: DC

## 2018-06-25 ENCOUNTER — Other Ambulatory Visit: Payer: Self-pay

## 2018-06-25 ENCOUNTER — Encounter: Payer: Self-pay | Admitting: Nurse Practitioner

## 2018-06-25 ENCOUNTER — Ambulatory Visit: Payer: BLUE CROSS/BLUE SHIELD | Admitting: Nurse Practitioner

## 2018-06-25 VITALS — BP 152/85 | HR 108 | Temp 98.4°F | Ht 70.0 in | Wt 245.0 lb

## 2018-06-25 DIAGNOSIS — G4733 Obstructive sleep apnea (adult) (pediatric): Secondary | ICD-10-CM

## 2018-06-25 DIAGNOSIS — F3342 Major depressive disorder, recurrent, in full remission: Secondary | ICD-10-CM

## 2018-06-25 DIAGNOSIS — R350 Frequency of micturition: Secondary | ICD-10-CM

## 2018-06-25 DIAGNOSIS — K219 Gastro-esophageal reflux disease without esophagitis: Secondary | ICD-10-CM

## 2018-06-25 DIAGNOSIS — I10 Essential (primary) hypertension: Secondary | ICD-10-CM | POA: Diagnosis not present

## 2018-06-25 DIAGNOSIS — E785 Hyperlipidemia, unspecified: Secondary | ICD-10-CM | POA: Diagnosis not present

## 2018-06-25 DIAGNOSIS — E119 Type 2 diabetes mellitus without complications: Secondary | ICD-10-CM

## 2018-06-25 LAB — URINALYSIS, COMPLETE
Bilirubin, UA: NEGATIVE
Ketones, UA: NEGATIVE
Leukocytes,UA: NEGATIVE
Nitrite, UA: NEGATIVE
Protein,UA: NEGATIVE
Specific Gravity, UA: 1.02 (ref 1.005–1.030)
Urobilinogen, Ur: 0.2 mg/dL (ref 0.2–1.0)
pH, UA: 7 (ref 5.0–7.5)

## 2018-06-25 LAB — MICROSCOPIC EXAMINATION
Bacteria, UA: NONE SEEN
Epithelial Cells (non renal): >10 /hpf — AB (ref 0–10)
Renal Epithel, UA: NONE SEEN /hpf

## 2018-06-25 LAB — BAYER DCA HB A1C WAIVED: HB A1C (BAYER DCA - WAIVED): 8.2 % — ABNORMAL HIGH (ref <7.0)

## 2018-06-25 MED ORDER — SITAGLIPTIN PHOSPHATE 100 MG PO TABS: 100.0000 mg | ORAL_TABLET | Freq: Every day | ORAL | 1 refills | Status: DC

## 2018-06-25 MED ORDER — ESCITALOPRAM OXALATE 10 MG PO TABS: ORAL_TABLET | ORAL | 1 refills | Status: DC

## 2018-06-25 MED ORDER — METFORMIN HCL 1000 MG PO TABS: ORAL_TABLET | ORAL | 1 refills | Status: DC

## 2018-06-25 MED ORDER — PANTOPRAZOLE SODIUM 40 MG PO TBEC: DELAYED_RELEASE_TABLET | ORAL | 1 refills | Status: DC

## 2018-06-25 MED ORDER — LOSARTAN POTASSIUM-HCTZ 100-25 MG PO TABS: ORAL_TABLET | ORAL | 1 refills | Status: DC

## 2018-06-25 MED ORDER — DOXYCYCLINE HYCLATE 100 MG PO TABS: 100.0000 mg | ORAL_TABLET | Freq: Two times a day (BID) | ORAL | 0 refills | Status: DC

## 2018-06-25 MED ORDER — FENOFIBRATE 145 MG PO TABS: 145.0000 mg | ORAL_TABLET | Freq: Every day | ORAL | 1 refills | Status: DC

## 2018-06-25 NOTE — Patient Instructions (Signed)
Carbohydrate Counting for Diabetes Mellitus, Adult  Carbohydrate counting is a method of keeping track of how many carbohydrates you eat. Eating carbohydrates naturally increases the amount of sugar (glucose) in the blood. Counting how many carbohydrates you eat helps keep your blood glucose within normal limits, which helps you manage your diabetes (diabetes mellitus). It is important to know how many carbohydrates you can safely have in each meal. This is different for every person. A diet and nutrition specialist (registered dietitian) can help you make a meal plan and calculate how many carbohydrates you should have at each meal and snack. Carbohydrates are found in the following foods:  Grains, such as breads and cereals.  Dried beans and soy products.  Starchy vegetables, such as potatoes, peas, and corn.  Fruit and fruit juices.  Milk and yogurt.  Sweets and snack foods, such as cake, cookies, candy, chips, and soft drinks. How do I count carbohydrates? There are two ways to count carbohydrates in food. You can use either of the methods or a combination of both. Reading "Nutrition Facts" on packaged food The "Nutrition Facts" list is included on the labels of almost all packaged foods and beverages in the [place] It includes:  The serving size.  Information about nutrients in each serving, including the grams (g) of carbohydrate per serving. To use the "Nutrition Facts":  Decide how many servings you will have.  Multiply the number of servings by the number of carbohydrates per serving.  The resulting number is the total amount of carbohydrates that you will be having. Learning standard serving sizes of other foods When you eat carbohydrate foods that are not packaged or do not include "Nutrition Facts" on the label, you need to measure the servings in order to count the amount of carbohydrates:  Measure the foods that you will eat with a food scale or measuring cup, if needed.   Decide how many standard-size servings you will eat.  Multiply the number of servings by 15. Most carbohydrate-rich foods have about 15 g of carbohydrates per serving. ? For example, if you eat 8 oz (170 g) of strawberries, you will have eaten 2 servings and 30 g of carbohydrates (2 servings x 15 g = 30 g).  For foods that have more than one food mixed, such as soups and casseroles, you must count the carbohydrates in each food that is included. The following list contains standard serving sizes of common carbohydrate-rich foods. Each of these servings has about 15 g of carbohydrates:   hamburger bun or  English muffin.   oz (15 mL) syrup.   oz (14 g) jelly.  1 slice of bread.  1 six-inch tortilla.  3 oz (85 g) cooked rice or pasta.  4 oz (113 g) cooked dried beans.  4 oz (113 g) starchy vegetable, such as peas, corn, or potatoes.  4 oz (113 g) hot cereal.  4 oz (113 g) mashed potatoes or  of a large baked potato.  4 oz (113 g) canned or frozen fruit.  4 oz (120 mL) fruit juice.  4-6 crackers.  6 chicken nuggets.  6 oz (170 g) unsweetened dry cereal.  6 oz (170 g) plain fat-free yogurt or yogurt sweetened with artificial sweeteners.  8 oz (240 mL) milk.  8 oz (170 g) fresh fruit or one small piece of fruit.  24 oz (680 g) popped popcorn. Example of carbohydrate counting Sample meal  3 oz (85 g) chicken breast.  6 oz (170 g)   brown rice.  4 oz (113 g) corn.  8 oz (240 mL) milk.  8 oz (170 g) strawberries with sugar-free whipped topping. Carbohydrate calculation 1. Identify the foods that contain carbohydrates: ? Rice. ? Corn. ? Milk. ? Strawberries. 2. Calculate how many servings you have of each food: ? 2 servings rice. ? 1 serving corn. ? 1 serving milk. ? 1 serving strawberries. 3. Multiply each number of servings by 15 g: ? 2 servings rice x 15 g = 30 g. ? 1 serving corn x 15 g = 15 g. ? 1 serving milk x 15 g = 15 g. ? 1 serving  strawberries x 15 g = 15 g. 4. Add together all of the amounts to find the total grams of carbohydrates eaten: ? 30 g + 15 g + 15 g + 15 g = 75 g of carbohydrates total. Summary  Carbohydrate counting is a method of keeping track of how many carbohydrates you eat.  Eating carbohydrates naturally increases the amount of sugar (glucose) in the blood.  Counting how many carbohydrates you eat helps keep your blood glucose within normal limits, which helps you manage your diabetes.  A diet and nutrition specialist (registered dietitian) can help you make a meal plan and calculate how many carbohydrates you should have at each meal and snack. This information is not intended to replace advice given to you by your health care provider. Make sure you discuss any questions you have with your health care provider. Document Released: 03/07/2005 Document Revised: 09/14/2016 Document Reviewed: 08/19/2015 Elsevier Interactive Patient Education  2019 Elsevier Inc.  

## 2018-06-25 NOTE — Progress Notes (Addendum)
Subjective:    Patient ID: Clayton [name], male    DOB: 04/07/1960, 58 y.o.   MRN: [id number]   Chief Complaint: Medical Management of Chronic Issues (Can't control urine)   HPI:  1. Essential hypertension, benign  No c/o chest pain,sob or headaches. Does  check blood pressure at home. Systolic around 557"D  2. Type 2 diabetes mellitus without complication, without long-term current use of insulin ([place]) last hgba1c was 7.9% . His fasting blood sugars have been running 120-130. No hypoglycema  3. Hyperlipidemia with target LDL less than 100  Doe snot watch diet and does no exercise  4. Frequent urination  Has had incontinence for several days with slight burning  5. OSA (obstructive sleep apnea) Wears CPAP nightly- no problems   6. Gastroesophageal reflux disease without esophagitis  Takes protonix daily with no issues  7. Recurrent major depressive disorder, in full remission ([place])  Is on lexapro and says it works well for him. Denies any side effects.  8. Morbid obesity ([place])  No weight change    Outpatient Encounter Medications as of 06/25/2018  Medication Sig  . aspirin 81 MG EC tablet Take 1 tablet (81 mg total) by mouth daily. Swallow whole.  . escitalopram (LEXAPRO) 10 MG tablet TAKE ONE (1) TABLET EACH DAY  . fenofibrate (TRICOR) 145 MG tablet Take 1 tablet (145 mg total) by mouth daily.  Marland Kitchen glucose blood (ONETOUCH VERIO) test strip Use to check BG 1 to 2 times daily Dx: E11.9  . losartan-hydrochlorothiazide (HYZAAR) 100-25 MG tablet TAKE ONE (1) TABLET EACH DAY  . metFORMIN (GLUCOPHAGE) 1000 MG tablet TAKE ONE TABLET TWICE A DAY WITH FOOD  . ONETOUCH DELICA LANCETS 22G MISC Use to check BG 1 to 2 times daily.  Dx:  E11.9  . pantoprazole (PROTONIX) 40 MG tablet TAKE ONE (1) TABLET EACH DAY  . sildenafil (REVATIO) 20 MG tablet 2 po prn as needed  . sitaGLIPtin (JANUVIA) 100 MG tablet Take 1 tablet (100 mg total) by mouth daily.       New complaints: None today   Social history: Lives with wife.   Review of Systems  Constitutional: Negative for activity change and appetite change.  HENT: Negative.   Eyes: Negative for pain.  Respiratory: Negative for shortness of breath.   Cardiovascular: Negative for chest pain, palpitations and leg swelling.  Gastrointestinal: Negative for abdominal pain.  Endocrine: Negative for polydipsia.  Genitourinary: Negative.   Skin: Negative for rash.  Neurological: Negative for dizziness, weakness and headaches.  Hematological: Does not bruise/bleed easily.  Psychiatric/Behavioral: Negative.   All other systems reviewed and are negative.      Objective:   Physical Exam Vitals signs and nursing note reviewed.  Constitutional:      Appearance: Normal appearance. He is well-developed.  HENT:     Head: Normocephalic.     Nose: Nose normal.  Eyes:     Pupils: Pupils are equal, round, and reactive to light.  Neck:     Musculoskeletal: Normal range of motion and neck supple.     Thyroid: No thyroid mass or thyromegaly.     Vascular: No carotid bruit or JVD.     Trachea: Phonation normal.  Cardiovascular:     Rate and Rhythm: Normal rate and regular rhythm.     Heart sounds: Murmur (2/6 systolic) present.  Pulmonary:     Effort: Pulmonary effort is normal. No respiratory distress.     Breath sounds: Normal breath sounds.  Abdominal:     General: Bowel sounds are normal.     Palpations: Abdomen is soft.     Tenderness: There is no abdominal tenderness.  Musculoskeletal: Normal range of motion.  Lymphadenopathy:     Cervical: No cervical adenopathy.  Skin:    General: Skin is warm and dry.  Neurological:     Mental Status: He is alert and oriented to person, place, and time.  Psychiatric:        Behavior: Behavior normal.        Thought Content: Thought content normal.        Judgment: Judgment normal.     BP (!) 152/85   Pulse (!) 108   Temp 98.4 F (36.9 C) (Oral)   Ht _0  (1.778 m)    Wt 245 lb (111.1 kg)   BMI 35.15 kg/m   HGBA1c 8.6  UA- WBC 6-10      Assessment & Plan:  Clayton [name] comes in today with chief complaint of Medical Management of Chronic Issues (Can't control urine)   Diagnosis and orders addressed:  1. Essential hypertension, benign Low sodium diet - CMP14+EGFR - losartan-hydrochlorothiazide (HYZAAR) 100-25 MG tablet; TAKE ONE (1) TABLET EACH DAY  Dispense: 90 tablet; Refill: 1  2. Type 2 diabetes mellitus without complication, without long-term current use of insulin (HCC) Stricter carb counting Wants to try diet and exercise before added new meds - Bayer DCA Hb A1c Waived - Microalbumin / creatinine urine ratio - sitaGLIPtin (JANUVIA) 100 MG tablet; Take 1 tablet (100 mg total) by mouth daily.  Dispense: 90 tablet; Refill: 1 - metFORMIN (GLUCOPHAGE) 1000 MG tablet; TAKE ONE TABLET TWICE A DAY WITH FOOD  Dispense: 180 tablet; Refill: 1  3. Hyperlipidemia with target LDL less than 100 Low fat diet - Lipid panel - fenofibrate (TRICOR) 145 MG tablet; Take 1 tablet (145 mg total) by mouth daily.  Dispense: 90 tablet; Refill: 1  4. Frequent urination - Urinalysis, Complete - doxycycline (VIBRA-TABS) 100 MG tablet; Take 1 tablet (100 mg total) by mouth 2 (two) times daily. 1 po bid  Dispense: 20 tablet; Refill: 0  5. OSA (obstructive sleep apnea) Continue to wear CPAP nightly  6. Gastroesophageal reflux disease without esophagitis Avoid spicy foods Do not eat 2 hours prior to bedtime - pantoprazole (PROTONIX) 40 MG tablet; TAKE ONE (1) TABLET EACH DAY  Dispense: 90 tablet; Refill: 1  7. Recurrent major depressive disorder, in full remission ([place]) Stress management - escitalopram (LEXAPRO) 10 MG tablet; TAKE ONE (1) TABLET EACH DAY  Dispense: 90 tablet; Refill: 1  8. Morbid obesity ([place]) Discussed diet and exercise for person with BMI >25 Will recheck weight in 3-6 months    Labs pending Health Maintenance reviewed Diet and  exercise encouraged  Follow up plan: 3 months   Mary-Margaret Hassell Done, FNP

## 2018-06-26 LAB — CMP14+EGFR
ALT: 35 IU/L (ref 0–44)
AST: 24 IU/L (ref 0–40)
Albumin/Globulin Ratio: 1.4 (ref 1.2–2.2)
Albumin: 4.1 g/dL (ref 3.8–4.9)
Alkaline Phosphatase: 148 IU/L — ABNORMAL HIGH (ref 39–117)
BUN/Creatinine Ratio: 19 (ref 9–20)
BUN: 17 mg/dL (ref 6–24)
Bilirubin Total: 0.6 mg/dL (ref 0.0–1.2)
CO2: 25 mmol/L (ref 20–29)
Calcium: 9.2 mg/dL (ref 8.7–10.2)
Chloride: 95 mmol/L — ABNORMAL LOW (ref 96–106)
Creatinine, Ser: 0.91 mg/dL (ref 0.76–1.27)
GFR calc Af Amer: 109 mL/min/{1.73_m2} (ref >59)
GFR calc non Af Amer: 94 mL/min/{1.73_m2} (ref >59)
Globulin, Total: 3 g/dL (ref 1.5–4.5)
Glucose: 232 mg/dL — ABNORMAL HIGH (ref 65–99)
Potassium: 4 mmol/L (ref 3.5–5.2)
Sodium: 139 mmol/L (ref 134–144)
Total Protein: 7.1 g/dL (ref 6.0–8.5)

## 2018-06-26 LAB — LIPID PANEL
Chol/HDL Ratio: 7.8 ratio — ABNORMAL HIGH (ref 0.0–5.0)
Cholesterol, Total: 148 mg/dL (ref 100–199)
HDL: 19 mg/dL — ABNORMAL LOW (ref >39)
LDL Calculated: 78 mg/dL (ref 0–99)
Triglycerides: 257 mg/dL — ABNORMAL HIGH (ref 0–149)
VLDL Cholesterol Cal: 51 mg/dL — ABNORMAL HIGH (ref 5–40)

## 2018-06-26 LAB — MICROALBUMIN / CREATININE URINE RATIO
Creatinine, Urine: 54.2 mg/dL
Microalb/Creat Ratio: 47 mg/g creat — ABNORMAL HIGH (ref 0–29)
Microalbumin, Urine: 25.5 ug/mL

## 2018-08-14 ENCOUNTER — Emergency Department (HOSPITAL_BASED_OUTPATIENT_CLINIC_OR_DEPARTMENT_OTHER)
Admission: EM | Admit: 2018-08-14 | Discharge: 2018-08-14 | Disposition: A | Discharge status: Home / Self Care | Payer: Worker's Compensation | Attending: Emergency Medicine | Admitting: Emergency Medicine

## 2018-08-14 ENCOUNTER — Encounter (HOSPITAL_BASED_OUTPATIENT_CLINIC_OR_DEPARTMENT_OTHER): Payer: Self-pay

## 2018-08-14 ENCOUNTER — Other Ambulatory Visit: Payer: Self-pay

## 2018-08-14 DIAGNOSIS — S61451A Open bite of right hand, initial encounter: Secondary | ICD-10-CM | POA: Diagnosis not present

## 2018-08-14 DIAGNOSIS — W5503XA Scratched by cat, initial encounter: Secondary | ICD-10-CM | POA: Insufficient documentation

## 2018-08-14 DIAGNOSIS — S60512A Abrasion of left hand, initial encounter: Secondary | ICD-10-CM | POA: Diagnosis not present

## 2018-08-14 DIAGNOSIS — E119 Type 2 diabetes mellitus without complications: Secondary | ICD-10-CM | POA: Diagnosis not present

## 2018-08-14 DIAGNOSIS — S6981XA Other specified injuries of right wrist, hand and finger(s), initial encounter: Secondary | ICD-10-CM | POA: Diagnosis present

## 2018-08-14 DIAGNOSIS — F17228 Nicotine dependence, chewing tobacco, with other nicotine-induced disorders: Secondary | ICD-10-CM | POA: Insufficient documentation

## 2018-08-14 DIAGNOSIS — W5501XA Bitten by cat, initial encounter: Secondary | ICD-10-CM | POA: Diagnosis not present

## 2018-08-14 DIAGNOSIS — Y9289 Other specified places as the place of occurrence of the external cause: Secondary | ICD-10-CM | POA: Diagnosis not present

## 2018-08-14 DIAGNOSIS — I10 Essential (primary) hypertension: Secondary | ICD-10-CM | POA: Insufficient documentation

## 2018-08-14 DIAGNOSIS — Z23 Encounter for immunization: Secondary | ICD-10-CM | POA: Insufficient documentation

## 2018-08-14 DIAGNOSIS — F17210 Nicotine dependence, cigarettes, uncomplicated: Secondary | ICD-10-CM | POA: Diagnosis not present

## 2018-08-14 DIAGNOSIS — Y998 Other external cause status: Secondary | ICD-10-CM | POA: Diagnosis not present

## 2018-08-14 DIAGNOSIS — Y9389 Activity, other specified: Secondary | ICD-10-CM | POA: Insufficient documentation

## 2018-08-14 DIAGNOSIS — Z7984 Long term (current) use of oral hypoglycemic drugs: Secondary | ICD-10-CM | POA: Diagnosis not present

## 2018-08-14 DIAGNOSIS — Z203 Contact with and (suspected) exposure to rabies: Secondary | ICD-10-CM | POA: Insufficient documentation

## 2018-08-14 MED ORDER — RABIES VACCINE, PCEC IM SUSR
1.0000 mL | Freq: Once | INTRAMUSCULAR | Status: AC
  Administered 2018-08-14: 1 mL via INTRAMUSCULAR
  Filled 2018-08-14: qty 1

## 2018-08-14 MED ORDER — AMOXICILLIN-POT CLAVULANATE 875-125 MG PO TABS: 1.0000 | ORAL_TABLET | Freq: Two times a day (BID) | ORAL | 0 refills | Status: AC

## 2018-08-14 MED ORDER — RABIES IMMUNE GLOBULIN 150 UNIT/ML IM INJ
20.0000 [IU]/kg | INJECTION | Freq: Once | INTRAMUSCULAR | Status: AC
  Administered 2018-08-14: 2250 [IU] via INTRAMUSCULAR
  Filled 2018-08-14: qty 16

## 2018-08-14 NOTE — ED Triage Notes (Addendum)
Pt was bit by stray cat at work 5/23-right ring finger-was sent for rabies series-NAD-steady gait

## 2018-08-14 NOTE — Discharge Instructions (Addendum)
Take Augmentin as prescribed.  Take entire course, even if your symptoms improve. Wash the bite/scratch twice a day with soapy water. Continue to use antibiotic ointment to prevent infection. Follow-up for the remainder of rabies vaccines. You may do this at the urgent care listed below.  Use tylenol as needed for pain.  Return to the ER if you develop high fevers, severe worsening pain/swelling, pus draining from the area, or with any new, worsening, or concerning symptoms.

## 2018-08-14 NOTE — ED Provider Notes (Signed)
[facility] Provider Note   CSN: [id number] Arrival date & time: 08/14/18  1625    History   Chief Complaint Chief Complaint  Patient presents with  . Animal Bite    HPI Clayton [name] is a 58 y.o. male presenting for evaluation of cat bite.  Patient states 3 days ago he was bitten on the right middle finger and scratched on his left index finger by a stray cat.  He reports cleaning the area with soap and water, and has been applying antibiotic ointment.  He reports no pain of the left index finger, but reports increasing pain and swelling at the site of the bite on his right middle finger.  Patient's also at work encouraged him to be seen.  He was seen at his primary care, who updated his tetanus and sent him to the emergency room for rabies vaccines.  Denies fevers.  Denies presenting from the area.  Denies numbness or tingling.  Denies injury elsewhere.  He has a history of diabetes, his blood sugars have been normal.  He states that he is on an antibiotic for UTI, but he does not know the name.  He has been on this for 3 weeks so far.      HPI  Past Medical History:  Diagnosis Date  . Allergy   . Anxiety   . Depression   . Diabetes mellitus without complication ([place])   . GERD (gastroesophageal reflux disease)   . High cholesterol   . Hx of adenomatous colonic polyps 02/16/2015  . Hypertension   . Sleep apnea    wears B-PAP  . Testosterone deficiency     Patient Active Problem List   Diagnosis Date Noted  . Hx of adenomatous colonic polyps 02/16/2015  . Morbid obesity ([place]) 08/08/2014  . Type 2 diabetes mellitus ([place]) 05/09/2014  . OSA (obstructive sleep apnea) 03/01/2013  . Essential hypertension, benign 06/14/2012  . Hyperlipidemia with target LDL less than 100 06/14/2012  . GERD (gastroesophageal reflux disease) 06/14/2012  . Depression 06/14/2012    Past Surgical History:  Procedure Laterality Date  . ANTERIOR CRUCIATE  LIGAMENT REPAIR          Home Medications    Prior to Admission medications   Medication Sig Start Date End Date Taking? Authorizing Provider  amoxicillin-clavulanate (AUGMENTIN) 875-125 MG tablet Take 1 tablet by mouth every 12 (twelve) hours for 10 days. 08/14/18 08/24/18  Allena Pietila, PA-C  aspirin 81 MG EC tablet Take 1 tablet (81 mg total) by mouth daily. Swallow whole. 08/08/14   Cherre Robins, PharmD  doxycycline (VIBRA-TABS) 100 MG tablet Take 1 tablet (100 mg total) by mouth 2 (two) times daily. 1 po bid 06/25/18   Chevis Pretty, FNP  escitalopram (LEXAPRO) 10 MG tablet TAKE ONE (1) TABLET EACH DAY 06/25/18   Hassell Done, Mary-Margaret, FNP  fenofibrate (TRICOR) 145 MG tablet Take 1 tablet (145 mg total) by mouth daily. 06/25/18   Hassell Done Mary-Margaret, FNP  glucose blood (ONETOUCH VERIO) test strip Use to check BG 1 to 2 times daily Dx: E11.9 05/18/18   Chevis Pretty, FNP  losartan-hydrochlorothiazide (HYZAAR) 100-25 MG tablet TAKE ONE (1) TABLET EACH DAY 06/25/18   Chevis Pretty, FNP  metFORMIN (GLUCOPHAGE) 1000 MG tablet TAKE ONE TABLET TWICE A DAY WITH FOOD 06/25/18   Hassell Done, Mary-Margaret, FNP  [facility] DELICA LANCETS 79G MISC Use to check BG 1 to 2 times daily.  Dx:  E11.9 05/18/18   Chevis Pretty, FNP  pantoprazole (PROTONIX) 40 MG tablet TAKE ONE (1) TABLET EACH DAY 06/25/18   Hassell Done, Mary-Margaret, FNP  sildenafil (REVATIO) 20 MG tablet 2 po prn as needed 03/26/18   Hassell Done, Mary-Margaret, FNP  sitaGLIPtin (JANUVIA) 100 MG tablet Take 1 tablet (100 mg total) by mouth daily. 06/25/18   Chevis Pretty, FNP    Family History Family History  Problem Relation Age of Onset  . Hypertension Father   . Diabetes Father   . Cancer Mother   . Diabetes Paternal Grandfather   . Colon cancer Neg Hx   . Esophageal cancer Neg Hx   . Rectal cancer Neg Hx   . Stomach cancer Neg Hx   . Prostate cancer Neg Hx   . Pancreatic cancer Neg Hx     Social History Social  History   Tobacco Use  . Smoking status: Current Every Day Smoker    Packs/day: 0.50    Years: 35.00    Pack years: 17.50    Types: Cigarettes  . Smokeless tobacco: Current User    Types: Snuff  . Tobacco comment: I dip here and there  Substance Use Topics  . Alcohol use: Yes    Alcohol/week: 0.0 standard drinks    Comment: occ  . Drug use: No     Allergies   Patient has no known allergies.   Review of Systems Review of Systems  Constitutional: Negative for fever.  Skin: Positive for wound.     Physical Exam Updated Vital Signs BP (!) 141/68 (BP Location: Left Arm)   Pulse 70   Temp 98.1 F (36.7 C) (Oral)   Resp 20   Ht 5\' 10"  (1.778 m)   Wt 111.1 kg   SpO2 97%   BMI 35.15 kg/m   Physical Exam Vitals signs and nursing note reviewed.  Constitutional:      General: He is not in acute distress.    Appearance: He is well-developed.  HENT:     Head: Normocephalic and atraumatic.  Neck:     Musculoskeletal: Normal range of motion.  Pulmonary:     Effort: Pulmonary effort is normal.  Abdominal:     General: There is no distension.  Musculoskeletal: Normal range of motion.  Skin:    General: Skin is warm.     Capillary Refill: Capillary refill takes less than 2 seconds.     Findings: No rash.     Comments: Small, 2 mm, puncture wound on the distal radial aspect of the right middle finger.  Surrounding erythema and swelling.  Full active range of motion of the finger.  Sensation intact.  Good cap refill.  No streaking. Strength against resistance intact.  Small, 1 mm cut of the L index finger along the radial aspect at the PIP. No erythema, warmth, or swelling. No tenderness.   Neurological:     Mental Status: He is alert and oriented to person, place, and time.      ED Treatments / Results  Labs (all labs ordered are listed, but only abnormal results are displayed) Labs Reviewed - No data to display  EKG None  Radiology No results found.   Procedures Procedures (including critical care time)  Medications Ordered in ED Medications  rabies immune globulin (HYPERAB/KEDRAB) injection 2,250 Units (has no administration in time range)  rabies vaccine (RABAVERT) injection 1 mL (has no administration in time range)     Initial Impression / Assessment and Plan / ED Course  I have reviewed the triage vital signs  and the nursing notes.  Pertinent labs & imaging results that were available during my care of the patient were reviewed by me and considered in my medical decision making (see chart for details).        Pt presenting for evaluation of cat bite/scratch.  Physical exam reassuring, he is neurovascularly intact.  At the site of the bite, patient has erythema, swelling, and tenderness.  As this has been increasing in the past 4 days, and pt his diabetic, concern for wound infection.  Patient is already on antibiotic for UTI, will start him on Augmentin as I doubt patient needs UTI will treat the cat bite.  Tetanus was updated today by his PCP.  Rabies series started, patient given information for follow-up for remaining rabies injections.  Discussed wound care at home.  Discussed importance of monitoring for signs of infection.  At this time, patient appears safe for discharge.  Return precautions given. Patient states he understands agrees plan.   Final Clinical Impressions(s) / ED Diagnoses   Final diagnoses:  Cat bite of right hand, initial encounter  Cat scratch of left hand, initial encounter    ED Discharge Orders         Ordered    amoxicillin-clavulanate (AUGMENTIN) 875-125 MG tablet  Every 12 hours     08/14/18 [place], Resean Brander, PA-C 08/14/18 1658    Davonna Belling, MD 08/14/18 2228

## 2018-08-17 ENCOUNTER — Emergency Department (INDEPENDENT_AMBULATORY_CARE_PROVIDER_SITE_OTHER)
Admission: EM | Admit: 2018-08-17 | Discharge: 2018-08-17 | Disposition: A | Discharge status: Home / Self Care | Payer: Worker's Compensation | Source: Home / Self Care

## 2018-08-17 ENCOUNTER — Encounter: Payer: Self-pay | Admitting: *Deleted

## 2018-08-17 ENCOUNTER — Other Ambulatory Visit: Payer: Self-pay

## 2018-08-17 DIAGNOSIS — Z23 Encounter for immunization: Secondary | ICD-10-CM | POA: Diagnosis not present

## 2018-08-17 MED ORDER — RABIES VACCINE, PCEC IM SUSR
1.0000 mL | Freq: Once | INTRAMUSCULAR | Status: AC
  Administered 2018-08-17: 1 mL via INTRAMUSCULAR

## 2018-08-17 NOTE — ED Triage Notes (Signed)
Pt is here today for his day 3 rabies vaccine.

## 2018-08-21 ENCOUNTER — Emergency Department
Admission: EM | Admit: 2018-08-21 | Discharge: 2018-09-03 | Discharge status: Absent without leave | Payer: Worker's Compensation

## 2019-01-07 ENCOUNTER — Other Ambulatory Visit: Payer: Self-pay | Admitting: *Deleted

## 2019-01-07 DIAGNOSIS — E785 Hyperlipidemia, unspecified: Secondary | ICD-10-CM

## 2019-01-07 DIAGNOSIS — F3342 Major depressive disorder, recurrent, in full remission: Secondary | ICD-10-CM

## 2019-01-07 DIAGNOSIS — I10 Essential (primary) hypertension: Secondary | ICD-10-CM

## 2019-01-07 DIAGNOSIS — E119 Type 2 diabetes mellitus without complications: Secondary | ICD-10-CM

## 2019-01-07 DIAGNOSIS — K219 Gastro-esophageal reflux disease without esophagitis: Secondary | ICD-10-CM

## 2019-01-07 MED ORDER — ESCITALOPRAM OXALATE 10 MG PO TABS: ORAL_TABLET | ORAL | 0 refills | Status: DC

## 2019-01-07 MED ORDER — METFORMIN HCL 1000 MG PO TABS: ORAL_TABLET | ORAL | 0 refills | Status: DC

## 2019-01-07 MED ORDER — SITAGLIPTIN PHOSPHATE 100 MG PO TABS: 100.0000 mg | ORAL_TABLET | Freq: Every day | ORAL | 0 refills | Status: DC

## 2019-01-07 MED ORDER — PANTOPRAZOLE SODIUM 40 MG PO TBEC: DELAYED_RELEASE_TABLET | ORAL | 0 refills | Status: DC

## 2019-01-07 MED ORDER — LOSARTAN POTASSIUM-HCTZ 100-25 MG PO TABS: ORAL_TABLET | ORAL | 0 refills | Status: DC

## 2019-01-07 MED ORDER — FENOFIBRATE 145 MG PO TABS: 145.0000 mg | ORAL_TABLET | Freq: Every day | ORAL | 0 refills | Status: DC

## 2019-01-07 NOTE — Telephone Encounter (Signed)
Pt aware 30 day refill sent, ntbs for 6 mos appt

## 2019-01-09 ENCOUNTER — Other Ambulatory Visit: Payer: Self-pay | Admitting: Nurse Practitioner

## 2019-01-09 DIAGNOSIS — I10 Essential (primary) hypertension: Secondary | ICD-10-CM

## 2019-01-18 ENCOUNTER — Other Ambulatory Visit: Payer: Self-pay

## 2019-01-21 ENCOUNTER — Ambulatory Visit: Payer: BC Managed Care – PPO | Admitting: Nurse Practitioner

## 2019-01-21 ENCOUNTER — Encounter: Payer: Self-pay | Admitting: Nurse Practitioner

## 2019-01-21 ENCOUNTER — Other Ambulatory Visit: Payer: Self-pay | Admitting: Nurse Practitioner

## 2019-01-21 ENCOUNTER — Other Ambulatory Visit: Payer: Self-pay

## 2019-01-21 VITALS — BP 141/82 | HR 67 | Temp 98.0°F | Resp 20 | Ht 70.0 in | Wt 247.0 lb

## 2019-01-21 DIAGNOSIS — E119 Type 2 diabetes mellitus without complications: Secondary | ICD-10-CM

## 2019-01-21 DIAGNOSIS — E1169 Type 2 diabetes mellitus with other specified complication: Secondary | ICD-10-CM

## 2019-01-21 DIAGNOSIS — I10 Essential (primary) hypertension: Secondary | ICD-10-CM

## 2019-01-21 DIAGNOSIS — N5201 Erectile dysfunction due to arterial insufficiency: Secondary | ICD-10-CM

## 2019-01-21 DIAGNOSIS — K219 Gastro-esophageal reflux disease without esophagitis: Secondary | ICD-10-CM | POA: Diagnosis not present

## 2019-01-21 DIAGNOSIS — G4733 Obstructive sleep apnea (adult) (pediatric): Secondary | ICD-10-CM

## 2019-01-21 DIAGNOSIS — F3342 Major depressive disorder, recurrent, in full remission: Secondary | ICD-10-CM

## 2019-01-21 DIAGNOSIS — E785 Hyperlipidemia, unspecified: Secondary | ICD-10-CM

## 2019-01-21 LAB — BAYER DCA HB A1C WAIVED: HB A1C (BAYER DCA - WAIVED): 8.2 % — ABNORMAL HIGH (ref <7.0)

## 2019-01-21 MED ORDER — SILDENAFIL CITRATE 20 MG PO TABS: ORAL_TABLET | ORAL | 2 refills | Status: DC

## 2019-01-21 MED ORDER — ESCITALOPRAM OXALATE 10 MG PO TABS: ORAL_TABLET | ORAL | 0 refills | Status: DC

## 2019-01-21 MED ORDER — GLIMEPIRIDE 2 MG PO TABS: 2.0000 mg | ORAL_TABLET | Freq: Every day | ORAL | 1 refills | Status: DC

## 2019-01-21 MED ORDER — LOSARTAN POTASSIUM-HCTZ 100-25 MG PO TABS: ORAL_TABLET | ORAL | 0 refills | Status: DC

## 2019-01-21 MED ORDER — FENOFIBRATE 145 MG PO TABS: 145.0000 mg | ORAL_TABLET | Freq: Every day | ORAL | 0 refills | Status: DC

## 2019-01-21 MED ORDER — PANTOPRAZOLE SODIUM 40 MG PO TBEC: DELAYED_RELEASE_TABLET | ORAL | 0 refills | Status: DC

## 2019-01-21 MED ORDER — METFORMIN HCL 1000 MG PO TABS: ORAL_TABLET | ORAL | 0 refills | Status: DC

## 2019-01-21 NOTE — Progress Notes (Signed)
Subjective:    Patient ID: Clayton [name], male    DOB: [birth year], 58 y.o.   MRN: [id number]   Chief Complaint: m4edical management of chronic issues   HPI:  1. Essential hypertension, benign No c/o chest pain , sob or headache. Does not check blood pressure at home. BP Readings from Last 3 Encounters:  08/17/18 138/83  08/14/18 (!) 141/68  06/25/18 (!) 152/85      2. Hyperlipidemia associated with type 2 diabetes mellitus ([place]) Does not wtach diet and does no exercise. LDL are good but Trig ar elevated Lab Results  Component Value Date   CHOL 148 06/25/2018   HDL 19 (L) 06/25/2018   LDLCALC 78 06/25/2018   LDLDIRECT 70 09/24/2014   TRIG 257 (H) 06/25/2018   CHOLHDL 7.8 (H) 06/25/2018    3. Type 2 diabetes mellitus without complication, without long-term current use of insulin ([place]) Checks blood sugars some but not everyday like he should. Denies any low blood sugars or feelings that blood sugar is low. He refused medicaton changes at last visit and wanted to try diet and exercsie. Lab Results  Component Value Date   HGBA1C 8.2 (H) 06/25/2018     4. Gastroesophageal reflux disease without esophagitis He takes protonix daily and says works well to keep symptoms under control.  5. OSA (obstructive sleep apnea) Wears CPAP nightly. Feels rested in mornings  6. Recurrent major depressive disorder, in full remission ([place]) Is on lexapro and is doing well Depression screen [facility] 2/9 01/21/2019 06/25/2018 03/26/2018  Decreased Interest 0 0 0  Down, Depressed, Hopeless 0 0 0  PHQ - 2 Score 0 0 0     7. Morbid obesity ([place]) No recent weight changes Wt Readings from Last 3 Encounters:  08/14/18 245 lb (111.1 kg)  06/25/18 245 lb (111.1 kg)  03/26/18 244 lb (110.7 kg)   BMI Readings from Last 3 Encounters:  08/14/18 35.15 kg/m  06/25/18 35.15 kg/m  03/26/18 35.01 kg/m       Outpatient Encounter Medications as of 01/21/2019  Medication Sig  . aspirin 81 MG  EC tablet Take 1 tablet (81 mg total) by mouth daily. Swallow whole.  Marland Kitchen doxycycline (VIBRA-TABS) 100 MG tablet Take 1 tablet (100 mg total) by mouth 2 (two) times daily. 1 po bid  . escitalopram (LEXAPRO) 10 MG tablet TAKE ONE (1) TABLET EACH DAY  . fenofibrate (TRICOR) 145 MG tablet Take 1 tablet (145 mg total) by mouth daily.  Marland Kitchen glucose blood (ONETOUCH VERIO) test strip Use to check BG 1 to 2 times daily Dx: E11.9  . losartan-hydrochlorothiazide (HYZAAR) 100-25 MG tablet TAKE 1 TABLET BY MOUTH ONCE DAILY  . metFORMIN (GLUCOPHAGE) 1000 MG tablet TAKE ONE TABLET TWICE A DAY WITH FOOD  . ONETOUCH DELICA LANCETS 89H MISC Use to check BG 1 to 2 times daily.  Dx:  E11.9  . pantoprazole (PROTONIX) 40 MG tablet TAKE ONE (1) TABLET EACH DAY  . sildenafil (REVATIO) 20 MG tablet 2 po prn as needed  . sitaGLIPtin (JANUVIA) 100 MG tablet Take 1 tablet (100 mg total) by mouth daily.    Past Surgical History:  Procedure Laterality Date  . ANTERIOR CRUCIATE LIGAMENT REPAIR      Family History  Problem Relation Age of Onset  . Hypertension Father   . Diabetes Father   . Cancer Mother   . Diabetes Paternal Grandfather   . Colon cancer Neg Hx   . Esophageal cancer Neg Hx   .  Rectal cancer Neg Hx   . Stomach cancer Neg Hx   . Prostate cancer Neg Hx   . Pancreatic cancer Neg Hx     New complaints: None today  Social history: Engineer, building services  Controlled substance contract: n/a    Review of Systems  Constitutional: Negative for activity change and appetite change.  HENT: Negative.   Eyes: Negative for pain.  Respiratory: Negative for shortness of breath.   Cardiovascular: Negative for chest pain, palpitations and leg swelling.  Gastrointestinal: Negative for abdominal pain.  Endocrine: Negative for polydipsia.  Genitourinary: Negative.   Musculoskeletal: Positive for arthralgias.  Skin: Negative for rash.  Neurological: Negative for dizziness, weakness and headaches.  Hematological:  Does not bruise/bleed easily.  Psychiatric/Behavioral: Negative.   All other systems reviewed and are negative.      Objective:   Physical Exam Vitals signs and nursing note reviewed.  Constitutional:      Appearance: Normal appearance. He is well-developed.  HENT:     Head: Normocephalic.     Nose: Nose normal.  Eyes:     Pupils: Pupils are equal, round, and reactive to light.  Neck:     Musculoskeletal: Normal range of motion and neck supple.     Thyroid: No thyroid mass or thyromegaly.     Vascular: No carotid bruit or JVD.     Trachea: Phonation normal.  Cardiovascular:     Rate and Rhythm: Normal rate and regular rhythm.  Pulmonary:     Effort: Pulmonary effort is normal. No respiratory distress.     Breath sounds: Normal breath sounds.  Abdominal:     General: Bowel sounds are normal.     Palpations: Abdomen is soft.     Tenderness: There is no abdominal tenderness.  Musculoskeletal: Normal range of motion.  Lymphadenopathy:     Cervical: No cervical adenopathy.  Skin:    General: Skin is warm and dry.  Neurological:     Mental Status: He is alert and oriented to person, place, and time.  Psychiatric:        Behavior: Behavior normal.        Thought Content: Thought content normal.        Judgment: Judgment normal.     BP (!) 141/82   Pulse 67   Temp 98 F (36.7 C) (Temporal)   Resp 20   Ht _0  (1.778 m)   Wt 247 lb (112 kg)   SpO2 99%   BMI 35.44 kg/m   hgba1c 8.2%      Assessment & Plan:  Clayton [name] comes in today with chief complaint of Medical Management of Chronic Issues   Diagnosis and orders addressed:  1. Essential hypertension, benign Low sodium diet - CMP14+EGFR - losartan-hydrochlorothiazide (HYZAAR) 100-25 MG tablet; TAKE 1 TABLET BY MOUTH ONCE DAILY  Dispense: 90 tablet; Refill: 0  2. Hyperlipidemia associated with type 2 diabetes mellitus (HCC) Low fat diet - Lipid panel - fenofibrate (TRICOR) 145 MG tablet; Take 1  tablet (145 mg total) by mouth daily.  Dispense: 30 tablet; Refill: 0   3. Type 2 diabetes mellitus without complication, without long-term current use of insulin (HCC) Stricter carb counting Added amaryl- hypoglycemeia discussed Insurance will not pay for [place] or farxiga - Bayer DCA Hb A1c Waived - metFORMIN (GLUCOPHAGE) 1000 MG tablet; TAKE ONE TABLET TWICE A DAY WITH FOOD  Dispense: 60 tablet; Refill: 0 - glimepiride (AMARYL) 2 MG tablet; Take 1 tablet (2 mg total) by mouth  daily before breakfast.  Dispense: 90 tablet; Refill: 1  4. Gastroesophageal reflux disease without esophagitis Avoid spicy foods Do not eat 2 hours prior to bedtime - pantoprazole (PROTONIX) 40 MG tablet; TAKE ONE (1) TABLET EACH DAY  Dispense: 30 tablet; Refill: 0  5. OSA (obstructive sleep apnea) Continue to wear CPAP  6. Recurrent major depressive disorder, in full remission ([place]) stress management - escitalopram (LEXAPRO) 10 MG tablet; TAKE ONE (1) TABLET EACH DAY  Dispense: 30 tablet; Refill: 0  7. Morbid obesity ([place]) Discussed diet and exercise for person with BMI >25 Will recheck weight in 3-6 months  8. Erectile dysfunction due to arterial insufficiency - sildenafil (REVATIO) 20 MG tablet; 2 po prn as needed  Dispense: 30 tablet; Refill: 2   Labs pending Health Maintenance reviewed Diet and exercise encouraged  Follow up plan: 3 months   Mary-Margaret Hassell Done, FNP

## 2019-01-21 NOTE — Patient Instructions (Signed)
Carbohydrate Counting for Diabetes Mellitus, Adult  Carbohydrate counting is a method of keeping track of how many carbohydrates you eat. Eating carbohydrates naturally increases the amount of sugar (glucose) in the blood. Counting how many carbohydrates you eat helps keep your blood glucose within normal limits, which helps you manage your diabetes (diabetes mellitus). It is important to know how many carbohydrates you can safely have in each meal. This is different for every person. A diet and nutrition specialist (registered dietitian) can help you make a meal plan and calculate how many carbohydrates you should have at each meal and snack. Carbohydrates are found in the following foods:  Grains, such as breads and cereals.  Dried beans and soy products.  Starchy vegetables, such as potatoes, peas, and corn.  Fruit and fruit juices.  Milk and yogurt.  Sweets and snack foods, such as cake, cookies, candy, chips, and soft drinks. How do I count carbohydrates? There are two ways to count carbohydrates in food. You can use either of the methods or a combination of both. Reading "Nutrition Facts" on packaged food The "Nutrition Facts" list is included on the labels of almost all packaged foods and beverages in the [place] It includes:  The serving size.  Information about nutrients in each serving, including the grams (g) of carbohydrate per serving. To use the "Nutrition Facts":  Decide how many servings you will have.  Multiply the number of servings by the number of carbohydrates per serving.  The resulting number is the total amount of carbohydrates that you will be having. Learning standard serving sizes of other foods When you eat carbohydrate foods that are not packaged or do not include "Nutrition Facts" on the label, you need to measure the servings in order to count the amount of carbohydrates:  Measure the foods that you will eat with a food scale or measuring cup, if needed.   Decide how many standard-size servings you will eat.  Multiply the number of servings by 15. Most carbohydrate-rich foods have about 15 g of carbohydrates per serving. ? For example, if you eat 8 oz (170 g) of strawberries, you will have eaten 2 servings and 30 g of carbohydrates (2 servings x 15 g = 30 g).  For foods that have more than one food mixed, such as soups and casseroles, you must count the carbohydrates in each food that is included. The following list contains standard serving sizes of common carbohydrate-rich foods. Each of these servings has about 15 g of carbohydrates:   hamburger bun or  English muffin.   oz (15 mL) syrup.   oz (14 g) jelly.  1 slice of bread.  1 six-inch tortilla.  3 oz (85 g) cooked rice or pasta.  4 oz (113 g) cooked dried beans.  4 oz (113 g) starchy vegetable, such as peas, corn, or potatoes.  4 oz (113 g) hot cereal.  4 oz (113 g) mashed potatoes or  of a large baked potato.  4 oz (113 g) canned or frozen fruit.  4 oz (120 mL) fruit juice.  4-6 crackers.  6 chicken nuggets.  6 oz (170 g) unsweetened dry cereal.  6 oz (170 g) plain fat-free yogurt or yogurt sweetened with artificial sweeteners.  8 oz (240 mL) milk.  8 oz (170 g) fresh fruit or one small piece of fruit.  24 oz (680 g) popped popcorn. Example of carbohydrate counting Sample meal  3 oz (85 g) chicken breast.  6 oz (170 g)   brown rice.  4 oz (113 g) corn.  8 oz (240 mL) milk.  8 oz (170 g) strawberries with sugar-free whipped topping. Carbohydrate calculation 1. Identify the foods that contain carbohydrates: ? Rice. ? Corn. ? Milk. ? Strawberries. 2. Calculate how many servings you have of each food: ? 2 servings rice. ? 1 serving corn. ? 1 serving milk. ? 1 serving strawberries. 3. Multiply each number of servings by 15 g: ? 2 servings rice x 15 g = 30 g. ? 1 serving corn x 15 g = 15 g. ? 1 serving milk x 15 g = 15 g. ? 1 serving  strawberries x 15 g = 15 g. 4. Add together all of the amounts to find the total grams of carbohydrates eaten: ? 30 g + 15 g + 15 g + 15 g = 75 g of carbohydrates total. Summary  Carbohydrate counting is a method of keeping track of how many carbohydrates you eat.  Eating carbohydrates naturally increases the amount of sugar (glucose) in the blood.  Counting how many carbohydrates you eat helps keep your blood glucose within normal limits, which helps you manage your diabetes.  A diet and nutrition specialist (registered dietitian) can help you make a meal plan and calculate how many carbohydrates you should have at each meal and snack. This information is not intended to replace advice given to you by your health care provider. Make sure you discuss any questions you have with your health care provider. Document Released: 03/07/2005 Document Revised: 09/29/2016 Document Reviewed: 08/19/2015 Elsevier Patient Education  2020 [employer]  

## 2019-01-22 LAB — LIPID PANEL
Chol/HDL Ratio: 6.2 ratio — ABNORMAL HIGH (ref 0.0–5.0)
Cholesterol, Total: 180 mg/dL (ref 100–199)
HDL: 29 mg/dL — ABNORMAL LOW (ref >39)
LDL Chol Calc (NIH): 108 mg/dL — ABNORMAL HIGH (ref 0–99)
Triglycerides: 250 mg/dL — ABNORMAL HIGH (ref 0–149)
VLDL Cholesterol Cal: 43 mg/dL — ABNORMAL HIGH (ref 5–40)

## 2019-01-22 LAB — CMP14+EGFR
ALT: 19 IU/L (ref 0–44)
AST: 11 IU/L (ref 0–40)
Albumin/Globulin Ratio: 1.8 (ref 1.2–2.2)
Albumin: 4.5 g/dL (ref 3.8–4.9)
Alkaline Phosphatase: 64 IU/L (ref 39–117)
BUN/Creatinine Ratio: 23 — ABNORMAL HIGH (ref 9–20)
BUN: 20 mg/dL (ref 6–24)
Bilirubin Total: 0.7 mg/dL (ref 0.0–1.2)
CO2: 25 mmol/L (ref 20–29)
Calcium: 9.1 mg/dL (ref 8.7–10.2)
Chloride: 100 mmol/L (ref 96–106)
Creatinine, Ser: 0.86 mg/dL (ref 0.76–1.27)
GFR calc Af Amer: 112 mL/min/{1.73_m2} (ref >59)
GFR calc non Af Amer: 97 mL/min/{1.73_m2} (ref >59)
Globulin, Total: 2.5 g/dL (ref 1.5–4.5)
Glucose: 146 mg/dL — ABNORMAL HIGH (ref 65–99)
Potassium: 3.9 mmol/L (ref 3.5–5.2)
Sodium: 137 mmol/L (ref 134–144)
Total Protein: 7 g/dL (ref 6.0–8.5)

## 2019-01-31 ENCOUNTER — Other Ambulatory Visit: Payer: Self-pay | Admitting: Nurse Practitioner

## 2019-01-31 DIAGNOSIS — E119 Type 2 diabetes mellitus without complications: Secondary | ICD-10-CM

## 2019-01-31 DIAGNOSIS — E1169 Type 2 diabetes mellitus with other specified complication: Secondary | ICD-10-CM

## 2019-01-31 DIAGNOSIS — K219 Gastro-esophageal reflux disease without esophagitis: Secondary | ICD-10-CM

## 2019-02-13 ENCOUNTER — Other Ambulatory Visit: Payer: Self-pay | Admitting: Nurse Practitioner

## 2019-02-13 DIAGNOSIS — F3342 Major depressive disorder, recurrent, in full remission: Secondary | ICD-10-CM

## 2019-04-26 ENCOUNTER — Ambulatory Visit: Payer: Self-pay | Admitting: Nurse Practitioner

## 2019-04-26 ENCOUNTER — Other Ambulatory Visit: Payer: Self-pay

## 2019-04-29 ENCOUNTER — Ambulatory Visit: Payer: BC Managed Care – PPO | Admitting: Nurse Practitioner

## 2019-04-29 ENCOUNTER — Encounter: Payer: Self-pay | Admitting: Nurse Practitioner

## 2019-04-29 ENCOUNTER — Other Ambulatory Visit: Payer: Self-pay

## 2019-04-29 VITALS — BP 140/76 | HR 68 | Temp 98.7°F | Resp 20 | Ht 70.0 in | Wt 253.0 lb

## 2019-04-29 DIAGNOSIS — K219 Gastro-esophageal reflux disease without esophagitis: Secondary | ICD-10-CM

## 2019-04-29 DIAGNOSIS — E785 Hyperlipidemia, unspecified: Secondary | ICD-10-CM

## 2019-04-29 DIAGNOSIS — F3342 Major depressive disorder, recurrent, in full remission: Secondary | ICD-10-CM | POA: Diagnosis not present

## 2019-04-29 DIAGNOSIS — E1169 Type 2 diabetes mellitus with other specified complication: Secondary | ICD-10-CM

## 2019-04-29 DIAGNOSIS — E119 Type 2 diabetes mellitus without complications: Secondary | ICD-10-CM | POA: Diagnosis not present

## 2019-04-29 DIAGNOSIS — Z125 Encounter for screening for malignant neoplasm of prostate: Secondary | ICD-10-CM

## 2019-04-29 DIAGNOSIS — I10 Essential (primary) hypertension: Secondary | ICD-10-CM

## 2019-04-29 DIAGNOSIS — G4733 Obstructive sleep apnea (adult) (pediatric): Secondary | ICD-10-CM

## 2019-04-29 LAB — BAYER DCA HB A1C WAIVED: HB A1C (BAYER DCA - WAIVED): 7.5 % — ABNORMAL HIGH (ref <7.0)

## 2019-04-29 MED ORDER — METFORMIN HCL 1000 MG PO TABS: ORAL_TABLET | ORAL | 1 refills | Status: DC

## 2019-04-29 MED ORDER — PANTOPRAZOLE SODIUM 40 MG PO TBEC: 40.0000 mg | DELAYED_RELEASE_TABLET | Freq: Every day | ORAL | 1 refills | Status: DC

## 2019-04-29 MED ORDER — ESCITALOPRAM OXALATE 10 MG PO TABS: ORAL_TABLET | ORAL | 1 refills | Status: DC

## 2019-04-29 MED ORDER — FENOFIBRATE 145 MG PO TABS: 145.0000 mg | ORAL_TABLET | Freq: Every day | ORAL | 1 refills | Status: DC

## 2019-04-29 MED ORDER — LOSARTAN POTASSIUM-HCTZ 100-25 MG PO TABS: ORAL_TABLET | ORAL | 1 refills | Status: DC

## 2019-04-29 MED ORDER — GLIMEPIRIDE 2 MG PO TABS: 2.0000 mg | ORAL_TABLET | Freq: Every day | ORAL | 1 refills | Status: DC

## 2019-04-29 NOTE — Patient Instructions (Signed)
Diabetes Mellitus and Foot Care Foot care is an important part of your health, especially when you have diabetes. Diabetes may cause you to have problems because of poor blood flow (circulation) to your feet and legs, which can cause your skin to:  Become thinner and drier.  Break more easily.  Heal more slowly.  Peel and crack. You may also have nerve damage (neuropathy) in your legs and feet, causing decreased feeling in them. This means that you may not notice minor injuries to your feet that could lead to more serious problems. Noticing and addressing any potential problems early is the best way to prevent future foot problems. How to care for your feet Foot hygiene  Wash your feet daily with warm water and mild soap. Do not use hot water. Then, pat your feet and the areas between your toes until they are completely dry. Do not soak your feet as this can dry your skin.  Trim your toenails straight across. Do not dig under them or around the cuticle. File the edges of your nails with an emery board or nail file.  Apply a moisturizing lotion or petroleum jelly to the skin on your feet and to dry, brittle toenails. Use lotion that does not contain alcohol and is unscented. Do not apply lotion between your toes. Shoes and socks  Wear clean socks or stockings every day. Make sure they are not too tight. Do not wear knee-high stockings since they may decrease blood flow to your legs.  Wear shoes that fit properly and have enough cushioning. Always look in your shoes before you put them on to be sure there are no objects inside.  To break in new shoes, wear them for just a few hours a day. This prevents injuries on your feet. Wounds, scrapes, corns, and calluses  Check your feet daily for blisters, cuts, bruises, sores, and redness. If you cannot see the bottom of your feet, use a mirror or ask someone for help.  Do not cut corns or calluses or try to remove them with medicine.  If you  find a minor scrape, cut, or break in the skin on your feet, keep it and the skin around it clean and dry. You may clean these areas with mild soap and water. Do not clean the area with peroxide, alcohol, or iodine.  If you have a wound, scrape, corn, or callus on your foot, look at it several times a day to make sure it is healing and not infected. Check for: ? Redness, swelling, or pain. ? Fluid or blood. ? Warmth. ? Pus or a bad smell. General instructions  Do not cross your legs. This may decrease blood flow to your feet.  Do not use heating pads or hot water bottles on your feet. They may burn your skin. If you have lost feeling in your feet or legs, you may not know this is happening until it is too late.  Protect your feet from hot and cold by wearing shoes, such as at the beach or on hot pavement.  Schedule a complete foot exam at least once a year (annually) or more often if you have foot problems. If you have foot problems, report any cuts, sores, or bruises to your health care provider immediately. Contact a health care provider if:  You have a medical condition that increases your risk of infection and you have any cuts, sores, or bruises on your feet.  You have an injury that is not   healing.  You have redness on your legs or feet.  You feel burning or tingling in your legs or feet.  You have pain or cramps in your legs and feet.  Your legs or feet are numb.  Your feet always feel cold.  You have pain around a toenail. Get help right away if:  You have a wound, scrape, corn, or callus on your foot and: ? You have pain, swelling, or redness that gets worse. ? You have fluid or blood coming from the wound, scrape, corn, or callus. ? Your wound, scrape, corn, or callus feels warm to the touch. ? You have pus or a bad smell coming from the wound, scrape, corn, or callus. ? You have a fever. ? You have a red line going up your leg. Summary  Check your feet every day  for cuts, sores, red spots, swelling, and blisters.  Moisturize feet and legs daily.  Wear shoes that fit properly and have enough cushioning.  If you have foot problems, report any cuts, sores, or bruises to your health care provider immediately.  Schedule a complete foot exam at least once a year (annually) or more often if you have foot problems. This information is not intended to replace advice given to you by your health care provider. Make sure you discuss any questions you have with your health care provider. Document Revised: 11/28/2018 Document Reviewed: 04/08/2016 Elsevier Patient Education  2020 Elsevier Inc.  

## 2019-04-29 NOTE — Progress Notes (Signed)
Subjective:    Patient ID: Clayton [name], male    DOB: Dec 02, 1960, 59 y.o.   MRN: [id number]   Chief Complaint: Medical Management of Chronic issues   HPI:  1. Essential hypertension, benign No c/o chest pain, sob or headache. Does not check blood pressure at home. BP Readings from Last 3 Encounters:  01/21/19 (!) 141/82  08/17/18 138/83  08/14/18 (!) 141/68     2. Hyperlipidemia associated with type 2 diabetes mellitus ([place]) Does not really diet and does very little exercise. Lab Results  Component Value Date   CHOL 180 01/21/2019   HDL 29 (L) 01/21/2019   LDLCALC 108 (H) 01/21/2019   LDLDIRECT 70 09/24/2014   TRIG 250 (H) 01/21/2019   CHOLHDL 6.2 (H) 01/21/2019     3. Type 2 diabetes mellitus without complication, without long-term current use of insulin (HCC) Blood sugars running around 116 fasting. He does not check it every day. Denies any symptoms of low blood sugars. We added amaryl at last visit. Lab Results  Component Value Date   HGBA1C 8.2 (H) 01/21/2019     4. Recurrent major depressive disorder, in full remission ([place]) Takes lexapro daily and says that he is dong well. Depression screen [facility] 2/9 04/29/2019 01/21/2019 06/25/2018 03/26/2018 02/12/2018  Decreased Interest 0 0 0 0 0  Down, Depressed, Hopeless 0 0 0 0 0  PHQ - 2 Score 0 0 0 0 0     5. OSA (obstructive sleep apnea) Wears cpap nightly- says he sleeps well and feels rested in the mornings.  6. Gastroesophageal reflux disease without esophagitis Is on protonix daily and that seems to work well for symptoms  7. Morbid obesity ([place]) Weight is up several lbs since last visit. Wt Readings from Last 3 Encounters:  04/29/19 253 lb (114.8 kg)  01/21/19 247 lb (112 kg)  08/14/18 245 lb (111.1 kg)   BMI Readings from Last 3 Encounters:  04/29/19 36.30 kg/m  01/21/19 35.44 kg/m  08/14/18 35.15 kg/m       Outpatient Encounter Medications as of 04/29/2019  Medication Sig  . aspirin 81 MG  EC tablet Take 1 tablet (81 mg total) by mouth daily. Swallow whole.  . escitalopram (LEXAPRO) 10 MG tablet TAKE 1 TABLET BY MOUTH EVERY DAY  . fenofibrate (TRICOR) 145 MG tablet TAKE 1 TABLET BY MOUTH EVERY DAY  . glimepiride (AMARYL) 2 MG tablet Take 1 tablet (2 mg total) by mouth daily before breakfast.  . glucose blood (ONETOUCH VERIO) test strip Use to check BG 1 to 2 times daily Dx: E11.9  . losartan-hydrochlorothiazide (HYZAAR) 100-25 MG tablet TAKE 1 TABLET BY MOUTH ONCE DAILY  . metFORMIN (GLUCOPHAGE) 1000 MG tablet TAKE 1 TABLET BY MOUTH TWICE A DAY WITH FOOD  . ONETOUCH DELICA LANCETS 87G MISC Use to check BG 1 to 2 times daily.  Dx:  E11.9  . pantoprazole (PROTONIX) 40 MG tablet TAKE ONE (1) TABLET EACH DAY  . sildenafil (REVATIO) 20 MG tablet 2 po prn as needed   No facility-administered encounter medications on file as of 04/29/2019.    Past Surgical History:  Procedure Laterality Date  . ANTERIOR CRUCIATE LIGAMENT REPAIR      Family History  Problem Relation Age of Onset  . Hypertension Father   . Diabetes Father   . Cancer Mother   . Diabetes Paternal Grandfather   . Colon cancer Neg Hx   . Esophageal cancer Neg Hx   . Rectal cancer Neg  Hx   . Stomach cancer Neg Hx   . Prostate cancer Neg Hx   . Pancreatic cancer Neg Hx     New complaints: None today  Social history: Lives with his wife- works at [employer]  Controlled substance contract: n/a    Review of Systems  Constitutional: Negative for diaphoresis.  Eyes: Negative for pain.  Respiratory: Negative for shortness of breath.   Cardiovascular: Negative for chest pain, palpitations and leg swelling.  Gastrointestinal: Negative for abdominal pain.  Endocrine: Negative for polydipsia.  Skin: Negative for rash.  Neurological: Negative for dizziness, weakness and headaches.  Hematological: Does not bruise/bleed easily.  All other systems reviewed and are negative.      Objective:    Physical Exam Vitals and nursing note reviewed.  Constitutional:      Appearance: Normal appearance. He is well-developed.  HENT:     Head: Normocephalic.     Nose: Nose normal.  Eyes:     Pupils: Pupils are equal, round, and reactive to light.  Neck:     Thyroid: No thyroid mass or thyromegaly.     Vascular: No carotid bruit or JVD.     Trachea: Phonation normal.  Cardiovascular:     Rate and Rhythm: Normal rate and regular rhythm.  Pulmonary:     Effort: Pulmonary effort is normal. No respiratory distress.     Breath sounds: Normal breath sounds.  Abdominal:     General: Bowel sounds are normal.     Palpations: Abdomen is soft.     Tenderness: There is no abdominal tenderness.  Musculoskeletal:        General: Normal range of motion.     Cervical back: Normal range of motion and neck supple.  Lymphadenopathy:     Cervical: No cervical adenopathy.  Skin:    General: Skin is warm and dry.  Neurological:     Mental Status: He is alert and oriented to person, place, and time.  Psychiatric:        Behavior: Behavior normal.        Thought Content: Thought content normal.        Judgment: Judgment normal.    BP 140/76   Pulse 68   Temp 98.7 F (37.1 C) (Temporal)   Resp 20   Ht '5\' 10"'$  (1.778 m)   Wt 253 lb (114.8 kg)   SpO2 97%   BMI 36.30 kg/m   hgba1c 7.5%       Assessment & Plan:  Clayton [name] comes in today with chief complaint of Medical Management of Chronic Issues   Diagnosis and orders addressed:  1. Essential hypertension, benign *low sodium diet** - CBC with Differential/Platelet - CMP14+EGFR - losartan-hydrochlorothiazide (HYZAAR) 100-25 MG tablet; TAKE 1 TABLET BY MOUTH ONCE DAILY  Dispense: 90 tablet; Refill: 1  2. Hyperlipidemia associated with type 2 diabetes mellitus (HCC) Low fat diet - Lipid panel - fenofibrate (TRICOR) 145 MG tablet; Take 1 tablet (145 mg total) by mouth daily.  Dispense: 90 tablet; Refill: 1  3. Type 2 diabetes  mellitus without complication, without long-term current use of insulin (HCC) Continue to watch carbs in diet - Bayer DCA Hb A1c Waived - metFORMIN (GLUCOPHAGE) 1000 MG tablet; TAKE 1 TABLET BY MOUTH TWICE A DAY WITH FOOD  Dispense: 180 tablet; Refill: 1 - glimepiride (AMARYL) 2 MG tablet; Take 1 tablet (2 mg total) by mouth daily before breakfast.  Dispense: 90 tablet; Refill: 1  4. Recurrent  major depressive disorder, in full remission ([place]) Stress management - escitalopram (LEXAPRO) 10 MG tablet; TAKE 1 TABLET BY MOUTH EVERY DAY  Dispense: 90 tablet; Refill: 1  5. OSA (obstructive sleep apnea) Continue to wear CPAP  6. Gastroesophageal reflux disease without esophagitis Avoid spicy foods Do not eat 2 hours prior to bedtime - pantoprazole (PROTONIX) 40 MG tablet; Take 1 tablet (40 mg total) by mouth daily.  Dispense: 90 tablet; Refill: 1  7. Morbid obesity ([place]) Discussed diet and exercise for person with BMI >25 Will recheck weight in 3-6 months  8. Prostate cancer screening - PSA, total and free   Labs pending Health Maintenance reviewed Diet and exercise encouraged  Follow up plan: 3 months   Mary-Margaret Hassell Done, FNP

## 2019-04-30 LAB — CMP14+EGFR
ALT: 20 IU/L (ref 0–44)
AST: 15 IU/L (ref 0–40)
Albumin/Globulin Ratio: 1.5 (ref 1.2–2.2)
Albumin: 4.3 g/dL (ref 3.8–4.9)
Alkaline Phosphatase: 86 IU/L (ref 39–117)
BUN/Creatinine Ratio: 21 — ABNORMAL HIGH (ref 9–20)
BUN: 16 mg/dL (ref 6–24)
Bilirubin Total: 0.6 mg/dL (ref 0.0–1.2)
CO2: 25 mmol/L (ref 20–29)
Calcium: 9.3 mg/dL (ref 8.7–10.2)
Chloride: 101 mmol/L (ref 96–106)
Creatinine, Ser: 0.78 mg/dL (ref 0.76–1.27)
GFR calc Af Amer: 116 mL/min/{1.73_m2} (ref >59)
GFR calc non Af Amer: 100 mL/min/{1.73_m2} (ref >59)
Globulin, Total: 2.9 g/dL (ref 1.5–4.5)
Glucose: 139 mg/dL — ABNORMAL HIGH (ref 65–99)
Potassium: 4.2 mmol/L (ref 3.5–5.2)
Sodium: 137 mmol/L (ref 134–144)
Total Protein: 7.2 g/dL (ref 6.0–8.5)

## 2019-04-30 LAB — CBC WITH DIFFERENTIAL/PLATELET
Basophils Absolute: 0.1 10*3/uL (ref 0.0–0.2)
Basos: 1 %
EOS (ABSOLUTE): 0.3 10*3/uL (ref 0.0–0.4)
Eos: 3 %
Hematocrit: 41.1 % (ref 37.5–51.0)
Hemoglobin: 14 g/dL (ref 13.0–17.7)
Immature Grans (Abs): 0 10*3/uL (ref 0.0–0.1)
Immature Granulocytes: 0 %
Lymphocytes Absolute: 2.3 10*3/uL (ref 0.7–3.1)
Lymphs: 24 %
MCH: 30.8 pg (ref 26.6–33.0)
MCHC: 34.1 g/dL (ref 31.5–35.7)
MCV: 91 fL (ref 79–97)
Monocytes Absolute: 0.8 10*3/uL (ref 0.1–0.9)
Monocytes: 9 %
Neutrophils Absolute: 6.1 10*3/uL (ref 1.4–7.0)
Neutrophils: 63 %
Platelets: 271 10*3/uL (ref 150–450)
RBC: 4.54 x10E6/uL (ref 4.14–5.80)
RDW: 12.3 % (ref 11.6–15.4)
WBC: 9.7 10*3/uL (ref 3.4–10.8)

## 2019-04-30 LAB — LIPID PANEL
Chol/HDL Ratio: 5.8 ratio — ABNORMAL HIGH (ref 0.0–5.0)
Cholesterol, Total: 184 mg/dL (ref 100–199)
HDL: 32 mg/dL — ABNORMAL LOW (ref >39)
LDL Chol Calc (NIH): 114 mg/dL — ABNORMAL HIGH (ref 0–99)
Triglycerides: 214 mg/dL — ABNORMAL HIGH (ref 0–149)
VLDL Cholesterol Cal: 38 mg/dL (ref 5–40)

## 2019-04-30 LAB — PSA, TOTAL AND FREE
PSA, Free Pct: 12.9 %
PSA, Free: 0.18 ng/mL
Prostate Specific Ag, Serum: 1.4 ng/mL (ref 0.0–4.0)

## 2019-05-07 MED ORDER — ROSUVASTATIN CALCIUM 10 MG PO TABS: 10.0000 mg | ORAL_TABLET | Freq: Every day | ORAL | 1 refills | Status: DC

## 2019-05-07 NOTE — Addendum Note (Signed)
Addended by: Chevis Pretty on: 05/07/2019 09:00 AM   Modules accepted: Orders

## 2019-07-29 ENCOUNTER — Ambulatory Visit: Payer: BC Managed Care – PPO | Admitting: Nurse Practitioner

## 2019-07-29 ENCOUNTER — Encounter: Payer: Self-pay | Admitting: Nurse Practitioner

## 2019-07-29 ENCOUNTER — Other Ambulatory Visit: Payer: Self-pay

## 2019-07-29 VITALS — BP 137/81 | HR 70 | Temp 98.6°F | Resp 20 | Ht 70.0 in | Wt 246.0 lb

## 2019-07-29 DIAGNOSIS — K219 Gastro-esophageal reflux disease without esophagitis: Secondary | ICD-10-CM

## 2019-07-29 DIAGNOSIS — E119 Type 2 diabetes mellitus without complications: Secondary | ICD-10-CM

## 2019-07-29 DIAGNOSIS — E1169 Type 2 diabetes mellitus with other specified complication: Secondary | ICD-10-CM | POA: Diagnosis not present

## 2019-07-29 DIAGNOSIS — G4733 Obstructive sleep apnea (adult) (pediatric): Secondary | ICD-10-CM

## 2019-07-29 DIAGNOSIS — I10 Essential (primary) hypertension: Secondary | ICD-10-CM

## 2019-07-29 DIAGNOSIS — F3342 Major depressive disorder, recurrent, in full remission: Secondary | ICD-10-CM

## 2019-07-29 DIAGNOSIS — E785 Hyperlipidemia, unspecified: Secondary | ICD-10-CM

## 2019-07-29 LAB — BAYER DCA HB A1C WAIVED: HB A1C (BAYER DCA - WAIVED): 7.8 % — ABNORMAL HIGH (ref <7.0)

## 2019-07-29 MED ORDER — METFORMIN HCL 1000 MG PO TABS: ORAL_TABLET | ORAL | 1 refills | Status: DC

## 2019-07-29 MED ORDER — GLIMEPIRIDE 2 MG PO TABS: 2.0000 mg | ORAL_TABLET | Freq: Every day | ORAL | 1 refills | Status: DC

## 2019-07-29 MED ORDER — FENOFIBRATE 145 MG PO TABS: 145.0000 mg | ORAL_TABLET | Freq: Every day | ORAL | 1 refills | Status: DC

## 2019-07-29 MED ORDER — LOSARTAN POTASSIUM-HCTZ 100-25 MG PO TABS: ORAL_TABLET | ORAL | 1 refills | Status: DC

## 2019-07-29 MED ORDER — PANTOPRAZOLE SODIUM 40 MG PO TBEC: 40.0000 mg | DELAYED_RELEASE_TABLET | Freq: Every day | ORAL | 1 refills | Status: DC

## 2019-07-29 MED ORDER — ROSUVASTATIN CALCIUM 10 MG PO TABS: 10.0000 mg | ORAL_TABLET | Freq: Every day | ORAL | 1 refills | Status: DC

## 2019-07-29 MED ORDER — ESCITALOPRAM OXALATE 10 MG PO TABS: ORAL_TABLET | ORAL | 1 refills | Status: DC

## 2019-07-29 NOTE — Progress Notes (Addendum)
Subjective:    Patient ID: Clayton [name], male    DOB: 03/27/1960, 59 y.o.   MRN: [id number]   Chief Complaint: Medical Management of Chronic Issues    HPI:  1. Essential hypertension, benign No c/o chest pain, sob or headaches. Does not check blood pressure at [facility]. BP Readings from Last 3 Encounters:  07/29/19 137/81  04/29/19 140/76  01/21/19 (!) 141/82     2. Hyperlipidemia associated with type 2 diabetes mellitus ([place]) Does try to watch diet. Does no dedicated exercise but does stay very active. Lab Results  Component Value Date   CHOL 184 04/29/2019   HDL 32 (L) 04/29/2019   LDLCALC 114 (H) 04/29/2019   LDLDIRECT 70 09/24/2014   TRIG 214 (H) 04/29/2019   CHOLHDL 5.8 (H) 04/29/2019     3. Type 2 diabetes mellitus without complication, without long-term current use of insulin (HCC) Fasting blood sugars are running around. 120's. He check mot day. He denies any symptom of low blood sugar. Lab Results  Component Value Date   HGBA1C 7.5 (H) 04/29/2019     4. Gastroesophageal reflux disease without esophagitis Takes protonix daily and symptom are under control.  5. OSA (obstructive sleep apnea) Wears CPAP nightly and feel rested in mornings  6. Recurrent major depressive disorder, in full remission ([place]) Is on lexapro and is doing well.  Depression screen [facility] 2/9 07/29/2019 04/29/2019 01/21/2019  Decreased Interest 0 0 0  Down, Depressed, Hopeless 0 0 0  PHQ - 2 Score 0 0 0     7. Morbid obesity ([place]) Weight is down 7lb since last viist Wt Readings from Last 3 Encounters:  07/29/19 246 lb (111.6 kg)  04/29/19 253 lb (114.8 kg)  01/21/19 247 lb (112 kg)   BMI Readings from Last 3 Encounters:  07/29/19 35.30 kg/m  04/29/19 36.30 kg/m  01/21/19 35.44 kg/m       Outpatient Encounter Medications as of 07/29/2019  Medication Sig  . aspirin 81 MG EC tablet Take 1 tablet (81 mg total) by mouth daily. Swallow whole.  . escitalopram (LEXAPRO) 10 MG  tablet TAKE 1 TABLET BY MOUTH EVERY DAY  . glimepiride (AMARYL) 2 MG tablet Take 1 tablet (2 mg total) by mouth daily before breakfast.  . glucose blood (ONETOUCH VERIO) test strip Use to check BG 1 to 2 times daily Dx: E11.9  . losartan-hydrochlorothiazide (HYZAAR) 100-25 MG tablet TAKE 1 TABLET BY MOUTH ONCE DAILY  . metFORMIN (GLUCOPHAGE) 1000 MG tablet TAKE 1 TABLET BY MOUTH TWICE A DAY WITH FOOD  . ONETOUCH DELICA LANCETS 03B MISC Use to check BG 1 to 2 times daily.  Dx:  E11.9  . pantoprazole (PROTONIX) 40 MG tablet Take 1 tablet (40 mg total) by mouth daily.  . rosuvastatin (CRESTOR) 10 MG tablet Take 1 tablet (10 mg total) by mouth daily.  . sildenafil (REVATIO) 20 MG tablet 2 po prn as needed  . fenofibrate (TRICOR) 145 MG tablet Take 1 tablet (145 mg total) by mouth daily.     Past Surgical History:  Procedure Laterality Date  . ANTERIOR CRUCIATE LIGAMENT REPAIR      Family History  Problem Relation Age of Onset  . Hypertension Father   . Diabetes Father   . Cancer Mother   . Diabetes Paternal Grandfather   . Colon cancer Neg Hx   . Esophageal cancer Neg Hx   . Rectal cancer Neg Hx   . Stomach cancer Neg Hx   .  Prostate cancer Neg Hx   . Pancreatic cancer Neg Hx     New complaints: none today  Social history: Lives with wife  Controlled substance contract: n/a    Review of Systems  Constitutional: Negative for diaphoresis.  Eyes: Negative for pain.  Respiratory: Negative for shortness of breath.   Cardiovascular: Negative for chest pain, palpitations and leg swelling.  Gastrointestinal: Negative for abdominal pain.  Endocrine: Negative for polydipsia.  Skin: Negative for rash.  Neurological: Negative for dizziness, weakness and headaches.  Hematological: Does not bruise/bleed easily.  All other systems reviewed and are negative.      Objective:   Physical Exam Vitals and nursing note reviewed.  Constitutional:      Appearance: Normal appearance.  He is well-developed.  HENT:     Head: Normocephalic.     Nose: Nose normal.  Eyes:     Pupils: Pupils are equal, round, and reactive to light.  Neck:     Thyroid: No thyroid mass or thyromegaly.     Vascular: No carotid bruit or JVD.     Trachea: Phonation normal.  Cardiovascular:     Rate and Rhythm: Normal rate and regular rhythm.  Pulmonary:     Effort: Pulmonary effort is normal. No respiratory distress.     Breath sounds: Normal breath sounds.  Abdominal:     General: Bowel sounds are normal.     Palpations: Abdomen is soft.     Tenderness: There is no abdominal tenderness.  Musculoskeletal:        General: Normal range of motion.     Cervical back: Normal range of motion and neck supple.  Lymphadenopathy:     Cervical: No cervical adenopathy.  Skin:    General: Skin is warm and dry.  Neurological:     Mental Status: He is alert and oriented to person, place, and time.  Psychiatric:        Behavior: Behavior normal.        Thought Content: Thought content normal.        Judgment: Judgment normal.   BP 137/81   Pulse 70   Temp 98.6 F (37 C) (Temporal)   Resp 20   Ht '5\' 10"'$  (1.778 m)   Wt 246 lb (111.6 kg)   SpO2 96%   BMI 35.30 kg/m   hgba1c 7.8        Assessment & Plan:  PRISCILLA FINKLEA comes in today with chief complaint of Medical Management of Chronic Issues   Diagnosis and orders addressed:  1. Essential hypertension, benign Low sodium diet - CBC with Differential/Platelet - CMP14+EGFR - losartan-hydrochlorothiazide (HYZAAR) 100-25 MG tablet; TAKE 1 TABLET BY MOUTH ONCE DAILY  Dispense: 90 tablet; Refill: 1  2. Hyperlipidemia associated with type 2 diabetes mellitus (HCC) Low fat diet - Lipid panel - fenofibrate (TRICOR) 145 MG tablet; Take 1 tablet (145 mg total) by mouth daily.  Dispense: 90 tablet; Refill: 1  3. Type 2 diabetes mellitus without complication, without long-term current use of insulin (HCC) stricter carb counting -  Bayer DCA Hb A1c Waived - Microalbumin / creatinine urine ratio - glimepiride (AMARYL) 2 MG tablet; Take 1 tablet (2 mg total) by mouth daily before breakfast.  Dispense: 90 tablet; Refill: 1 - metFORMIN (GLUCOPHAGE) 1000 MG tablet; TAKE 1 TABLET BY MOUTH TWICE A DAY WITH FOOD  Dispense: 180 tablet; Refill: 1  4. Gastroesophageal reflux disease without esophagitis Avoid spicy foods Do not eat 2 hours prior to bedtime -  pantoprazole (PROTONIX) 40 MG tablet; Take 1 tablet (40 mg total) by mouth daily.  Dispense: 90 tablet; Refill: 1  5. OSA (obstructive sleep apnea) Continue to wear cpap  6. Recurrent major depressive disorder, in full remission ([place]) Stress management  - escitalopram (LEXAPRO) 10 MG tablet; TAKE 1 TABLET BY MOUTH EVERY DAY  Dispense: 90 tablet; Refill: 1  7. Morbid obesity ([place]) Discussed diet and exercise for person with BMI >25 Will recheck weight in 3-6 months    Labs pending Health Maintenance reviewed Diet and exercise encouraged  Follow up plan: 3 months   Mary-Margaret Hassell Done, FNP

## 2019-07-29 NOTE — Patient Instructions (Signed)
Carbohydrate Counting for Diabetes Mellitus, Adult  Carbohydrate counting is a method of keeping track of how many carbohydrates you eat. Eating carbohydrates naturally increases the amount of sugar (glucose) in the blood. Counting how many carbohydrates you eat helps keep your blood glucose within normal limits, which helps you manage your diabetes (diabetes mellitus). It is important to know how many carbohydrates you can safely have in each meal. This is different for every person. A diet and nutrition specialist (registered dietitian) can help you make a meal plan and calculate how many carbohydrates you should have at each meal and snack. Carbohydrates are found in the following foods:  Grains, such as breads and cereals.  Dried beans and soy products.  Starchy vegetables, such as potatoes, peas, and corn.  Fruit and fruit juices.  Milk and yogurt.  Sweets and snack foods, such as cake, cookies, candy, chips, and soft drinks. How do I count carbohydrates? There are two ways to count carbohydrates in food. You can use either of the methods or a combination of both. Reading "Nutrition Facts" on packaged food The "Nutrition Facts" list is included on the labels of almost all packaged foods and beverages in the [place] It includes:  The serving size.  Information about nutrients in each serving, including the grams (g) of carbohydrate per serving. To use the "Nutrition Facts":  Decide how many servings you will have.  Multiply the number of servings by the number of carbohydrates per serving.  The resulting number is the total amount of carbohydrates that you will be having. Learning standard serving sizes of other foods When you eat carbohydrate foods that are not packaged or do not include "Nutrition Facts" on the label, you need to measure the servings in order to count the amount of carbohydrates:  Measure the foods that you will eat with a food scale or measuring cup, if  needed.  Decide how many standard-size servings you will eat.  Multiply the number of servings by 15. Most carbohydrate-rich foods have about 15 g of carbohydrates per serving. ? For example, if you eat 8 oz (170 g) of strawberries, you will have eaten 2 servings and 30 g of carbohydrates (2 servings x 15 g = 30 g).  For foods that have more than one food mixed, such as soups and casseroles, you must count the carbohydrates in each food that is included. The following list contains standard serving sizes of common carbohydrate-rich foods. Each of these servings has about 15 g of carbohydrates:   hamburger bun or  English muffin.   oz (15 mL) syrup.   oz (14 g) jelly.  1 slice of bread.  1 six-inch tortilla.  3 oz (85 g) cooked rice or pasta.  4 oz (113 g) cooked dried beans.  4 oz (113 g) starchy vegetable, such as peas, corn, or potatoes.  4 oz (113 g) hot cereal.  4 oz (113 g) mashed potatoes or  of a large baked potato.  4 oz (113 g) canned or frozen fruit.  4 oz (120 mL) fruit juice.  4-6 crackers.  6 chicken nuggets.  6 oz (170 g) unsweetened dry cereal.  6 oz (170 g) plain fat-free yogurt or yogurt sweetened with artificial sweeteners.  8 oz (240 mL) milk.  8 oz (170 g) fresh fruit or one small piece of fruit.  24 oz (680 g) popped popcorn. Example of carbohydrate counting Sample meal  3 oz (85 g) chicken breast.  6 oz (170 g)   brown rice.  4 oz (113 g) corn.  8 oz (240 mL) milk.  8 oz (170 g) strawberries with sugar-free whipped topping. Carbohydrate calculation 1. Identify the foods that contain carbohydrates: ? Rice. ? Corn. ? Milk. ? Strawberries. 2. Calculate how many servings you have of each food: ? 2 servings rice. ? 1 serving corn. ? 1 serving milk. ? 1 serving strawberries. 3. Multiply each number of servings by 15 g: ? 2 servings rice x 15 g = 30 g. ? 1 serving corn x 15 g = 15 g. ? 1 serving milk x 15 g = 15 g. ? 1  serving strawberries x 15 g = 15 g. 4. Add together all of the amounts to find the total grams of carbohydrates eaten: ? 30 g + 15 g + 15 g + 15 g = 75 g of carbohydrates total. Summary  Carbohydrate counting is a method of keeping track of how many carbohydrates you eat.  Eating carbohydrates naturally increases the amount of sugar (glucose) in the blood.  Counting how many carbohydrates you eat helps keep your blood glucose within normal limits, which helps you manage your diabetes.  A diet and nutrition specialist (registered dietitian) can help you make a meal plan and calculate how many carbohydrates you should have at each meal and snack. This information is not intended to replace advice given to you by your health care provider. Make sure you discuss any questions you have with your health care provider. Document Revised: 09/29/2016 Document Reviewed: 08/19/2015 Elsevier Patient Education  2020 Elsevier Inc.  

## 2019-07-30 LAB — CBC WITH DIFFERENTIAL/PLATELET
Basophils Absolute: 0.1 10*3/uL (ref 0.0–0.2)
Basos: 1 %
EOS (ABSOLUTE): 0.3 10*3/uL (ref 0.0–0.4)
Eos: 2 %
Hematocrit: 42.4 % (ref 37.5–51.0)
Hemoglobin: 14.5 g/dL (ref 13.0–17.7)
Immature Grans (Abs): 0 10*3/uL (ref 0.0–0.1)
Immature Granulocytes: 0 %
Lymphocytes Absolute: 2.9 10*3/uL (ref 0.7–3.1)
Lymphs: 28 %
MCH: 31 pg (ref 26.6–33.0)
MCHC: 34.2 g/dL (ref 31.5–35.7)
MCV: 91 fL (ref 79–97)
Monocytes Absolute: 0.8 10*3/uL (ref 0.1–0.9)
Monocytes: 8 %
Neutrophils Absolute: 6.4 10*3/uL (ref 1.4–7.0)
Neutrophils: 61 %
Platelets: 240 10*3/uL (ref 150–450)
RBC: 4.68 x10E6/uL (ref 4.14–5.80)
RDW: 12.9 % (ref 11.6–15.4)
WBC: 10.4 10*3/uL (ref 3.4–10.8)

## 2019-07-30 LAB — CMP14+EGFR
ALT: 15 IU/L (ref 0–44)
AST: 14 IU/L (ref 0–40)
Albumin/Globulin Ratio: 1.7 (ref 1.2–2.2)
Albumin: 4.5 g/dL (ref 3.8–4.9)
Alkaline Phosphatase: 74 IU/L (ref 39–117)
BUN/Creatinine Ratio: 23 — ABNORMAL HIGH (ref 9–20)
BUN: 20 mg/dL (ref 6–24)
Bilirubin Total: 0.8 mg/dL (ref 0.0–1.2)
CO2: 24 mmol/L (ref 20–29)
Calcium: 9.6 mg/dL (ref 8.7–10.2)
Chloride: 98 mmol/L (ref 96–106)
Creatinine, Ser: 0.86 mg/dL (ref 0.76–1.27)
GFR calc Af Amer: 111 mL/min/{1.73_m2} (ref >59)
GFR calc non Af Amer: 96 mL/min/{1.73_m2} (ref >59)
Globulin, Total: 2.7 g/dL (ref 1.5–4.5)
Glucose: 148 mg/dL — ABNORMAL HIGH (ref 65–99)
Potassium: 4 mmol/L (ref 3.5–5.2)
Sodium: 137 mmol/L (ref 134–144)
Total Protein: 7.2 g/dL (ref 6.0–8.5)

## 2019-07-30 LAB — LIPID PANEL
Chol/HDL Ratio: 4.9 ratio (ref 0.0–5.0)
Cholesterol, Total: 127 mg/dL (ref 100–199)
HDL: 26 mg/dL — ABNORMAL LOW (ref >39)
LDL Chol Calc (NIH): 71 mg/dL (ref 0–99)
Triglycerides: 173 mg/dL — ABNORMAL HIGH (ref 0–149)
VLDL Cholesterol Cal: 30 mg/dL (ref 5–40)

## 2019-11-18 ENCOUNTER — Ambulatory Visit: Payer: BC Managed Care – PPO | Admitting: Nurse Practitioner

## 2019-11-18 ENCOUNTER — Encounter: Payer: Self-pay | Admitting: Nurse Practitioner

## 2019-11-18 ENCOUNTER — Other Ambulatory Visit: Payer: Self-pay

## 2019-11-18 VITALS — BP 134/77 | HR 65 | Temp 98.5°F | Resp 20 | Ht 70.0 in | Wt 243.0 lb

## 2019-11-18 DIAGNOSIS — G4733 Obstructive sleep apnea (adult) (pediatric): Secondary | ICD-10-CM | POA: Diagnosis not present

## 2019-11-18 DIAGNOSIS — I10 Essential (primary) hypertension: Secondary | ICD-10-CM

## 2019-11-18 DIAGNOSIS — K219 Gastro-esophageal reflux disease without esophagitis: Secondary | ICD-10-CM

## 2019-11-18 DIAGNOSIS — E119 Type 2 diabetes mellitus without complications: Secondary | ICD-10-CM

## 2019-11-18 DIAGNOSIS — E1169 Type 2 diabetes mellitus with other specified complication: Secondary | ICD-10-CM | POA: Diagnosis not present

## 2019-11-18 DIAGNOSIS — F3342 Major depressive disorder, recurrent, in full remission: Secondary | ICD-10-CM

## 2019-11-18 DIAGNOSIS — E785 Hyperlipidemia, unspecified: Secondary | ICD-10-CM

## 2019-11-18 LAB — CMP14+EGFR
ALT: 15 IU/L (ref 0–44)
AST: 15 IU/L (ref 0–40)
Albumin/Globulin Ratio: 1.7 (ref 1.2–2.2)
Albumin: 4.5 g/dL (ref 3.8–4.9)
Alkaline Phosphatase: 69 IU/L (ref 48–121)
BUN/Creatinine Ratio: 28 — ABNORMAL HIGH (ref 9–20)
BUN: 24 mg/dL (ref 6–24)
Bilirubin Total: 0.7 mg/dL (ref 0.0–1.2)
CO2: 25 mmol/L (ref 20–29)
Calcium: 9.6 mg/dL (ref 8.7–10.2)
Chloride: 98 mmol/L (ref 96–106)
Creatinine, Ser: 0.85 mg/dL (ref 0.76–1.27)
GFR calc Af Amer: 112 mL/min/{1.73_m2} (ref >59)
GFR calc non Af Amer: 97 mL/min/{1.73_m2} (ref >59)
Globulin, Total: 2.7 g/dL (ref 1.5–4.5)
Glucose: 134 mg/dL — ABNORMAL HIGH (ref 65–99)
Potassium: 4.1 mmol/L (ref 3.5–5.2)
Sodium: 137 mmol/L (ref 134–144)
Total Protein: 7.2 g/dL (ref 6.0–8.5)

## 2019-11-18 LAB — CBC WITH DIFFERENTIAL/PLATELET
Basophils Absolute: 0.1 10*3/uL (ref 0.0–0.2)
Basos: 1 %
EOS (ABSOLUTE): 0.4 10*3/uL (ref 0.0–0.4)
Eos: 4 %
Hematocrit: 42.9 % (ref 37.5–51.0)
Hemoglobin: 14.4 g/dL (ref 13.0–17.7)
Immature Grans (Abs): 0 10*3/uL (ref 0.0–0.1)
Immature Granulocytes: 0 %
Lymphocytes Absolute: 2.4 10*3/uL (ref 0.7–3.1)
Lymphs: 24 %
MCH: 30.8 pg (ref 26.6–33.0)
MCHC: 33.6 g/dL (ref 31.5–35.7)
MCV: 92 fL (ref 79–97)
Monocytes Absolute: 0.8 10*3/uL (ref 0.1–0.9)
Monocytes: 8 %
Neutrophils Absolute: 6.3 10*3/uL (ref 1.4–7.0)
Neutrophils: 63 %
Platelets: 253 10*3/uL (ref 150–450)
RBC: 4.67 x10E6/uL (ref 4.14–5.80)
RDW: 12.5 % (ref 11.6–15.4)
WBC: 10 10*3/uL (ref 3.4–10.8)

## 2019-11-18 LAB — LIPID PANEL
Chol/HDL Ratio: 4.9 ratio (ref 0.0–5.0)
Cholesterol, Total: 123 mg/dL (ref 100–199)
HDL: 25 mg/dL — ABNORMAL LOW (ref >39)
LDL Chol Calc (NIH): 70 mg/dL (ref 0–99)
Triglycerides: 161 mg/dL — ABNORMAL HIGH (ref 0–149)
VLDL Cholesterol Cal: 28 mg/dL (ref 5–40)

## 2019-11-18 LAB — BAYER DCA HB A1C WAIVED: HB A1C (BAYER DCA - WAIVED): 7.1 % — ABNORMAL HIGH (ref <7.0)

## 2019-11-18 MED ORDER — PANTOPRAZOLE SODIUM 40 MG PO TBEC: 40.0000 mg | DELAYED_RELEASE_TABLET | Freq: Every day | ORAL | 1 refills | Status: DC

## 2019-11-18 MED ORDER — ESCITALOPRAM OXALATE 10 MG PO TABS: ORAL_TABLET | ORAL | 1 refills | Status: DC

## 2019-11-18 MED ORDER — LOSARTAN POTASSIUM-HCTZ 100-25 MG PO TABS: ORAL_TABLET | ORAL | 1 refills | Status: DC

## 2019-11-18 MED ORDER — METFORMIN HCL 1000 MG PO TABS: ORAL_TABLET | ORAL | 1 refills | Status: DC

## 2019-11-18 MED ORDER — GLIMEPIRIDE 2 MG PO TABS: 2.0000 mg | ORAL_TABLET | Freq: Every day | ORAL | 1 refills | Status: DC

## 2019-11-18 MED ORDER — ROSUVASTATIN CALCIUM 10 MG PO TABS: 10.0000 mg | ORAL_TABLET | Freq: Every day | ORAL | 1 refills | Status: DC

## 2019-11-18 MED ORDER — FENOFIBRATE 145 MG PO TABS: 145.0000 mg | ORAL_TABLET | Freq: Every day | ORAL | 1 refills | Status: DC

## 2019-11-18 NOTE — Progress Notes (Signed)
Subjective:    Patient ID: Clayton [name], male    DOB: Apr 30, 1960, 59 y.o.   MRN: [id number]   Chief Complaint: Medical Management of Chronic Issues    HPI:  1. Essential hypertension, benign No c/o chest pain, SOB or headache. Does not check blood pressure at home. BP Readings from Last 3 Encounters:  11/18/19 134/77  07/29/19 137/81  04/29/19 140/76      2. Hyperlipidemia associated with type 2 diabetes mellitus ([place]) Does not watch diet and does little to no exercise. Lab Results  Component Value Date   CHOL 127 07/29/2019   HDL 26 (L) 07/29/2019   LDLCALC 71 07/29/2019   LDLDIRECT 70 09/24/2014   TRIG 173 (H) 07/29/2019   CHOLHDL 4.9 07/29/2019     3. OSA (obstructive sleep apnea) Wears CPAP nightly. Says he feels rested in mornings  4. Gastroesophageal reflux disease without esophagitis Takes protonix daily and that seems to work well to keep symptoms under control.  5. Type 2 diabetes mellitus without complication, without long-term current use of insulin (HCC)  Fasting blood sugar are running below 130 in mornings. He denies any symptoms of low blood sugar. Lab Results  Component Value Date   HGBA1C 7.8 (H) 07/29/2019    6. Recurrent major depressive disorder, in full remission ([place]) Is on lexapro daily which is working well without side effects. Depression screen [facility] 2/9 11/18/2019 07/29/2019 04/29/2019  Decreased Interest 0 0 0  Down, Depressed, Hopeless 0 0 0  PHQ - 2 Score 0 0 0     7. Morbid obesity ([place]) Weight is down 3lbs Wt Readings from Last 3 Encounters:  11/18/19 243 lb (110.2 kg)  07/29/19 246 lb (111.6 kg)  04/29/19 253 lb (114.8 kg)   BMI Readings from Last 3 Encounters:  11/18/19 34.87 kg/m  07/29/19 35.30 kg/m  04/29/19 36.30 kg/m       Outpatient Encounter Medications as of 11/18/2019  Medication Sig  . aspirin 81 MG EC tablet Take 1 tablet (81 mg total) by mouth daily. Swallow whole.  . escitalopram (LEXAPRO) 10  MG tablet TAKE 1 TABLET BY MOUTH EVERY DAY  . fenofibrate (TRICOR) 145 MG tablet Take 1 tablet (145 mg total) by mouth daily.  Marland Kitchen glimepiride (AMARYL) 2 MG tablet Take 1 tablet (2 mg total) by mouth daily before breakfast.  . glucose blood (ONETOUCH VERIO) test strip Use to check BG 1 to 2 times daily Dx: E11.9  . losartan-hydrochlorothiazide (HYZAAR) 100-25 MG tablet TAKE 1 TABLET BY MOUTH ONCE DAILY  . metFORMIN (GLUCOPHAGE) 1000 MG tablet TAKE 1 TABLET BY MOUTH TWICE A DAY WITH FOOD  . ONETOUCH DELICA LANCETS 92E MISC Use to check BG 1 to 2 times daily.  Dx:  E11.9  . pantoprazole (PROTONIX) 40 MG tablet Take 1 tablet (40 mg total) by mouth daily.  . rosuvastatin (CRESTOR) 10 MG tablet Take 1 tablet (10 mg total) by mouth daily.  . sildenafil (REVATIO) 20 MG tablet 2 po prn as needed     Past Surgical History:  Procedure Laterality Date  . ANTERIOR CRUCIATE LIGAMENT REPAIR      Family History  Problem Relation Age of Onset  . Hypertension Father   . Diabetes Father   . Cancer Mother   . Diabetes Paternal Grandfather   . Colon cancer Neg Hx   . Esophageal cancer Neg Hx   . Rectal cancer Neg Hx   . Stomach cancer Neg Hx   . Prostate  cancer Neg Hx   . Pancreatic cancer Neg Hx     New complaints: None today  Social history: Lives with wife.  Controlled substance contract: n/a    Review of Systems  Constitutional: Negative for diaphoresis.  Eyes: Negative for pain.  Respiratory: Negative for shortness of breath.   Cardiovascular: Negative for chest pain, palpitations and leg swelling.  Gastrointestinal: Negative for abdominal pain.  Endocrine: Negative for polydipsia.  Skin: Negative for rash.  Neurological: Negative for dizziness, weakness and headaches.  Hematological: Does not bruise/bleed easily.  All other systems reviewed and are negative.      Objective:   Physical Exam Vitals and nursing note reviewed.  Constitutional:      Appearance: Normal  appearance. He is well-developed.  HENT:     Head: Normocephalic.     Nose: Nose normal.  Eyes:     Pupils: Pupils are equal, round, and reactive to light.  Neck:     Thyroid: No thyroid mass or thyromegaly.     Vascular: No carotid bruit or JVD.     Trachea: Phonation normal.  Cardiovascular:     Rate and Rhythm: Normal rate and regular rhythm.  Pulmonary:     Effort: Pulmonary effort is normal. No respiratory distress.     Breath sounds: Normal breath sounds.  Abdominal:     General: Bowel sounds are normal.     Palpations: Abdomen is soft.     Tenderness: There is no abdominal tenderness.  Musculoskeletal:        General: Normal range of motion.     Cervical back: Normal range of motion and neck supple.  Lymphadenopathy:     Cervical: No cervical adenopathy.  Skin:    General: Skin is warm and dry.  Neurological:     Mental Status: He is alert and oriented to person, place, and time.  Psychiatric:        Behavior: Behavior normal.        Thought Content: Thought content normal.        Judgment: Judgment normal.    BP 134/77   Pulse 65   Temp 98.5 F (36.9 C) (Temporal)   Resp 20   Ht $R'5\' 10"'Yz$  (1.778 m)   Wt 243 lb (110.2 kg)   SpO2 96%   BMI 34.87 kg/m   hgba1c 7.1%      Assessment & Plan:  Clayton [name] comes in today with chief complaint of Medical Management of Chronic Issues   Diagnosis and orders addressed:  1. Essential hypertension, benign Low sodium diet - CBC with Differential/Platelet - CMP14+EGFR - losartan-hydrochlorothiazide (HYZAAR) 100-25 MG tablet; TAKE 1 TABLET BY MOUTH ONCE DAILY  Dispense: 90 tablet; Refill: 1  2. Hyperlipidemia associated with type 2 diabetes mellitus (HCC) Low fat diet - Lipid panel - fenofibrate (TRICOR) 145 MG tablet; Take 1 tablet (145 mg total) by mouth daily.  Dispense: 90 tablet; Refill: 1  3. OSA (obstructive sleep apnea) Continue to wear CPAP  4. Gastroesophageal reflux disease without  esophagitis Avoid spicy foods Do not eat 2 hours prior to bedtime - pantoprazole (PROTONIX) 40 MG tablet; Take 1 tablet (40 mg total) by mouth daily.  Dispense: 90 tablet; Refill: 1  5. Type 2 diabetes mellitus without complication, without long-term current use of insulin (HCC) Continue to watch carbs in diet - Bayer DCA Hb A1c Waived - Microalbumin / creatinine urine ratio - metFORMIN (GLUCOPHAGE) 1000 MG tablet; TAKE 1 TABLET BY MOUTH TWICE A DAY  WITH FOOD  Dispense: 180 tablet; Refill: 1 - glimepiride (AMARYL) 2 MG tablet; Take 1 tablet (2 mg total) by mouth daily before breakfast.  Dispense: 90 tablet; Refill: 1  6. Recurrent major depressive disorder, in full remission ([place]) Stress management - escitalopram (LEXAPRO) 10 MG tablet; TAKE 1 TABLET BY MOUTH EVERY DAY  Dispense: 90 tablet; Refill: 1  7. Morbid obesity ([place]) Discussed diet and exercise for person with BMI >25 Will recheck weight in 3-6 months    Labs pending Health Maintenance reviewed Diet and exercise encouraged  Follow up plan:  3 month   [place], FNP

## 2019-11-18 NOTE — Patient Instructions (Signed)
Stress, Adult Stress is a normal reaction to life events. Stress is what you feel when life demands more than you are used to, or more than you think you can handle. Some stress can be useful, such as studying for a test or meeting a deadline at work. Stress that occurs too often or for too long can cause problems. It can affect your emotional health and interfere with relationships and normal daily activities. Too much stress can weaken your body's defense system (immune system) and increase your risk for physical illness. If you already have a medical problem, stress can make it worse. What are the causes? All sorts of life events can cause stress. An event that causes stress for one person may not be stressful for another person. Major life events, whether positive or negative, commonly cause stress. Examples include:  Losing a job or starting a new job.  Losing a loved one.  Moving to a new town or home.  Getting married or divorced.  Having a baby.  Getting injured or sick. Less obvious life events can also cause stress, especially if they occur day after day or in combination with each other. Examples include:  Working long hours.  Driving in traffic.  Caring for children.  Being in debt.  Being in a difficult relationship. What are the signs or symptoms? Stress can cause emotional symptoms, including:  Anxiety. This is feeling worried, afraid, on edge, overwhelmed, or out of control.  Anger, including irritation or impatience.  Depression. This is feeling sad, down, helpless, or guilty.  Trouble focusing, remembering, or making decisions. Stress can cause physical symptoms, including:  Aches and pains. These may affect your head, neck, back, stomach, or other areas of your body.  Tight muscles or a clenched jaw.  Low energy.  Trouble sleeping. Stress can cause unhealthy behaviors, including:  Eating to feel better (overeating) or skipping meals.  Working too  much or putting off tasks.  Smoking, drinking alcohol, or using drugs to feel better. How is this diagnosed? Stress is diagnosed through an assessment by your health care provider. He or she may diagnose this condition based on:  Your symptoms and any stressful life events.  Your medical history.  Tests to rule out other causes of your symptoms. Depending on your condition, your health care provider may refer you to a specialist for further evaluation. How is this treated?  Stress management techniques are the recommended treatment for stress. Medicine is not typically recommended for the treatment of stress. Techniques to reduce your reaction to stressful life events include:  Stress identification. Monitor yourself for symptoms of stress and identify what causes stress for you. These skills may help you to avoid or prepare for stressful events.  Time management. Set your priorities, keep a calendar of events, and learn to say no. Taking these actions can help you avoid making too many commitments. Techniques for coping with stress include:  Rethinking the problem. Try to think realistically about stressful events rather than ignoring them or overreacting. Try to find the positives in a stressful situation rather than focusing on the negatives.  Exercise. Physical exercise can release both physical and emotional tension. The key is to find a form of exercise that you enjoy and do it regularly.  Relaxation techniques. These relax the body and mind. The key is to find one or more that you enjoy and use the techniques regularly. Examples include: ? Meditation, deep breathing, or progressive relaxation techniques. ? Yoga or   tai chi. ? Biofeedback, mindfulness techniques, or journaling. ? Listening to music, being out in nature, or participating in other hobbies.  Practicing a healthy lifestyle. Eat a balanced diet, drink plenty of water, limit or avoid caffeine, and get plenty of  sleep.  Having a strong support network. Spend time with family, friends, or other people you enjoy being around. Express your feelings and talk things over with someone you trust. Counseling or talk therapy with a mental health professional may be helpful if you are having trouble managing stress on your own. Follow these instructions at home: Lifestyle   Avoid drugs.  Do not use any products that contain nicotine or tobacco, such as cigarettes, e-cigarettes, and chewing tobacco. If you need help quitting, ask your health care provider.  Limit alcohol intake to no more than 1 drink a day for nonpregnant women and 2 drinks a day for men. One drink equals 12 oz of beer, 5 oz of wine, or 1 oz of hard liquor  Do not use alcohol or drugs to relax.  Eat a balanced diet that includes fresh fruits and vegetables, whole grains, lean meats, fish, eggs, and beans, and low-fat dairy. Avoid processed foods and foods high in added fat, sugar, and salt.  Exercise at least 30 minutes on 5 or more days each week.  Get 7-8 hours of sleep each night. General instructions   Practice stress management techniques as discussed with your health care provider.  Drink enough fluid to keep your urine clear or pale yellow.  Take over-the-counter and prescription medicines only as told by your health care provider.  Keep all follow-up visits as told by your health care provider. This is important. Contact a health care provider if:  Your symptoms get worse.  You have new symptoms.  You feel overwhelmed by your problems and can no longer manage them on your own. Get help right away if:  You have thoughts of hurting yourself or others. If you ever feel like you may hurt yourself or others, or have thoughts about taking your own life, get help right away. You can go to your nearest emergency department or call:  Your local emergency services (911 in the [place]  A suicide crisis helpline, such as the  [place] at (316) 250-6172. This is open 24 hours a day. Summary  Stress is a normal reaction to life events. It can cause problems if it happens too often or for too long.  Practicing stress management techniques is the best way to treat stress.  Counseling or talk therapy with a mental health professional may be helpful if you are having trouble managing stress on your own. This information is not intended to replace advice given to you by your health care provider. Make sure you discuss any questions you have with your health care provider. Document Revised: 10/05/2018 Document Reviewed: 04/27/2016 Elsevier Patient Education  [place].

## 2019-11-19 LAB — MICROALBUMIN / CREATININE URINE RATIO
Creatinine, Urine: 127.7 mg/dL
Microalb/Creat Ratio: 8 mg/g creat (ref 0–29)
Microalbumin, Urine: 9.7 ug/mL

## 2019-12-04 ENCOUNTER — Other Ambulatory Visit: Payer: Self-pay

## 2019-12-04 ENCOUNTER — Encounter: Payer: Self-pay | Admitting: *Deleted

## 2019-12-04 ENCOUNTER — Emergency Department (INDEPENDENT_AMBULATORY_CARE_PROVIDER_SITE_OTHER): Payer: BC Managed Care – PPO

## 2019-12-04 ENCOUNTER — Emergency Department
Admission: EM | Admit: 2019-12-04 | Discharge: 2019-12-04 | Disposition: A | Discharge status: Home / Self Care | Payer: BC Managed Care – PPO | Source: Home / Self Care

## 2019-12-04 DIAGNOSIS — R079 Chest pain, unspecified: Secondary | ICD-10-CM

## 2019-12-04 DIAGNOSIS — I10 Essential (primary) hypertension: Secondary | ICD-10-CM

## 2019-12-04 DIAGNOSIS — R42 Dizziness and giddiness: Secondary | ICD-10-CM

## 2019-12-04 DIAGNOSIS — R0789 Other chest pain: Secondary | ICD-10-CM | POA: Diagnosis not present

## 2019-12-04 LAB — POCT URINALYSIS DIP (MANUAL ENTRY)
Bilirubin, UA: NEGATIVE
Blood, UA: NEGATIVE
Glucose, UA: NEGATIVE mg/dL
Ketones, POC UA: NEGATIVE mg/dL
Leukocytes, UA: NEGATIVE
Nitrite, UA: NEGATIVE
Protein Ur, POC: NEGATIVE mg/dL
Spec Grav, UA: 1.01 (ref 1.010–1.025)
Urobilinogen, UA: 0.2 E.U./dL
pH, UA: 6 (ref 5.0–8.0)

## 2019-12-04 LAB — POCT FASTING CBG KUC MANUAL ENTRY: POCT Glucose (KUC): 167 mg/dL — AB (ref 70–99)

## 2019-12-04 MED ORDER — METOPROLOL SUCCINATE ER 25 MG PO TB24
25.0000 mg | ORAL_TABLET | Freq: Every day | ORAL | 0 refills | Status: DC
Start: 2019-12-04 — End: 2020-01-20

## 2019-12-04 MED ORDER — MECLIZINE HCL 25 MG PO TABS: 25.0000 mg | ORAL_TABLET | Freq: Two times a day (BID) | ORAL | 0 refills | Status: DC | PRN

## 2019-12-04 NOTE — ED Triage Notes (Signed)
Patient c/o feeling dizzy, lightheaded earlier today after bending down under a truck at work. Became nauseated. Resolved. Still c/o dizziness upon standing.

## 2019-12-04 NOTE — ED Provider Notes (Signed)
[facility]    CSN: [id number] Arrival date & time: 12/04/19  1319      History   Chief Complaint Chief Complaint  Patient presents with   Dizziness    HPI Clayton [name] is a 59 y.o. male.   HPI  Patient presents with a complaint of dizziness. He initially was dizzy with standing after working on his truck. He continues to complain of dizziness. Medical history is significant for Hypertension and diabetes.  Laid down under a truck , became sweaty, sick on his stomach and dizziness worsened.  Two nights ago he had an episode of chest pain which awakened him from his sleep. Today , he denies chest pain although endorses some nausea with dizziness episodes. Patient is daily smoker, poorly controlled hypertension, DM II, and BMI >35. No prior cardiac work-up. Past Medical History:  Diagnosis Date   Allergy    Anxiety    Depression    Diabetes mellitus without complication (HCC)    GERD (gastroesophageal reflux disease)    High cholesterol    Hx of adenomatous colonic polyps 02/16/2015   Hypertension    Sleep apnea    wears B-PAP   Testosterone deficiency     Patient Active Problem List   Diagnosis Date Noted   Hx of adenomatous colonic polyps 02/16/2015   Morbid obesity ([place]) 08/08/2014   Type 2 diabetes mellitus ([place]) 05/09/2014   OSA (obstructive sleep apnea) 03/01/2013   Essential hypertension, benign 06/14/2012   Hyperlipidemia associated with type 2 diabetes mellitus ([place]) 06/14/2012   GERD (gastroesophageal reflux disease) 06/14/2012   Depression 06/14/2012    Past Surgical History:  Procedure Laterality Date   ANTERIOR CRUCIATE LIGAMENT REPAIR         Home Medications    Prior to Admission medications   Medication Sig Start Date End Date Taking? Authorizing Provider  aspirin 81 MG EC tablet Take 1 tablet (81 mg total) by mouth daily. Swallow whole. 08/08/14   Cherre Robins, PharmD  escitalopram (LEXAPRO) 10 MG tablet  TAKE 1 TABLET BY MOUTH EVERY DAY 11/18/19   Hassell Done, Mary-Margaret, FNP  fenofibrate (TRICOR) 145 MG tablet Take 1 tablet (145 mg total) by mouth daily. 11/18/19 02/16/20  Chevis Pretty, FNP  glimepiride (AMARYL) 2 MG tablet Take 1 tablet (2 mg total) by mouth daily before breakfast. 11/18/19   Hassell Done, Mary-Margaret, FNP  glucose blood (ONETOUCH VERIO) test strip Use to check BG 1 to 2 times daily Dx: E11.9 05/18/18   Chevis Pretty, FNP  losartan-hydrochlorothiazide (HYZAAR) 100-25 MG tablet TAKE 1 TABLET BY MOUTH ONCE DAILY 11/18/19   Chevis Pretty, FNP  metFORMIN (GLUCOPHAGE) 1000 MG tablet TAKE 1 TABLET BY MOUTH TWICE A DAY WITH FOOD 11/18/19   Chevis Pretty, FNP  [facility] DELICA LANCETS 94T MISC Use to check BG 1 to 2 times daily.  Dx:  E11.9 05/18/18   Hassell Done, Mary-Margaret, FNP  pantoprazole (PROTONIX) 40 MG tablet Take 1 tablet (40 mg total) by mouth daily. 11/18/19   Hassell Done, Mary-Margaret, FNP  rosuvastatin (CRESTOR) 10 MG tablet Take 1 tablet (10 mg total) by mouth daily. 11/18/19   Chevis Pretty, FNP  sildenafil (REVATIO) 20 MG tablet 2 po prn as needed 01/21/19   Chevis Pretty, FNP    Family History Family History  Problem Relation Age of Onset   Hypertension Father    Diabetes Father    Cancer Mother    Diabetes Paternal Grandfather    Colon cancer Neg Hx  Esophageal cancer Neg Hx    Rectal cancer Neg Hx    Stomach cancer Neg Hx    Prostate cancer Neg Hx    Pancreatic cancer Neg Hx     Social History Social History   Tobacco Use   Smoking status: Current Every Day Smoker    Packs/day: 0.50    Years: 35.00    Pack years: 17.50    Types: Cigarettes   Smokeless tobacco: Current User    Types: Snuff   Tobacco comment: I dip here and there  Vaping Use   Vaping Use: Never used  Substance Use Topics   Alcohol use: Yes    Alcohol/week: 0.0 standard drinks    Comment: occ   Drug use: No     Allergies   Patient  has no known allergies.   Review of Systems Review of Systems Pertinent negatives listed in HPI Physical Exam Triage Vital Signs ED Triage Vitals  Enc Vitals Group     BP 12/04/19 1551 (!) 143/82     Pulse Rate 12/04/19 1551 68     Resp 12/04/19 1551 18     Temp 12/04/19 1551 (!) 97.5 F (36.4 C)     Temp Source 12/04/19 1551 Oral     SpO2 12/04/19 1551 97 %     Weight 12/04/19 1556 244 lb (110.7 kg)     Height --      Head Circumference --      Peak Flow --      Pain Score 12/04/19 1556 0     Pain Loc --      Pain Edu? --      Excl. in [place]? --    Orthostatic VS for the past 24 hrs:  BP- Lying Pulse- Lying BP- Sitting Pulse- Sitting BP- Standing at 0 minutes Pulse- Standing at 0 minutes  12/04/19 1604 144/79 57 (!) 148/92 65 132/82 70    Updated Vital Signs BP (!) 143/82 (BP Location: Left Arm)    Pulse 68    Temp (!) 97.5 F (36.4 C) (Oral)    Resp 18    Wt 244 lb (110.7 kg)    SpO2 97%    BMI 35.01 kg/m   Visual Acuity Right Eye Distance:   Left Eye Distance:   Bilateral Distance:    Right Eye Near:   Left Eye Near:    Bilateral Near:     Physical Exam Constitutional:      General: He is not in acute distress.    Appearance: He is obese. He is not diaphoretic.  HENT:     Head: Normocephalic and atraumatic.  Cardiovascular:     Rate and Rhythm: Normal rate and regular rhythm.     Pulses: Normal pulses.  Pulmonary:     Effort: Prolonged expiration present.     Comments: Diminished lung sound bilaterally  Musculoskeletal:     Cervical back: Normal range of motion.  Neurological:     Mental Status: He is alert.      UC Treatments / Results  Labs (all labs ordered are listed, but only abnormal results are displayed) Labs Reviewed  POCT FASTING CBG [facility] MANUAL ENTRY - Abnormal; Notable for the following components:      Result Value   POCT Glucose ([facility]) 167 (*)    All other components within normal limits    EKG   Radiology DG Chest 2  View  Result Date: 12/04/2019 CLINICAL DATA:  Left chest pain EXAM: CHEST -  2 VIEW COMPARISON:  12/01/2014 FINDINGS: The heart size and mediastinal contours are within normal limits. Both lungs are clear. The visualized skeletal structures are unremarkable. IMPRESSION: Normal study. Electronically Signed   By: Rolm Baptise M.D.   On: 12/04/2019 16:56    Procedures Procedures (including critical care time)  Medications Ordered in UC Medications - No data to display  Initial Impression / Assessment and Plan / UC Course  I have reviewed the triage vital signs and the nursing notes.  Pertinent labs & imaging results that were available during my care of the patient were reviewed by me and considered in my medical decision making (see chart for details).    ECG, NSR no ST changes. Chest x-ray negative. Given symptoms, poorly controlled hypertension, and  cardiac risk factors, will refer to cardiology for a full cardiac work-up to rule out CAD as the source of symptoms. Will start patient on BB per orders to improve BP control and improve cardiac functioning.  Patient given clear instructions to follow-up with cardiology as recommended. Also advised if chest pain  develops  to go immediately to the emergency department. Patient verbalized understanding and agreement with plan. Final Clinical Impressions(s) / UC Diagnoses   Final diagnoses:  Chest pain, left  Dizziness  Elevated heart rate with elevated blood pressure and diagnosis of hypertension     Discharge Instructions     I have placed a referral for you to follow-up with the cardiology department at [facility] given your symptoms over the last couple days.  Your chest x-ray and your EKG here are both reassuring however there are some other cardiac causes that could be related to the symptoms you have had on and off of the last 3 days.  I have also prescribed you meclizine 25 mg 2 times a day as needed for the dizziness and  that will also help with any type of a sick feeling in your stomach that occurs with the dizziness.  Your blood pressure is very elevated today and I do suspect that this could be attributing to some of your symptoms I would like to start you on metoprolol which she would take in addition to your other 2 medications to help reduce your blood pressure and increase the strength of your heart pumping by decreasing your blood pressure. If chest pain recurs and continues to remain present for greater than 10 minutes and feels as though there is a pressure or heaviness in your chest I would like for you to call 911 immediately and go to the emergency department as these are signs of a heart attack.    ED Prescriptions    None     PDMP not reviewed this encounter.   Scot Jun, FNP 12/10/19 1825

## 2019-12-04 NOTE — Discharge Instructions (Addendum)
I have placed a referral for you to follow-up with the cardiology department at [facility] given your symptoms over the last couple days.  Your chest x-ray and your EKG here are both reassuring however there are some other cardiac causes that could be related to the symptoms you have had on and off of the last 3 days.  I have also prescribed you meclizine 25 mg 2 times a day as needed for the dizziness and that will also help with any type of a sick feeling in your stomach that occurs with the dizziness.  Your blood pressure is very elevated today and I do suspect that this could be attributing to some of your symptoms I would like to start you on metoprolol which she would take in addition to your other 2 medications to help reduce your blood pressure and increase the strength of your heart pumping by decreasing your blood pressure. If chest pain recurs and continues to remain present for greater than 10 minutes and feels as though there is a pressure or heaviness in your chest I would like for you to call 911 immediately and go to the emergency department as these are signs of a heart attack.

## 2019-12-26 ENCOUNTER — Other Ambulatory Visit: Payer: Self-pay | Admitting: Family Medicine

## 2019-12-26 DIAGNOSIS — J309 Allergic rhinitis, unspecified: Secondary | ICD-10-CM | POA: Insufficient documentation

## 2019-12-26 DIAGNOSIS — I10 Essential (primary) hypertension: Secondary | ICD-10-CM | POA: Insufficient documentation

## 2019-12-26 DIAGNOSIS — F419 Anxiety disorder, unspecified: Secondary | ICD-10-CM | POA: Insufficient documentation

## 2019-12-26 DIAGNOSIS — G4730 Sleep apnea, unspecified: Secondary | ICD-10-CM | POA: Insufficient documentation

## 2019-12-26 DIAGNOSIS — E7800 Pure hypercholesterolemia, unspecified: Secondary | ICD-10-CM | POA: Insufficient documentation

## 2019-12-26 DIAGNOSIS — T7840XA Allergy, unspecified, initial encounter: Secondary | ICD-10-CM | POA: Insufficient documentation

## 2019-12-26 DIAGNOSIS — E119 Type 2 diabetes mellitus without complications: Secondary | ICD-10-CM | POA: Insufficient documentation

## 2019-12-26 DIAGNOSIS — E349 Endocrine disorder, unspecified: Secondary | ICD-10-CM | POA: Insufficient documentation

## 2019-12-27 ENCOUNTER — Ambulatory Visit: Payer: BC Managed Care – PPO | Admitting: Cardiology

## 2020-01-16 ENCOUNTER — Ambulatory Visit: Payer: BC Managed Care – PPO | Admitting: Cardiology

## 2020-01-20 ENCOUNTER — Ambulatory Visit (INDEPENDENT_AMBULATORY_CARE_PROVIDER_SITE_OTHER): Payer: BC Managed Care – PPO

## 2020-01-20 ENCOUNTER — Other Ambulatory Visit: Payer: Self-pay

## 2020-01-20 ENCOUNTER — Ambulatory Visit: Payer: BC Managed Care – PPO | Admitting: Cardiology

## 2020-01-20 ENCOUNTER — Encounter: Payer: Self-pay | Admitting: *Deleted

## 2020-01-20 VITALS — BP 120/80 | HR 66 | Ht 70.0 in | Wt 249.0 lb

## 2020-01-20 DIAGNOSIS — E669 Obesity, unspecified: Secondary | ICD-10-CM

## 2020-01-20 DIAGNOSIS — E119 Type 2 diabetes mellitus without complications: Secondary | ICD-10-CM

## 2020-01-20 DIAGNOSIS — I10 Essential (primary) hypertension: Secondary | ICD-10-CM | POA: Diagnosis not present

## 2020-01-20 DIAGNOSIS — R079 Chest pain, unspecified: Secondary | ICD-10-CM | POA: Diagnosis not present

## 2020-01-20 DIAGNOSIS — R42 Dizziness and giddiness: Secondary | ICD-10-CM | POA: Diagnosis not present

## 2020-01-20 DIAGNOSIS — E785 Hyperlipidemia, unspecified: Secondary | ICD-10-CM

## 2020-01-20 DIAGNOSIS — G4733 Obstructive sleep apnea (adult) (pediatric): Secondary | ICD-10-CM

## 2020-01-20 DIAGNOSIS — E1169 Type 2 diabetes mellitus with other specified complication: Secondary | ICD-10-CM

## 2020-01-20 DIAGNOSIS — Z720 Tobacco use: Secondary | ICD-10-CM

## 2020-01-20 MED ORDER — METOPROLOL TARTRATE 100 MG PO TABS: 100.0000 mg | ORAL_TABLET | Freq: Once | ORAL | 0 refills | Status: DC

## 2020-01-20 MED ORDER — NITROGLYCERIN 0.4 MG SL SUBL: 0.4000 mg | SUBLINGUAL_TABLET | SUBLINGUAL | 11 refills | Status: AC | PRN

## 2020-01-20 NOTE — Progress Notes (Signed)
Cardiology Office Note:    Date:  01/20/2020   ID:  Clayton [name], DOB 09/28/1960, MRN [id number]  PCP:  Chevis Pretty, FNP  Cardiologist:  Berniece Salines, DO  Electrophysiologist:  None   Referring MD: Scot Jun, FNP   Chief Complaint  Patient presents with  . Chest Pain  . Dizziness  . Fatigue    History of Present Illness:    Clayton [name] is a 59 y.o. male with a hx of diabetes mellitus type 2, hypertension, hyperlipidemia, obesity, is current smoker, OSA on CPAP, obesity, premature family history of coronary artery disease.  The patient was referred by his primary care provider to be evaluated for intermittent chest pain.  He tells me that starting September 1 he notices first midsternal chest pressure/tightness sensation which lasted for less than 5 minutes and resolved.  2 days later he started to feel dizzy.  With this dizziness he noticed that head movement made it worse.  He was started on meclizine which does not seem to be helping.  In the meantime his chest pain episodes has become a little more frequent in the last week he has had 2 episodes which he describes as a mid chest tightness sensation.  There is some radiation to his right arm.  But not always.  He quantified the pain at least a 7 out of 10.  He admits to associated shortness of breath and fatigue.  He is concerned.  Past Medical History:  Diagnosis Date  . Allergy   . Anxiety   . Depression   . Essential hypertension, benign 06/14/2012  . GERD (gastroesophageal reflux disease)   . High cholesterol   . Hx of adenomatous colonic polyps 02/16/2015  . Hyperlipidemia associated with type 2 diabetes mellitus ([place]) 06/14/2012  . Hypertension   . Morbid obesity ([employer]) 08/08/2014  . OSA (obstructive sleep apnea) 03/01/2013  . Sleep apnea    wears B-PAP  . Testosterone deficiency   . Type 2 diabetes mellitus ([place]) 05/09/2014    Past Surgical History:  Procedure Laterality Date  . ANTERIOR  CRUCIATE LIGAMENT REPAIR Left 2012   Knee    Current Medications: Current Meds  Medication Sig  . aspirin 81 MG EC tablet Take 1 tablet (81 mg total) by mouth daily. Swallow whole.  . escitalopram (LEXAPRO) 10 MG tablet TAKE 1 TABLET BY MOUTH EVERY DAY  . fenofibrate (TRICOR) 145 MG tablet Take 1 tablet (145 mg total) by mouth daily.  Marland Kitchen glimepiride (AMARYL) 2 MG tablet Take 1 tablet (2 mg total) by mouth daily before breakfast.  . glucose blood (ONETOUCH VERIO) test strip Use to check BG 1 to 2 times daily Dx: E11.9  . losartan-hydrochlorothiazide (HYZAAR) 100-25 MG tablet TAKE 1 TABLET BY MOUTH ONCE DAILY  . metFORMIN (GLUCOPHAGE) 1000 MG tablet TAKE 1 TABLET BY MOUTH TWICE A DAY WITH FOOD  . ONETOUCH DELICA LANCETS 14N MISC Use to check BG 1 to 2 times daily.  Dx:  E11.9  . pantoprazole (PROTONIX) 40 MG tablet Take 1 tablet (40 mg total) by mouth daily.  . rosuvastatin (CRESTOR) 10 MG tablet Take 1 tablet (10 mg total) by mouth daily.     Allergies:   Patient has no known allergies.   Social History   Socioeconomic History  . Marital status: Married    Spouse name: Not on file  . Number of children: Not on file  . Years of education: Not on file  . Highest education level:  Not on file  Occupational History  . Not on file  Tobacco Use  . Smoking status: Current Every Day Smoker    Packs/day: 0.50    Years: 35.00    Pack years: 17.50    Types: Cigarettes  . Smokeless tobacco: Current User    Types: Snuff  . Tobacco comment: I dip here and there  Vaping Use  . Vaping Use: Never used  Substance and Sexual Activity  . Alcohol use: Yes    Alcohol/week: 0.0 standard drinks    Comment: occ  . Drug use: No  . Sexual activity: Not on file  Other Topics Concern  . Not on file  Social History Narrative  . Not on file   Social Determinants of Health   Financial Resource Strain:   . Difficulty of Paying Living Expenses: Not on file  Food Insecurity:   . Worried About  Charity fundraiser in the Last Year: Not on file  . Ran Out of Food in the Last Year: Not on file  Transportation Needs:   . Lack of Transportation (Medical): Not on file  . Lack of Transportation (Non-Medical): Not on file  Physical Activity:   . Days of Exercise per Week: Not on file  . Minutes of Exercise per Session: Not on file  Stress:   . Feeling of Stress : Not on file  Social Connections:   . Frequency of Communication with Friends and Family: Not on file  . Frequency of Social Gatherings with Friends and Family: Not on file  . Attends Religious Services: Not on file  . Active Member of Clubs or Organizations: Not on file  . Attends Archivist Meetings: Not on file  . Marital Status: Not on file     Family History: The patient's family history includes Breast cancer in his mother; Diabetes in his father and paternal grandfather; Heart attack in his mother; Hypertension in his father. There is no history of Colon cancer, Esophageal cancer, Rectal cancer, Stomach cancer, Prostate cancer, or Pancreatic cancer.  ROS:   Review of Systems  Constitution: Reports dizziness fatigue.  Negative for decreased appetite, fever and weight gain.  HENT: Negative for congestion, ear discharge, hoarse voice and sore throat.   Eyes: Negative for discharge, redness, vision loss in right eye and visual halos.  Cardiovascular: Reports chest pain, dyspnea on exertion.  Negative for, leg swelling, orthopnea and palpitations.  Respiratory: Negative for cough, hemoptysis, shortness of breath and snoring.   Endocrine: Negative for heat intolerance and polyphagia.  Hematologic/Lymphatic: Negative for bleeding problem. Does not bruise/bleed easily.  Skin: Negative for flushing, nail changes, rash and suspicious lesions.  Musculoskeletal: Negative for arthritis, joint pain, muscle cramps, myalgias, neck pain and stiffness.  Gastrointestinal: Negative for abdominal pain, bowel incontinence,  diarrhea and excessive appetite.  Genitourinary: Negative for decreased libido, genital sores and incomplete emptying.  Neurological: Negative for brief paralysis, focal weakness, headaches and loss of balance.  Psychiatric/Behavioral: Negative for altered mental status, depression and suicidal ideas.  Allergic/Immunologic: Negative for HIV exposure and persistent infections.    EKGs/Labs/Other Studies Reviewed:    The following studies were reviewed today:   EKG: EKG done on December 04, 2019 demonstrates sinus rhythm, heart rate 62 bpm.  Recent Labs: 11/18/2019: ALT 15; BUN 24; Creatinine, Ser 0.85; Hemoglobin 14.4; Platelets 253; Potassium 4.1; Sodium 137  Recent Lipid Panel    Component Value Date/Time   CHOL 123 11/18/2019 0848   CHOL 139 06/15/2012 1640  TRIG 161 (H) 11/18/2019 0848   TRIG 248 (H) 05/09/2014 1325   TRIG 197 (H) 06/15/2012 1640   HDL 25 (L) 11/18/2019 0848   HDL 31 (L) 05/09/2014 1325   HDL 28 (L) 06/15/2012 1640   CHOLHDL 4.9 11/18/2019 0848   LDLCALC 70 11/18/2019 0848   LDLCALC NOTES: 11/16/2012 1550   LDLCALC 72 06/15/2012 1640   LDLDIRECT 70 09/24/2014 0823    Physical Exam:    VS:  BP 120/80 (BP Location: Right Arm, Patient Position: Sitting, Cuff Size: Normal)   Pulse 66   Ht _0  (1.778 m)   Wt 249 lb (112.9 kg)   SpO2 96%   BMI 35.73 kg/m     Wt Readings from Last 3 Encounters:  01/20/20 249 lb (112.9 kg)  12/04/19 244 lb (110.7 kg)  11/18/19 243 lb (110.2 kg)     GEN: Well nourished, well developed in no acute distress HEENT: Normal NECK: No JVD; No carotid bruits LYMPHATICS: No lymphadenopathy CARDIAC: S1S2 noted,RRR, 2/6 holosystolic murmurs, rubs, gallops RESPIRATORY:  Clear to auscultation without rales, wheezing or rhonchi  ABDOMEN: Soft, non-tender, non-distended, +bowel sounds, no guarding. EXTREMITIES: No edema, No cyanosis, no clubbing MUSCULOSKELETAL:  No deformity  SKIN: Warm and dry NEUROLOGIC:  Alert and  oriented x 3, non-focal PSYCHIATRIC:  Normal affect, good insight  ASSESSMENT:    1. Chest pain of uncertain etiology   2. Dizziness   3. Essential hypertension, benign   4. OSA (obstructive sleep apnea)   5. Hyperlipidemia associated with type 2 diabetes mellitus ([place])   6. Diabetes mellitus without complication (HCC)   7. Obesity (BMI 30-39.9)   8. Tobacco use    PLAN:     1.  His chest pain is concerning.  He has significant risk factors for coronary artery disease (diabetes type 2, hypertension, family history of premature coronary artery disease in his father in his early 55s, hypertension, hyperlipidemia and obesity).  I discussed with the patient to pursue an ischemic evaluation.  We have talked about a coronary CTA and he is agreeable for this.  He has no IV contrast dye allergy.  We will proceed with this testing. Sublingual nitroglycerin prescription was sent, its protocol and 911 protocol explained and the patient vocalized understanding questions were answered to the patient's satisfaction.  He did have sildenafil on his medication list but the patient tells me he does not take this medication at all he took it prior as needed.  I still did educate the patient that he cannot take nitroglycerin nearly 72 hours if he has had sildenafil or vice versa.  He expresses understanding.  In terms of his heart murmur and his symptoms of shortness of breath I like to get an echocardiogram to assess LV/RV function and any other structural abnormalities.  The patient was counseled on tobacco cessation today for 5 minutes.  Counseling included reviewing the risks of smoking tobacco products, how it impacts the patient's current medical diagnoses and different strategies for quitting.  Pharmacotherapy to aid in tobacco cessation was not prescribed today. The patient coordinate with  primary care provider.  The patient was also advised to call   1-800-QUIT-NOW 941-063-1777) for additional help  with quitting smoking.  A 3-day ZIO monitor will be performed to make sure that cardiac arrhythmia is not contributing to his dizziness.  Continue with use of statin for his dyslipidemia.  The patient understands the need to lose weight with diet and exercise. We have discussed specific  strategies for this.  The patient is in agreement with the above plan. The patient left the office in stable condition.  The patient will follow up in   Medication Adjustments/Labs and Tests Ordered: Current medicines are reviewed at length with the patient today.  Concerns regarding medicines are outlined above.  Orders Placed This Encounter  Procedures  . CT CORONARY MORPH W/CTA COR W/SCORE W/CA W/CM &/OR WO/CM  . CT CORONARY FRACTIONAL FLOW RESERVE DATA PREP  . CT CORONARY FRACTIONAL FLOW RESERVE FLUID ANALYSIS  . Basic metabolic panel  . LONG TERM MONITOR (3-14 DAYS)  . ECHOCARDIOGRAM COMPLETE   Meds ordered this encounter  Medications  . nitroGLYCERIN (NITROSTAT) 0.4 MG SL tablet    Sig: Place 1 tablet (0.4 mg total) under the tongue every 5 (five) minutes as needed for chest pain.    Dispense:  25 tablet    Refill:  11  . DISCONTD: metoprolol tartrate (LOPRESSOR) 100 MG tablet    Sig: Take 1 tablet (100 mg total) by mouth once for 1 dose. 2 hours before ct.    Dispense:  1 tablet    Refill:  0  . metoprolol tartrate (LOPRESSOR) 100 MG tablet    Sig: Take 1 tablet (100 mg total) by mouth once for 1 dose. 2 hours before ct.    Dispense:  1 tablet    Refill:  0    Patient Instructions  Medication Instructions:  Your physician has recommended you make the following change in your medication:   TAKE AS NEEDED FOR CHEST PAIN: Nitroglycerin 0.4 mg sublingual (under your tongue) as needed for chest pain. If experiencing chest pain, stop what you are doing and sit down. Take 1 nitroglycerin and wait 5 minutes. If chest pain continues, take another nitroglycerin and wait 5 minutes. If chest pain  does not subside, take 1 more nitroglycerin and dial 911. You make take a total of 3 nitroglycerin in a 15 minute time frame.   *If you need a refill on your cardiac medications before your next appointment, please call your pharmacy*   Lab Work: Your physician recommends that you return for lab work 3-7days before ct: bmp   If you have labs (blood work) drawn today and your tests are completely normal, you will receive your results only by: Marland Kitchen MyChart Message (if you have MyChart) OR . A paper copy in the mail If you have any lab test that is abnormal or we need to change your treatment, we will call you to review the results.   Testing/Procedures: A zio monitor was ordered today. It will remain on for 3 days. You will then return monitor and event diary in provided box. It takes 1-2 weeks for report to be downloaded and returned to [place]. We will call you with the results. If monitor falls off or has orange flashing light, please call Zio for further instructions.    Your physician has requested that you have an echocardiogram. Echocardiography is a painless test that uses sound waves to create images of your heart. It provides your doctor with information about the size and shape of your heart and how well your heart's chambers and valves are working. This procedure takes approximately one hour. There are no restrictions for this procedure.  Your cardiac CT will be scheduled at one of the below locations:   [facility] [address] [place], [place] [address] [phone]  [place] [address]  [place], [place] [address] [phone]  If scheduled at [facility], please arrive at the [facility] main entrance of [facility] 30 minutes prior to test start time. Proceed to the [facility] Radiology Department (first floor) to check-in and test prep.  If scheduled at [facility], please arrive 15 mins early for check-in and test prep.  Please follow these instructions carefully (unless otherwise directed):  Hold all erectile dysfunction medications at least 3 days (72 hrs) prior to test.  On the Night Before the Test: . Be sure to Drink plenty of water. . Do not consume any caffeinated/decaffeinated beverages or chocolate 12 hours prior to your test. . Do not take any antihistamines 12 hours prior to your test.  On the Day of the Test: . Drink plenty of water. Do not drink any water within one hour of the test. . Do not eat any food 4 hours prior to the test. . You may take your regular medications prior to the test.  . Take metoprolol (Lopressor) two hours prior to test. . HOLD losartan-Hydrochlorothiazide morning of the test. . Hold glimepiride the morning of the test  . Hold metformin the morning of the test.           After the Test: . Drink plenty of water. . After receiving IV contrast, you may experience a mild flushed feeling. This is normal. . On occasion, you may experience a mild rash up to 24 hours after the test. This is not dangerous. If this occurs, you can take Benadryl 25 mg and increase your fluid intake. . If you experience trouble breathing, this can be serious. If it is severe call 911 IMMEDIATELY. If it is mild, please call our office. . If you take any of these medications: Glipizide/Metformin, Avandament, Glucavance, please do not take 48 hours after completing test unless otherwise instructed.   Once we have confirmed authorization from your insurance company, we will call you to set up a date and time for your test. Based on how quickly your insurance processes prior authorizations requests, please allow up to 4 weeks to be contacted for scheduling your Cardiac CT appointment. Be advised that routine Cardiac CT appointments could be scheduled as many as 8 weeks after your provider has ordered it.  For non-scheduling related  questions, please contact the cardiac imaging nurse navigator should you have any questions/concerns: Marchia Bond, Cardiac Imaging Nurse Navigator Burley Saver, Interim Cardiac Imaging Nurse [facility] Direct Office Dial: [phone]   For scheduling needs, including cancellations and rescheduling, please call Vivien Rota at [phone], option [phone].      Follow-Up: At [facility], you and your health needs are our priority.  As part of our continuing mission to provide you with exceptional heart care, we have created designated Provider Care Teams.  These Care Teams include your primary Cardiologist (physician) and Advanced Practice Providers (APPs -  Physician Assistants and Nurse Practitioners) who all work together to provide you with the care you need, when you need it.  We recommend signing up for the patient portal called "MyChart".  Sign up information is provided on this After Visit Summary.  MyChart is used to connect with patients for Virtual Visits (Telemedicine).  Patients are able to view lab/test results, encounter notes, upcoming appointments, etc.  Non-urgent messages can be sent to your provider as well.   To learn more about what you can do with MyChart,  go to [email or web address].    Your next appointment:   12 week(s)  The format for your next appointment:   In Person  Provider:   Berniece Salines, DO   Other Instructions   Echocardiogram An echocardiogram is a procedure that uses painless sound waves (ultrasound) to produce an image of the heart. Images from an echocardiogram can provide important information about:  Signs of coronary artery disease (CAD).  Aneurysm detection. An aneurysm is a weak or damaged part of an artery wall that bulges out from the normal force of blood pumping through the body.  Heart size and shape. Changes in the size or shape of the heart can be associated with certain conditions, including heart  failure, aneurysm, and CAD.  Heart muscle function.  Heart valve function.  Signs of a past heart attack.  Fluid buildup around the heart.  Thickening of the heart muscle.  A tumor or infectious growth around the heart valves. Tell a health care provider about:  Any allergies you have.  All medicines you are taking, including vitamins, herbs, eye drops, creams, and over-the-counter medicines.  Any blood disorders you have.  Any surgeries you have had.  Any medical conditions you have.  Whether you are pregnant or may be pregnant. What are the risks? Generally, this is a safe procedure. However, problems may occur, including:  Allergic reaction to dye (contrast) that may be used during the procedure. What happens before the procedure? No specific preparation is needed. You may eat and drink normally. What happens during the procedure?   An IV tube may be inserted into one of your veins.  You may receive contrast through this tube. A contrast is an injection that improves the quality of the pictures from your heart.  A gel will be applied to your chest.  A wand-like tool (transducer) will be moved over your chest. The gel will help to transmit the sound waves from the transducer.  The sound waves will harmlessly bounce off of your heart to allow the heart images to be captured in real-time motion. The images will be recorded on a computer. The procedure may vary among health care providers and hospitals. What happens after the procedure?  You may return to your normal, everyday life, including diet, activities, and medicines, unless your health care provider tells you not to do that. Summary  An echocardiogram is a procedure that uses painless sound waves (ultrasound) to produce an image of the heart.  Images from an echocardiogram can provide important information about the size and shape of your heart, heart muscle function, heart valve function, and fluid buildup  around your heart.  You do not need to do anything to prepare before this procedure. You may eat and drink normally.  After the echocardiogram is completed, you may return to your normal, everyday life, unless your health care provider tells you not to do that. This information is not intended to replace advice given to you by your health care provider. Make sure you discuss any questions you have with your health care provider. Document Revised: 06/28/2018 Document Reviewed: 04/09/2016 Elsevier Patient Education  [place].  Nitroglycerin sublingual tablets What is this medicine? NITROGLYCERIN (nye troe GLI ser in) is a type of vasodilator. It relaxes blood vessels, increasing the blood and oxygen supply to your heart. This medicine is used to relieve chest pain caused by angina. It is also used to prevent chest pain before activities like climbing stairs, going outdoors in cold  weather, or sexual activity. This medicine may be used for other purposes; ask your health care provider or pharmacist if you have questions. COMMON BRAND NAME(S): Nitroquick, Nitrostat, Nitrotab What should I tell my health care provider before I take this medicine? They need to know if you have any of these conditions:  anemia  head injury, recent stroke, or bleeding in the brain  liver disease  previous heart attack  an unusual or allergic reaction to nitroglycerin, other medicines, foods, dyes, or preservatives  pregnant or trying to get pregnant  breast-feeding How should I use this medicine? Take this medicine by mouth as needed. At the first sign of an angina attack (chest pain or tightness) place one tablet under your tongue. You can also take this medicine 5 to 10 minutes before an event likely to produce chest pain. Follow the directions on the prescription label. Let the tablet dissolve under the tongue. Do not swallow whole. Replace the dose if you accidentally swallow it. It will help if  your mouth is not dry. Saliva around the tablet will help it to dissolve more quickly. Do not eat or drink, smoke or chew tobacco while a tablet is dissolving. If you are not better within 5 minutes after taking ONE dose of nitroglycerin, call 9-1-1 immediately to seek emergency medical care. Do not take more than 3 nitroglycerin tablets over 15 minutes. If you take this medicine often to relieve symptoms of angina, your doctor or health care professional may provide you with different instructions to manage your symptoms. If symptoms do not go away after following these instructions, it is important to call 9-1-1 immediately. Do not take more than 3 nitroglycerin tablets over 15 minutes. Talk to your pediatrician regarding the use of this medicine in children. Special care may be needed. Overdosage: If you think you have taken too much of this medicine contact a poison control center or emergency room at once. NOTE: This medicine is only for you. Do not share this medicine with others. What if I miss a dose? This does not apply. This medicine is only used as needed. What may interact with this medicine? Do not take this medicine with any of the following medications:  certain migraine medicines like ergotamine and dihydroergotamine (DHE)  medicines used to treat erectile dysfunction like sildenafil, tadalafil, and vardenafil  riociguat This medicine may also interact with the following medications:  alteplase  aspirin  heparin  medicines for high blood pressure  medicines for mental depression  other medicines used to treat angina  phenothiazines like chlorpromazine, mesoridazine, prochlorperazine, thioridazine This list may not describe all possible interactions. Give your health care provider a list of all the medicines, herbs, non-prescription drugs, or dietary supplements you use. Also tell them if you smoke, drink alcohol, or use illegal drugs. Some items may interact with your  medicine. What should I watch for while using this medicine? Tell your doctor or health care professional if you feel your medicine is no longer working. Keep this medicine with you at all times. Sit or lie down when you take your medicine to prevent falling if you feel dizzy or faint after using it. Try to remain calm. This will help you to feel better faster. If you feel dizzy, take several deep breaths and lie down with your feet propped up, or bend forward with your head resting between your knees. You may get drowsy or dizzy. Do not drive, use machinery, or do anything that needs mental alertness  until you know how this drug affects you. Do not stand or sit up quickly, especially if you are an older patient. This reduces the risk of dizzy or fainting spells. Alcohol can make you more drowsy and dizzy. Avoid alcoholic drinks. Do not treat yourself for coughs, colds, or pain while you are taking this medicine without asking your doctor or health care professional for advice. Some ingredients may increase your blood pressure. What side effects may I notice from receiving this medicine? Side effects that you should report to your doctor or health care professional as soon as possible:  blurred vision  dry mouth  skin rash  sweating  the feeling of extreme pressure in the head  unusually weak or tired Side effects that usually do not require medical attention (report to your doctor or health care professional if they continue or are bothersome):  flushing of the face or neck  headache  irregular heartbeat, palpitations  nausea, vomiting This list may not describe all possible side effects. Call your doctor for medical advice about side effects. You may report side effects to FDA at 1-800-FDA-1088. Where should I keep my medicine? Keep out of the reach of children. Store at room temperature between 20 and 25 degrees C (68 and 77 degrees F). Store in [facility] [employer]. Protect from  light and moisture. Keep tightly closed. Throw away any unused medicine after the expiration date. NOTE: This sheet is a summary. It may not cover all possible information. If you have questions about this medicine, talk to your doctor, pharmacist, or health care provider.  2020 Elsevier/Gold Standard (2013-01-03 17:57:36)   Cardiac CT Angiogram A cardiac CT angiogram is a procedure to look at the heart and the area around the heart. It may be done to help find the cause of chest pains or other symptoms of heart disease. During this procedure, a substance called contrast dye is injected into the blood vessels in the area to be checked. A large X-ray machine, called a CT scanner, then takes detailed pictures of the heart and the surrounding area. The procedure is also sometimes called a coronary CT angiogram, coronary artery scanning, or CTA. A cardiac CT angiogram allows the health care provider to see how well blood is flowing to and from the heart. The health care provider will be able to see if there are any problems, such as:  Blockage or narrowing of the coronary arteries in the heart.  Fluid around the heart.  Signs of weakness or disease in the muscles, valves, and tissues of the heart. Tell a health care provider about:  Any allergies you have. This is especially important if you have had a previous allergic reaction to contrast dye.  All medicines you are taking, including vitamins, herbs, eye drops, creams, and over-the-counter medicines.  Any blood disorders you have.  Any surgeries you have had.  Any medical conditions you have.  Whether you are pregnant or may be pregnant.  Any anxiety disorders, chronic pain, or other conditions you have that may increase your stress or prevent you from lying still. What are the risks? Generally, this is a safe procedure. However, problems may occur, including:  Bleeding.  Infection.  Allergic reactions to medicines or  dyes.  Damage to other structures or organs.  Kidney damage from the contrast dye that is used.  Increased risk of cancer from radiation exposure. This risk is low. Talk with your health care provider about: ? The risks and benefits of testing. ?  How you can receive the lowest dose of radiation. What happens before the procedure?  Wear comfortable clothing and remove any jewelry, glasses, dentures, and hearing aids.  Follow instructions from your health care provider about eating and drinking. This may include: ? For 12 hours before the procedure -- avoid caffeine. This includes tea, coffee, soda, energy drinks, and diet pills. Drink plenty of water or other fluids that do not have caffeine in them. Being well hydrated can prevent complications. ? For 4-6 hours before the procedure -- stop eating and drinking. The contrast dye can cause nausea, but this is less likely if your stomach is empty.  Ask your health care provider about changing or stopping your regular medicines. This is especially important if you are taking diabetes medicines, blood thinners, or medicines to treat problems with erections (erectile dysfunction). What happens during the procedure?   Hair on your chest may need to be removed so that small sticky patches called electrodes can be placed on your chest. These will transmit information that helps to monitor your heart during the procedure.  An IV will be inserted into one of your veins.  You might be given a medicine to control your heart rate during the procedure. This will help to ensure that good images are obtained.  You will be asked to lie on an exam table. This table will slide in and out of the CT machine during the procedure.  Contrast dye will be injected into the IV. You might feel warm, or you may get a metallic taste in your mouth.  You will be given a medicine called nitroglycerin. This will relax or dilate the arteries in your heart.  The table  that you are lying on will move into the CT machine tunnel for the scan.  The person running the machine will give you instructions while the scans are being done. You may be asked to: ? Keep your arms above your head. ? Hold your breath. ? Stay very still, even if the table is moving.  When the scanning is complete, you will be moved out of the machine.  The IV will be removed. The procedure may vary among health care providers and hospitals. What can I expect after the procedure? After your procedure, it is common to have:  A metallic taste in your mouth from the contrast dye.  A feeling of warmth.  A headache from the nitroglycerin. Follow these instructions at home:  Take over-the-counter and prescription medicines only as told by your health care provider.  If you are told, drink enough fluid to keep your urine pale yellow. This will help to flush the contrast dye out of your body.  Most people can return to their normal activities right after the procedure. Ask your health care provider what activities are safe for you.  It is up to you to get the results of your procedure. Ask your health care provider, or the department that is doing the procedure, when your results will be ready.  Keep all follow-up visits as told by your health care provider. This is important. Contact a health care provider if:  You have any symptoms of allergy to the contrast dye. These include: ? Shortness of breath. ? Rash or hives. ? A racing heartbeat. Summary  A cardiac CT angiogram is a procedure to look at the heart and the area around the heart. It may be done to help find the cause of chest pains or other symptoms of  heart disease.  During this procedure, a large X-ray machine, called a CT scanner, takes detailed pictures of the heart and the surrounding area after a contrast dye has been injected into blood vessels in the area.  Ask your health care provider about changing or stopping  your regular medicines before the procedure. This is especially important if you are taking diabetes medicines, blood thinners, or medicines to treat erectile dysfunction.  If you are told, drink enough fluid to keep your urine pale yellow. This will help to flush the contrast dye out of your body. This information is not intended to replace advice given to you by your health care provider. Make sure you discuss any questions you have with your health care provider. Document Revised: 10/31/2018 Document Reviewed: 10/31/2018 Elsevier Patient Education  [place].     Adopting a Healthy Lifestyle.  Know what a healthy weight is for you (roughly BMI <25) and aim to maintain this   Aim for 7+ servings of fruits and vegetables daily   65-80+ fluid ounces of water or unsweet tea for healthy kidneys   Limit to max 1 drink of alcohol per day; avoid smoking/tobacco   Limit animal fats in diet for cholesterol and heart health - choose grass fed whenever available   Avoid highly processed foods, and foods high in saturated/trans fats   Aim for low stress - take time to unwind and care for your mental health   Aim for 150 min of moderate intensity exercise weekly for heart health, and weights twice weekly for bone health   Aim for 7-9 hours of sleep daily   When it comes to diets, agreement about the perfect plan isnt easy to find, even among the experts. Experts at the [employer] developed an idea known as the Healthy Eating Plate. Just imagine a plate divided into logical, healthy portions.   The emphasis is on diet quality:   Load up on vegetables and fruits - one-half of your plate: Aim for color and variety, and remember that potatoes dont count.   Go for whole grains - one-quarter of your plate: Whole wheat, barley, wheat berries, quinoa, oats, brown rice, and foods made with them. If you want pasta, go with whole wheat pasta.   Protein power -  one-quarter of your plate: Fish, chicken, beans, and nuts are all healthy, versatile protein sources. Limit red meat.   The diet, however, does go beyond the plate, offering a few other suggestions.   Use healthy plant oils, such as olive, canola, soy, corn, sunflower and peanut. Check the labels, and avoid partially hydrogenated oil, which have unhealthy trans fats.   If youre thirsty, drink water. Coffee and tea are good in moderation, but skip sugary drinks and limit milk and dairy products to one or two daily servings.   The type of carbohydrate in the diet is more important than the amount. Some sources of carbohydrates, such as vegetables, fruits, whole grains, and beans-are healthier than others.   Finally, stay active  Signed, Berniece Salines, DO  01/20/2020 11:03 AM    [place]

## 2020-01-20 NOTE — Patient Instructions (Addendum)
Medication Instructions:  Your physician has recommended you make the following change in your medication:   TAKE AS NEEDED FOR CHEST PAIN: Nitroglycerin 0.4 mg sublingual (under your tongue) as needed for chest pain. If experiencing chest pain, stop what you are doing and sit down. Take 1 nitroglycerin and wait 5 minutes. If chest pain continues, take another nitroglycerin and wait 5 minutes. If chest pain does not subside, take 1 more nitroglycerin and dial 911. You make take a total of 3 nitroglycerin in a 15 minute time frame.   *If you need a refill on your cardiac medications before your next appointment, please call your pharmacy*   Lab Work: Your physician recommends that you return for lab work 3-7days before ct: bmp   If you have labs (blood work) drawn today and your tests are completely normal, you will receive your results only by: Marland Kitchen MyChart Message (if you have MyChart) OR . A paper copy in the mail If you have any lab test that is abnormal or we need to change your treatment, we will call you to review the results.   Testing/Procedures: A zio monitor was ordered today. It will remain on for 3 days. You will then return monitor and event diary in provided box. It takes 1-2 weeks for report to be downloaded and returned to [place]. We will call you with the results. If monitor falls off or has orange flashing light, please call Zio for further instructions.    Your physician has requested that you have an echocardiogram. Echocardiography is a painless test that uses sound waves to create images of your heart. It provides your doctor with information about the size and shape of your heart and how well your heart's chambers and valves are working. This procedure takes approximately one hour. There are no restrictions for this procedure.  Your cardiac CT will be scheduled at one of the below locations:   [facility] [address] [place], [place] [address] [phone]  [place] [address] [place], [place] [address] [phone]  If scheduled at [facility], please arrive at the [facility] main entrance of [facility] 30 minutes prior to test start time. Proceed to the [facility] Radiology Department (first floor) to check-in and test prep.  If scheduled at [facility], please arrive 15 mins early for check-in and test prep.  Please follow these instructions carefully (unless otherwise directed):  Hold all erectile dysfunction medications at least 3 days (72 hrs) prior to test.  On the Night Before the Test: . Be sure to Drink plenty of water. . Do not consume any caffeinated/decaffeinated beverages or chocolate 12 hours prior to your test. . Do not take any antihistamines 12 hours prior to your test.  On the Day of the Test: . Drink plenty of water. Do not drink any water within one hour of the test. . Do not eat any food 4 hours prior to the test. . You may take your regular medications prior to the test.  . Take metoprolol (Lopressor) two hours prior to test. . HOLD losartan-Hydrochlorothiazide morning of the test. . Hold glimepiride the morning of the test  . Hold metformin the morning of the test.           After the Test: . Drink plenty of water. . After receiving IV contrast, you may experience a mild flushed feeling. This is normal. .  On occasion, you may experience a mild rash up to 24 hours after the test. This is not dangerous. If this occurs, you can take Benadryl 25 mg and increase your fluid intake. . If you experience trouble breathing, this can be serious. If it is severe call 911 IMMEDIATELY. If it is mild, please call our office. . If you take any of these medications: Glipizide/Metformin, Avandament, Glucavance, please do not take 48 hours after completing test unless otherwise instructed.   Once we have  confirmed authorization from your insurance company, we will call you to set up a date and time for your test. Based on how quickly your insurance processes prior authorizations requests, please allow up to 4 weeks to be contacted for scheduling your Cardiac CT appointment. Be advised that routine Cardiac CT appointments could be scheduled as many as 8 weeks after your provider has ordered it.  For non-scheduling related questions, please contact the cardiac imaging nurse navigator should you have any questions/concerns: Marchia Bond, Cardiac Imaging Nurse Navigator Burley Saver, Interim Cardiac Imaging Nurse [facility] Direct Office Dial: [phone]   For scheduling needs, including cancellations and rescheduling, please call Vivien Rota at [phone], option 3.      Follow-Up: At [facility], you and your health needs are our priority.  As part of our continuing mission to provide you with exceptional heart care, we have created designated Provider Care Teams.  These Care Teams include your primary Cardiologist (physician) and Advanced Practice Providers (APPs -  Physician Assistants and Nurse Practitioners) who all work together to provide you with the care you need, when you need it.  We recommend signing up for the patient portal called "MyChart".  Sign up information is provided on this After Visit Summary.  MyChart is used to connect with patients for Virtual Visits (Telemedicine).  Patients are able to view lab/test results, encounter notes, upcoming appointments, etc.  Non-urgent messages can be sent to your provider as well.   To learn more about what you can do with MyChart, go to [email or web address].    Your next appointment:   12 week(s)  The format for your next appointment:   In Person  Provider:   Berniece Salines, DO   Other Instructions   Echocardiogram An echocardiogram is a procedure that uses painless sound waves (ultrasound) to  produce an image of the heart. Images from an echocardiogram can provide important information about:  Signs of coronary artery disease (CAD).  Aneurysm detection. An aneurysm is a weak or damaged part of an artery wall that bulges out from the normal force of blood pumping through the body.  Heart size and shape. Changes in the size or shape of the heart can be associated with certain conditions, including heart failure, aneurysm, and CAD.  Heart muscle function.  Heart valve function.  Signs of a past heart attack.  Fluid buildup around the heart.  Thickening of the heart muscle.  A tumor or infectious growth around the heart valves. Tell a health care provider about:  Any allergies you have.  All medicines you are taking, including vitamins, herbs, eye drops, creams, and over-the-counter medicines.  Any blood disorders you have.  Any surgeries you have had.  Any medical conditions you have.  Whether you are pregnant or may be pregnant. What are the risks? Generally, this is a safe procedure. However, problems may occur, including:  Allergic reaction to dye (contrast) that may be used during the procedure. What  happens before the procedure? No specific preparation is needed. You may eat and drink normally. What happens during the procedure?   An IV tube may be inserted into one of your veins.  You may receive contrast through this tube. A contrast is an injection that improves the quality of the pictures from your heart.  A gel will be applied to your chest.  A wand-like tool (transducer) will be moved over your chest. The gel will help to transmit the sound waves from the transducer.  The sound waves will harmlessly bounce off of your heart to allow the heart images to be captured in real-time motion. The images will be recorded on a computer. The procedure may vary among health care providers and hospitals. What happens after the procedure?  You may return to  your normal, everyday life, including diet, activities, and medicines, unless your health care provider tells you not to do that. Summary  An echocardiogram is a procedure that uses painless sound waves (ultrasound) to produce an image of the heart.  Images from an echocardiogram can provide important information about the size and shape of your heart, heart muscle function, heart valve function, and fluid buildup around your heart.  You do not need to do anything to prepare before this procedure. You may eat and drink normally.  After the echocardiogram is completed, you may return to your normal, everyday life, unless your health care provider tells you not to do that. This information is not intended to replace advice given to you by your health care provider. Make sure you discuss any questions you have with your health care provider. Document Revised: 06/28/2018 Document Reviewed: 04/09/2016 Elsevier Patient Education  [place].  Nitroglycerin sublingual tablets What is this medicine? NITROGLYCERIN (nye troe GLI ser in) is a type of vasodilator. It relaxes blood vessels, increasing the blood and oxygen supply to your heart. This medicine is used to relieve chest pain caused by angina. It is also used to prevent chest pain before activities like climbing stairs, going outdoors in cold weather, or sexual activity. This medicine may be used for other purposes; ask your health care provider or pharmacist if you have questions. COMMON BRAND NAME(S): Nitroquick, Nitrostat, Nitrotab What should I tell my health care provider before I take this medicine? They need to know if you have any of these conditions:  anemia  head injury, recent stroke, or bleeding in the brain  liver disease  previous heart attack  an unusual or allergic reaction to nitroglycerin, other medicines, foods, dyes, or preservatives  pregnant or trying to get pregnant  breast-feeding How should I use this  medicine? Take this medicine by mouth as needed. At the first sign of an angina attack (chest pain or tightness) place one tablet under your tongue. You can also take this medicine 5 to 10 minutes before an event likely to produce chest pain. Follow the directions on the prescription label. Let the tablet dissolve under the tongue. Do not swallow whole. Replace the dose if you accidentally swallow it. It will help if your mouth is not dry. Saliva around the tablet will help it to dissolve more quickly. Do not eat or drink, smoke or chew tobacco while a tablet is dissolving. If you are not better within 5 minutes after taking ONE dose of nitroglycerin, call 9-1-1 immediately to seek emergency medical care. Do not take more than 3 nitroglycerin tablets over 15 minutes. If you take this medicine often to relieve  symptoms of angina, your doctor or health care professional may provide you with different instructions to manage your symptoms. If symptoms do not go away after following these instructions, it is important to call 9-1-1 immediately. Do not take more than 3 nitroglycerin tablets over 15 minutes. Talk to your pediatrician regarding the use of this medicine in children. Special care may be needed. Overdosage: If you think you have taken too much of this medicine contact a poison control center or emergency room at once. NOTE: This medicine is only for you. Do not share this medicine with others. What if I miss a dose? This does not apply. This medicine is only used as needed. What may interact with this medicine? Do not take this medicine with any of the following medications:  certain migraine medicines like ergotamine and dihydroergotamine (DHE)  medicines used to treat erectile dysfunction like sildenafil, tadalafil, and vardenafil  riociguat This medicine may also interact with the following medications:  alteplase  aspirin  heparin  medicines for high blood pressure  medicines for  mental depression  other medicines used to treat angina  phenothiazines like chlorpromazine, mesoridazine, prochlorperazine, thioridazine This list may not describe all possible interactions. Give your health care provider a list of all the medicines, herbs, non-prescription drugs, or dietary supplements you use. Also tell them if you smoke, drink alcohol, or use illegal drugs. Some items may interact with your medicine. What should I watch for while using this medicine? Tell your doctor or health care professional if you feel your medicine is no longer working. Keep this medicine with you at all times. Sit or lie down when you take your medicine to prevent falling if you feel dizzy or faint after using it. Try to remain calm. This will help you to feel better faster. If you feel dizzy, take several deep breaths and lie down with your feet propped up, or bend forward with your head resting between your knees. You may get drowsy or dizzy. Do not drive, use machinery, or do anything that needs mental alertness until you know how this drug affects you. Do not stand or sit up quickly, especially if you are an older patient. This reduces the risk of dizzy or fainting spells. Alcohol can make you more drowsy and dizzy. Avoid alcoholic drinks. Do not treat yourself for coughs, colds, or pain while you are taking this medicine without asking your doctor or health care professional for advice. Some ingredients may increase your blood pressure. What side effects may I notice from receiving this medicine? Side effects that you should report to your doctor or health care professional as soon as possible:  blurred vision  dry mouth  skin rash  sweating  the feeling of extreme pressure in the head  unusually weak or tired Side effects that usually do not require medical attention (report to your doctor or health care professional if they continue or are bothersome):  flushing of the face or  neck  headache  irregular heartbeat, palpitations  nausea, vomiting This list may not describe all possible side effects. Call your doctor for medical advice about side effects. You may report side effects to FDA at 1-800-FDA-1088. Where should I keep my medicine? Keep out of the reach of children. Store at room temperature between 20 and 25 degrees C (68 and 77 degrees F). Store in [facility] [employer]. Protect from light and moisture. Keep tightly closed. Throw away any unused medicine after the expiration date. NOTE: This sheet is  a summary. It may not cover all possible information. If you have questions about this medicine, talk to your doctor, pharmacist, or health care provider.  2020 Elsevier/Gold Standard (2013-01-03 17:57:36)   Cardiac CT Angiogram A cardiac CT angiogram is a procedure to look at the heart and the area around the heart. It may be done to help find the cause of chest pains or other symptoms of heart disease. During this procedure, a substance called contrast dye is injected into the blood vessels in the area to be checked. A large X-ray machine, called a CT scanner, then takes detailed pictures of the heart and the surrounding area. The procedure is also sometimes called a coronary CT angiogram, coronary artery scanning, or CTA. A cardiac CT angiogram allows the health care provider to see how well blood is flowing to and from the heart. The health care provider will be able to see if there are any problems, such as:  Blockage or narrowing of the coronary arteries in the heart.  Fluid around the heart.  Signs of weakness or disease in the muscles, valves, and tissues of the heart. Tell a health care provider about:  Any allergies you have. This is especially important if you have had a previous allergic reaction to contrast dye.  All medicines you are taking, including vitamins, herbs, eye drops, creams, and over-the-counter medicines.  Any blood disorders you  have.  Any surgeries you have had.  Any medical conditions you have.  Whether you are pregnant or may be pregnant.  Any anxiety disorders, chronic pain, or other conditions you have that may increase your stress or prevent you from lying still. What are the risks? Generally, this is a safe procedure. However, problems may occur, including:  Bleeding.  Infection.  Allergic reactions to medicines or dyes.  Damage to other structures or organs.  Kidney damage from the contrast dye that is used.  Increased risk of cancer from radiation exposure. This risk is low. Talk with your health care provider about: ? The risks and benefits of testing. ? How you can receive the lowest dose of radiation. What happens before the procedure?  Wear comfortable clothing and remove any jewelry, glasses, dentures, and hearing aids.  Follow instructions from your health care provider about eating and drinking. This may include: ? For 12 hours before the procedure -- avoid caffeine. This includes tea, coffee, soda, energy drinks, and diet pills. Drink plenty of water or other fluids that do not have caffeine in them. Being well hydrated can prevent complications. ? For 4-6 hours before the procedure -- stop eating and drinking. The contrast dye can cause nausea, but this is less likely if your stomach is empty.  Ask your health care provider about changing or stopping your regular medicines. This is especially important if you are taking diabetes medicines, blood thinners, or medicines to treat problems with erections (erectile dysfunction). What happens during the procedure?   Hair on your chest may need to be removed so that small sticky patches called electrodes can be placed on your chest. These will transmit information that helps to monitor your heart during the procedure.  An IV will be inserted into one of your veins.  You might be given a medicine to control your heart rate during the  procedure. This will help to ensure that good images are obtained.  You will be asked to lie on an exam table. This table will slide in and out of the CT machine  during the procedure.  Contrast dye will be injected into the IV. You might feel warm, or you may get a metallic taste in your mouth.  You will be given a medicine called nitroglycerin. This will relax or dilate the arteries in your heart.  The table that you are lying on will move into the CT machine tunnel for the scan.  The person running the machine will give you instructions while the scans are being done. You may be asked to: ? Keep your arms above your head. ? Hold your breath. ? Stay very still, even if the table is moving.  When the scanning is complete, you will be moved out of the machine.  The IV will be removed. The procedure may vary among health care providers and hospitals. What can I expect after the procedure? After your procedure, it is common to have:  A metallic taste in your mouth from the contrast dye.  A feeling of warmth.  A headache from the nitroglycerin. Follow these instructions at home:  Take over-the-counter and prescription medicines only as told by your health care provider.  If you are told, drink enough fluid to keep your urine pale yellow. This will help to flush the contrast dye out of your body.  Most people can return to their normal activities right after the procedure. Ask your health care provider what activities are safe for you.  It is up to you to get the results of your procedure. Ask your health care provider, or the department that is doing the procedure, when your results will be ready.  Keep all follow-up visits as told by your health care provider. This is important. Contact a health care provider if:  You have any symptoms of allergy to the contrast dye. These include: ? Shortness of breath. ? Rash or hives. ? A racing heartbeat. Summary  A cardiac CT angiogram  is a procedure to look at the heart and the area around the heart. It may be done to help find the cause of chest pains or other symptoms of heart disease.  During this procedure, a large X-ray machine, called a CT scanner, takes detailed pictures of the heart and the surrounding area after a contrast dye has been injected into blood vessels in the area.  Ask your health care provider about changing or stopping your regular medicines before the procedure. This is especially important if you are taking diabetes medicines, blood thinners, or medicines to treat erectile dysfunction.  If you are told, drink enough fluid to keep your urine pale yellow. This will help to flush the contrast dye out of your body. This information is not intended to replace advice given to you by your health care provider. Make sure you discuss any questions you have with your health care provider. Document Revised: 10/31/2018 Document Reviewed: 10/31/2018 Elsevier Patient Education  [place].

## 2020-01-20 NOTE — Addendum Note (Signed)
Addended by: Senaida Ores on: 01/20/2020 11:38 AM   Modules accepted: Orders

## 2020-02-07 ENCOUNTER — Ambulatory Visit (HOSPITAL_BASED_OUTPATIENT_CLINIC_OR_DEPARTMENT_OTHER)
Admission: RE | Admit: 2020-02-07 | Discharge: 2020-02-07 | Disposition: A | Discharge status: Home / Self Care | Payer: BC Managed Care – PPO | Source: Ambulatory Visit | Attending: Cardiology | Admitting: Cardiology

## 2020-02-07 ENCOUNTER — Other Ambulatory Visit: Payer: Self-pay

## 2020-02-07 DIAGNOSIS — R079 Chest pain, unspecified: Secondary | ICD-10-CM | POA: Diagnosis present

## 2020-02-07 DIAGNOSIS — R42 Dizziness and giddiness: Secondary | ICD-10-CM | POA: Diagnosis not present

## 2020-02-07 LAB — ECHOCARDIOGRAM COMPLETE
Area-P 1/2: 2.43 cm2
S' Lateral: 3.21 cm

## 2020-02-10 ENCOUNTER — Telehealth: Payer: Self-pay

## 2020-02-10 NOTE — Telephone Encounter (Signed)
Spoke with patient regarding results and recommendation.  Patient verbalizes understanding and is agreeable to plan of care. Advised patient to call back with any issues or concerns.  

## 2020-02-10 NOTE — Telephone Encounter (Signed)
-----   Message from Berniece Salines, DO sent at 02/10/2020  9:00 AM EST ----- The echo showed that the heart is not fully relaxing like it should ( diastolic dysfunction) ,but otherwise normal. I will discuss it at the next office visit.

## 2020-02-18 ENCOUNTER — Telehealth: Payer: Self-pay

## 2020-02-18 ENCOUNTER — Ambulatory Visit: Payer: Self-pay | Admitting: Nurse Practitioner

## 2020-02-18 NOTE — Telephone Encounter (Signed)
Left message on patients voicemail to please return our call.   

## 2020-02-18 NOTE — Telephone Encounter (Signed)
-----   Message from Berniece Salines, DO sent at 02/12/2020  9:44 PM EST ----- Overall your monitor was unremarkable.

## 2020-02-19 ENCOUNTER — Telehealth: Payer: Self-pay

## 2020-02-19 NOTE — Telephone Encounter (Signed)
-----   Message from Berniece Salines, DO sent at 02/12/2020  9:44 PM EST ----- Overall your monitor was unremarkable.

## 2020-02-19 NOTE — Telephone Encounter (Signed)
Spoke with patient regarding results and recommendation.  Patient verbalizes understanding and is agreeable to plan of care. Advised patient to call back with any issues or concerns.  

## 2020-02-22 LAB — BASIC METABOLIC PANEL
BUN/Creatinine Ratio: 21 — ABNORMAL HIGH (ref 9–20)
BUN: 17 mg/dL (ref 6–24)
CO2: 25 mmol/L (ref 20–29)
Calcium: 9.5 mg/dL (ref 8.7–10.2)
Chloride: 98 mmol/L (ref 96–106)
Creatinine, Ser: 0.81 mg/dL (ref 0.76–1.27)
GFR calc Af Amer: 114 mL/min/{1.73_m2} (ref >59)
GFR calc non Af Amer: 99 mL/min/{1.73_m2} (ref >59)
Glucose: 136 mg/dL — ABNORMAL HIGH (ref 65–99)
Potassium: 3.7 mmol/L (ref 3.5–5.2)
Sodium: 138 mmol/L (ref 134–144)

## 2020-03-02 ENCOUNTER — Telehealth (HOSPITAL_COMMUNITY): Payer: Self-pay | Admitting: Emergency Medicine

## 2020-03-02 NOTE — Telephone Encounter (Signed)
Reaching out to patient to offer assistance regarding upcoming cardiac imaging study; pt verbalizes understanding of appt date/time, parking situation and where to check in, pre-test NPO status and medications ordered, and verified current allergies; name and call back number provided for further questions should they arise Marchia Bond RN Navigator Cardiac Imaging [facility] [phone] office [phone] cell  100mg  metoprolol tartrate to be taken 2 hr prior to [place]

## 2020-03-03 ENCOUNTER — Other Ambulatory Visit: Payer: Self-pay

## 2020-03-03 ENCOUNTER — Ambulatory Visit (HOSPITAL_COMMUNITY)
Admission: RE | Admit: 2020-03-03 | Discharge: 2020-03-03 | Disposition: A | Discharge status: Home / Self Care | Payer: BC Managed Care – PPO | Source: Ambulatory Visit | Attending: Cardiology | Admitting: Cardiology

## 2020-03-03 DIAGNOSIS — R42 Dizziness and giddiness: Secondary | ICD-10-CM | POA: Insufficient documentation

## 2020-03-03 DIAGNOSIS — R079 Chest pain, unspecified: Secondary | ICD-10-CM

## 2020-03-03 MED ORDER — NITROGLYCERIN 0.4 MG SL SUBL
SUBLINGUAL_TABLET | SUBLINGUAL | Status: AC
  Filled 2020-03-03: qty 2

## 2020-03-03 MED ORDER — NITROGLYCERIN 0.4 MG SL SUBL
0.8000 mg | SUBLINGUAL_TABLET | Freq: Once | SUBLINGUAL | Status: AC
  Administered 2020-03-03: 16:00:00 0.8 mg via SUBLINGUAL

## 2020-03-03 MED ORDER — IOHEXOL 350 MG/ML SOLN
80.0000 mL | Freq: Once | INTRAVENOUS | Status: AC | PRN
  Administered 2020-03-03: 16:00:00 80 mL via INTRAVENOUS

## 2020-03-04 ENCOUNTER — Telehealth: Payer: Self-pay

## 2020-03-04 NOTE — Telephone Encounter (Signed)
-----   Message from Berniece Salines, DO sent at 03/04/2020 11:30 AM EST ----- Great news your calcium scoring is 3 which is very low and there is a very minimal calcification in the arteries.  No further testing needs to be done which is continue with your Crestor 10 mg daily.

## 2020-03-04 NOTE — Telephone Encounter (Signed)
Spoke with patient regarding results and recommendation.  Patient verbalizes understanding and is agreeable to plan of care. Advised patient to call back with any issues or concerns.  

## 2020-03-17 ENCOUNTER — Other Ambulatory Visit: Payer: Self-pay | Admitting: Nurse Practitioner

## 2020-03-17 DIAGNOSIS — I10 Essential (primary) hypertension: Secondary | ICD-10-CM

## 2020-03-17 NOTE — Telephone Encounter (Signed)
Pharmacy comment: Alternative Requested: LOSARTAN AND HCTZ ON BACK ORDER CAN GET SEPARATE ORDERS.

## 2020-03-18 MED ORDER — HYDROCHLOROTHIAZIDE 25 MG PO TABS: 25.0000 mg | ORAL_TABLET | Freq: Every day | ORAL | 0 refills | Status: DC

## 2020-03-30 ENCOUNTER — Ambulatory Visit: Payer: BC Managed Care – PPO | Admitting: Nurse Practitioner

## 2020-04-10 ENCOUNTER — Other Ambulatory Visit: Payer: Self-pay | Admitting: Nurse Practitioner

## 2020-04-10 ENCOUNTER — Ambulatory Visit: Payer: BC Managed Care – PPO | Admitting: Cardiology

## 2020-04-10 DIAGNOSIS — I10 Essential (primary) hypertension: Secondary | ICD-10-CM

## 2020-04-13 ENCOUNTER — Encounter: Payer: Self-pay | Admitting: Nurse Practitioner

## 2020-04-13 ENCOUNTER — Other Ambulatory Visit: Payer: Self-pay

## 2020-04-13 ENCOUNTER — Ambulatory Visit: Payer: BC Managed Care – PPO | Admitting: Nurse Practitioner

## 2020-04-13 VITALS — BP 137/74 | HR 75 | Temp 98.2°F | Resp 20 | Ht 70.0 in | Wt 257.0 lb

## 2020-04-13 DIAGNOSIS — E1169 Type 2 diabetes mellitus with other specified complication: Secondary | ICD-10-CM

## 2020-04-13 DIAGNOSIS — I10 Essential (primary) hypertension: Secondary | ICD-10-CM

## 2020-04-13 DIAGNOSIS — E119 Type 2 diabetes mellitus without complications: Secondary | ICD-10-CM | POA: Diagnosis not present

## 2020-04-13 DIAGNOSIS — F3342 Major depressive disorder, recurrent, in full remission: Secondary | ICD-10-CM

## 2020-04-13 DIAGNOSIS — R42 Dizziness and giddiness: Secondary | ICD-10-CM

## 2020-04-13 DIAGNOSIS — F419 Anxiety disorder, unspecified: Secondary | ICD-10-CM

## 2020-04-13 DIAGNOSIS — G4733 Obstructive sleep apnea (adult) (pediatric): Secondary | ICD-10-CM

## 2020-04-13 DIAGNOSIS — K219 Gastro-esophageal reflux disease without esophagitis: Secondary | ICD-10-CM | POA: Diagnosis not present

## 2020-04-13 DIAGNOSIS — E785 Hyperlipidemia, unspecified: Secondary | ICD-10-CM

## 2020-04-13 DIAGNOSIS — Z720 Tobacco use: Secondary | ICD-10-CM

## 2020-04-13 LAB — BAYER DCA HB A1C WAIVED: HB A1C (BAYER DCA - WAIVED): 7.8 % — ABNORMAL HIGH (ref <7.0)

## 2020-04-13 MED ORDER — PANTOPRAZOLE SODIUM 40 MG PO TBEC: 40.0000 mg | DELAYED_RELEASE_TABLET | Freq: Every day | ORAL | 1 refills | Status: DC

## 2020-04-13 MED ORDER — METFORMIN HCL 1000 MG PO TABS: ORAL_TABLET | ORAL | 1 refills | Status: DC

## 2020-04-13 MED ORDER — GLIMEPIRIDE 2 MG PO TABS: 2.0000 mg | ORAL_TABLET | Freq: Every day | ORAL | 1 refills | Status: DC

## 2020-04-13 MED ORDER — LOSARTAN POTASSIUM 100 MG PO TABS: ORAL_TABLET | ORAL | 1 refills | Status: DC

## 2020-04-13 MED ORDER — FENOFIBRATE 145 MG PO TABS: 145.0000 mg | ORAL_TABLET | Freq: Every day | ORAL | 1 refills | Status: DC

## 2020-04-13 MED ORDER — HYDROCHLOROTHIAZIDE 25 MG PO TABS: 25.0000 mg | ORAL_TABLET | Freq: Every day | ORAL | 1 refills | Status: DC

## 2020-04-13 MED ORDER — ROSUVASTATIN CALCIUM 10 MG PO TABS: 10.0000 mg | ORAL_TABLET | Freq: Every day | ORAL | 1 refills | Status: DC

## 2020-04-13 MED ORDER — ESCITALOPRAM OXALATE 10 MG PO TABS: ORAL_TABLET | ORAL | 1 refills | Status: DC

## 2020-04-13 NOTE — Patient Instructions (Signed)
Managing the Challenge of Quitting Smoking Quitting smoking is a physical and mental challenge. You will face cravings, withdrawal symptoms, and temptation. Before quitting, work with your health care provider to make a plan that can help you manage quitting. Preparation can help you quit and keep you from giving in. How to manage lifestyle changes Managing stress Stress can make you want to smoke, and wanting to smoke may cause stress. It is important to find ways to manage your stress. You might try some of the following:  Practice relaxation techniques. ? Breathe slowly and deeply, in through your nose and out through your mouth. ? Listen to music. ? Soak in a bath or take a shower. ? Imagine a peaceful place or vacation.  Get some support. ? Talk with family or friends about your stress. ? Join a support group. ? Talk with a counselor or therapist.  Get some physical activity. ? Go for a walk, run, or bike ride. ? Play a favorite sport. ? Practice yoga.   Medicines Talk with your health care provider about medicines that might help you deal with cravings and make quitting easier for you. Relationships Social situations can be difficult when you are quitting smoking. To manage this, you can:  Avoid parties and other social situations where people might be smoking.  Avoid alcohol.  Leave right away if you have the urge to smoke.  Explain to your family and friends that you are quitting smoking. Ask for support and let them know you might be a bit grumpy.  Plan activities where smoking is not an option. General instructions Be aware that many people gain weight after they quit smoking. However, not everyone does. To keep from gaining weight, have a plan in place before you quit and stick to the plan after you quit. Your plan should include:  Having healthy snacks. When you have a craving, it may help to: ? Eat popcorn, carrots, celery, or other cut vegetables. ? Chew  sugar-free gum.  Changing how you eat. ? Eat small portion sizes at meals. ? Eat 4-6 small meals throughout the day instead of 1-2 large meals a day. ? Be mindful when you eat. Do not watch television or do other things that might distract you as you eat.  Exercising regularly. ? Make time to exercise each day. If you do not have time for a long workout, do short bouts of exercise for 5-10 minutes several times a day. ? Do some form of strengthening exercise, such as weight lifting. ? Do some exercise that gets your heart beating and causes you to breathe deeply, such as walking fast, running, swimming, or biking. This is very important.  Drinking plenty of water or other low-calorie or no-calorie drinks. Drink 6-8 glasses of water daily.   How to recognize withdrawal symptoms Your body and mind may experience discomfort as you try to get used to not having nicotine in your system. These effects are called withdrawal symptoms. They may include:  Feeling hungrier than normal.  Having trouble concentrating.  Feeling irritable or restless.  Having trouble sleeping.  Feeling depressed.  Craving a cigarette. To manage withdrawal symptoms:  Avoid places, people, and activities that trigger your cravings.  Remember why you want to quit.  Get plenty of sleep.  Avoid coffee and other caffeinated drinks. These may worsen some of your symptoms. These symptoms may surprise you. But be assured that they are normal to have when quitting smoking. How to manage cravings   Come up with a plan for how to deal with your cravings. The plan should include the following:  A definition of the specific situation you want to deal with.  An alternative action you will take.  A clear idea for how this action will help.  The name of someone who might help you with this. Cravings usually last for 5-10 minutes. Consider taking the following actions to help you with your plan to deal with  cravings:  Keep your mouth busy. ? Chew sugar-free gum. ? Suck on hard candies or a straw. ? Brush your teeth.  Keep your hands and body busy. ? Change to a different activity right away. ? Squeeze or play with a ball. ? Do an activity or a hobby, such as making bead jewelry, practicing needlepoint, or working with wood. ? Mix up your normal routine. ? Take a short exercise break. Go for a quick walk or run up and down stairs.  Focus on doing something kind or helpful for someone else.  Call a friend or family member to talk during a craving.  Join a support group.  Contact a quitline. Where to find support To get help or find a support group:  Call the National Cancer Institute's Smoking Quitline: 1-800-QUIT NOW (784-8669)  Visit the website of the Substance Abuse and Mental Health Services Administration: www.samhsa.gov  Text QUIT to SmokefreeTXT: 478848 Where to find more information Visit these websites to find more information on quitting smoking:  National Cancer Institute: www.smokefree.gov  American Lung Association: www.lung.org  American Cancer Society: www.cancer.org  Centers for Disease Control and Prevention: www.cdc.gov  American Heart Association: www.heart.org Contact a health care provider if:  You want to change your plan for quitting.  The medicines you are taking are not helping.  Your eating feels out of control or you cannot sleep. Get help right away if:  You feel depressed or become very anxious. Summary  Quitting smoking is a physical and mental challenge. You will face cravings, withdrawal symptoms, and temptation to smoke again. Preparation can help you as you go through these challenges.  Try different techniques to manage stress, handle social situations, and prevent weight gain.  You can deal with cravings by keeping your mouth busy (such as by chewing gum), keeping your hands and body busy, calling family or friends, or  contacting a quitline for people who want to quit smoking.  You can deal with withdrawal symptoms by avoiding places where people smoke, getting plenty of rest, and avoiding drinks with caffeine. This information is not intended to replace advice given to you by your health care provider. Make sure you discuss any questions you have with your health care provider. Document Revised: 12/25/2018 Document Reviewed: 12/25/2018 Elsevier Patient Education  2021 Elsevier Inc.  

## 2020-04-13 NOTE — Progress Notes (Signed)
Subjective:    Patient ID: Clayton [name], male    DOB: 05-10-1960, 60 y.o.   MRN: [id number]   Chief Complaint: Medical Management of Chronic Issues    HPI:  1. Essential hypertension, benign No c/o chest pain, SOB or headache. Doe snot check blood pressure at h ome. BP Readings from Last 3 Encounters:  04/13/20 137/74  03/03/20 118/69  01/20/20 120/80     2. Hyperlipidemia associated with type 2 diabetes mellitus ([place]) He is not watching diet right  Now and is doing no dedicated exercise. He says he stays on the go all the time. Lab Results  Component Value Date   CHOL 123 11/18/2019   HDL 25 (L) 11/18/2019   LDLCALC 70 11/18/2019   LDLDIRECT 70 09/24/2014   TRIG 161 (H) 11/18/2019   CHOLHDL 4.9 11/18/2019    3. Type 2 diabetes mellitus without complication, without long-term current use of insulin (HCC) Fasting blood sugars are running below 140 consistently. He dneies any low blood sugars. Lab Results  Component Value Date   HGBA1C 7.1 (H) 11/18/2019     4. Gastroesophageal reflux disease without esophagitis Is on protonix which he says keeps symptoms under ocntrol most of the time.  5. Anxiety Is on lexapro and is doing well.  GAD 7 : Generalized Anxiety Score 04/13/2020 07/29/2019 04/29/2019 01/21/2019  Nervous, Anxious, on Edge 0 0 0 0  Control/stop worrying 0 0 0 0  Worry too much - different things 0 0 0 0  Trouble relaxing 0 0 0 0  Restless 1 0 0 0  Easily annoyed or irritable 0 0 0 0  Afraid - awful might happen 0 0 0 0  Total GAD 7 Score 1 0 0 0  Anxiety Difficulty Not difficult at all Not difficult at all Not difficult at all -       6. Recurrent major depressive disorder, in full remission ([place]) Again is on lexapro and says he is doing well. Depression screen [facility] 2/9 04/13/2020 11/18/2019 07/29/2019  Decreased Interest 0 0 0  Down, Depressed, Hopeless 0 0 0  PHQ - 2 Score 0 0 0  Altered sleeping 0 - -  Tired, decreased energy 0 - -   Change in appetite 0 - -  Feeling bad or failure about yourself  0 - -  Trouble concentrating 0 - -  Moving slowly or fidgety/restless 0 - -  Suicidal thoughts 0 - -  PHQ-9 Score 0 - -  Difficult doing work/chores Not difficult at all - -     7. OSA (obstructive sleep apnea) Wears CPAP nightly. Sleeps around 6-7 hours a night. Say she feels rested in mornings.  8. Tobacco use Smokes about 1 pack a day. He has been smoking for over 40 years.  9. Morbid obesity ([place]) Weight is up 8lbs from previous Wt Readings from Last 3 Encounters:  04/13/20 257 lb (116.6 kg)  01/20/20 249 lb (112.9 kg)  12/04/19 244 lb (110.7 kg)   BMI Readings from Last 3 Encounters:  04/13/20 36.88 kg/m  01/20/20 35.73 kg/m  12/04/19 35.01 kg/m       Outpatient Encounter Medications as of 04/13/2020  Medication Sig  . aspirin 81 MG EC tablet Take 1 tablet (81 mg total) by mouth daily. Swallow whole.  . escitalopram (LEXAPRO) 10 MG tablet TAKE 1 TABLET BY MOUTH EVERY DAY  . glimepiride (AMARYL) 2 MG tablet Take 1 tablet (2 mg total) by mouth daily before breakfast.  .  glucose blood (ONETOUCH VERIO) test strip Use to check BG 1 to 2 times daily Dx: E11.9  . hydrochlorothiazide (HYDRODIURIL) 25 MG tablet TAKE 1 TABLET (25 MG TOTAL) BY MOUTH DAILY.  Marland Kitchen losartan (COZAAR) 100 MG tablet TAKE 1 TABLET BY MOUTH EVERY DAY  . metFORMIN (GLUCOPHAGE) 1000 MG tablet TAKE 1 TABLET BY MOUTH TWICE A DAY WITH FOOD  . nitroGLYCERIN (NITROSTAT) 0.4 MG SL tablet Place 1 tablet (0.4 mg total) under the tongue every 5 (five) minutes as needed for chest pain.  Glory Rosebush DELICA LANCETS 16X MISC Use to check BG 1 to 2 times daily.  Dx:  E11.9  . pantoprazole (PROTONIX) 40 MG tablet Take 1 tablet (40 mg total) by mouth daily.  . rosuvastatin (CRESTOR) 10 MG tablet Take 1 tablet (10 mg total) by mouth daily.  . fenofibrate (TRICOR) 145 MG tablet Take 1 tablet (145 mg total) by mouth daily.  . metoprolol tartrate (LOPRESSOR)  100 MG tablet Take 1 tablet (100 mg total) by mouth once for 1 dose. 2 hours before ct.     Past Surgical History:  Procedure Laterality Date  . ANTERIOR CRUCIATE LIGAMENT REPAIR Left 2012   Knee    Family History  Problem Relation Age of Onset  . Hypertension Father   . Diabetes Father   . Heart attack Mother   . Breast cancer Mother   . Diabetes Paternal Grandfather   . Colon cancer Neg Hx   . Esophageal cancer Neg Hx   . Rectal cancer Neg Hx   . Stomach cancer Neg Hx   . Prostate cancer Neg Hx   . Pancreatic cancer Neg Hx     New complaints: Having vertigo episodes when he goes from sitting to laying. He has seen cardiology and they suggest seeing ear specialist.  Social history: Lives with his wife. Works at [employer] in [place]  Controlled substance contract: n/a    Review of Systems  Constitutional: Negative for diaphoresis.  Eyes: Negative for pain.  Respiratory: Negative for shortness of breath.   Cardiovascular: Negative for chest pain, palpitations and leg swelling.  Gastrointestinal: Negative for abdominal pain.  Endocrine: Negative for polydipsia.  Skin: Negative for rash.  Neurological: Negative for dizziness, weakness and headaches.  Hematological: Does not bruise/bleed easily.  All other systems reviewed and are negative.      Objective:   Physical Exam Vitals and nursing note reviewed.  Constitutional:      Appearance: Normal appearance. He is well-developed and well-nourished.  HENT:     Head: Normocephalic.     Nose: Nose normal.     Mouth/Throat:     Mouth: Oropharynx is clear and moist.  Eyes:     Extraocular Movements: EOM normal.     Pupils: Pupils are equal, round, and reactive to light.  Neck:     Thyroid: No thyroid mass or thyromegaly.     Vascular: No carotid bruit or JVD.     Trachea: Phonation normal.  Cardiovascular:     Rate and Rhythm: Normal rate and regular rhythm.  Pulmonary:     Effort: Pulmonary effort  is normal. No respiratory distress.     Breath sounds: Normal breath sounds.  Abdominal:     General: Bowel sounds are normal. Aorta is normal.     Palpations: Abdomen is soft.     Tenderness: There is no abdominal tenderness.  Musculoskeletal:        General: Normal range of motion.  Cervical back: Normal range of motion and neck supple.  Lymphadenopathy:     Cervical: No cervical adenopathy.  Skin:    General: Skin is warm and dry.  Neurological:     Mental Status: He is alert and oriented to person, place, and time.  Psychiatric:        Mood and Affect: Mood and affect normal.        Behavior: Behavior normal.        Thought Content: Thought content normal.        Judgment: Judgment normal.     BP 137/74   Pulse 75   Temp 98.2 F (36.8 C) (Temporal)   Resp 20   Ht $R'5\' 10"'lB$  (1.778 m)   Wt 257 lb (116.6 kg)   SpO2 96%   BMI 36.88 kg/m    HGBA1c 7.8%   Assessment & Plan:  Clayton [name] comes in today with chief complaint of Medical Management of Chronic Issues   Diagnosis and orders addressed:  1. Essential hypertension, benign Low sodium diet - CBC with Differential/Platelet - CMP14+EGFR - losartan (COZAAR) 100 MG tablet; TAKE 1 TABLET BY MOUTH EVERY DAY  Dispense: 90 tablet; Refill: 1 - hydrochlorothiazide (HYDRODIURIL) 25 MG tablet; Take 1 tablet (25 mg total) by mouth daily.  Dispense: 90 tablet; Refill: 1  2. Hyperlipidemia associated with type 2 diabetes mellitus (HCC) Low fat diet - Lipid panel - fenofibrate (TRICOR) 145 MG tablet; Take 1 tablet (145 mg total) by mouth daily.  Dispense: 90 tablet; Refill: 1 - rosuvastatin (CRESTOR) 10 MG tablet; Take 1 tablet (10 mg total) by mouth daily.  Dispense: 90 tablet; Refill: 1  3. Type 2 diabetes mellitus without complication, without long-term current use of insulin (HCC) Continue carb counting - Bayer DCA Hb A1c Waived - glimepiride (AMARYL) 2 MG tablet; Take 1 tablet (2 mg total) by mouth daily  before breakfast.  Dispense: 90 tablet; Refill: 1 - metFORMIN (GLUCOPHAGE) 1000 MG tablet; TAKE 1 TABLET BY MOUTH TWICE A DAY WITH FOOD  Dispense: 180 tablet; Refill: 1  4. Gastroesophageal reflux disease without esophagitis Avoid spicy foods Do not eat 2 hours prior to bedtime - pantoprazole (PROTONIX) 40 MG tablet; Take 1 tablet (40 mg total) by mouth daily.  Dispense: 90 tablet; Refill: 1  5. Anxiety Stress management  6. Recurrent major depressive disorder, in full remission ([place]) - escitalopram (LEXAPRO) 10 MG tablet; TAKE 1 TABLET BY MOUTH EVERY DAY  Dispense: 90 tablet; Refill: 1  7. OSA (obstructive sleep apnea) continue with CPAP nightly  8. Tobacco use Smoking cessation encourgaed - CT CHEST LUNG CA SCREEN LOW DOSE W/O CM; Future  9. Morbid obesity ([place]) Discussed diet and exercise for person with BMI >25 Will recheck weight in 3-6 months  10. Vertigo Referral to ENT - Ambulatory referral to ENT   Labs pending Health Maintenance reviewed Diet and exercise encouraged  Follow up plan: 3 months   Mary-Margaret Hassell Done, FNP

## 2020-04-14 ENCOUNTER — Telehealth: Payer: Self-pay | Admitting: Nurse Practitioner

## 2020-04-14 LAB — CMP14+EGFR
ALT: 16 IU/L (ref 0–44)
AST: 13 IU/L (ref 0–40)
Albumin/Globulin Ratio: 1.5 (ref 1.2–2.2)
Albumin: 4.3 g/dL (ref 3.8–4.9)
Alkaline Phosphatase: 74 IU/L (ref 44–121)
BUN/Creatinine Ratio: 19 (ref 9–20)
BUN: 17 mg/dL (ref 6–24)
Bilirubin Total: 0.7 mg/dL (ref 0.0–1.2)
CO2: 26 mmol/L (ref 20–29)
Calcium: 9.5 mg/dL (ref 8.7–10.2)
Chloride: 100 mmol/L (ref 96–106)
Creatinine, Ser: 0.89 mg/dL (ref 0.76–1.27)
GFR calc Af Amer: 109 mL/min/{1.73_m2} (ref >59)
GFR calc non Af Amer: 94 mL/min/{1.73_m2} (ref >59)
Globulin, Total: 2.9 g/dL (ref 1.5–4.5)
Glucose: 149 mg/dL — ABNORMAL HIGH (ref 65–99)
Potassium: 4.5 mmol/L (ref 3.5–5.2)
Sodium: 139 mmol/L (ref 134–144)
Total Protein: 7.2 g/dL (ref 6.0–8.5)

## 2020-04-14 LAB — LIPID PANEL
Chol/HDL Ratio: 5 ratio (ref 0.0–5.0)
Cholesterol, Total: 155 mg/dL (ref 100–199)
HDL: 31 mg/dL — ABNORMAL LOW (ref >39)
LDL Chol Calc (NIH): 90 mg/dL (ref 0–99)
Triglycerides: 196 mg/dL — ABNORMAL HIGH (ref 0–149)
VLDL Cholesterol Cal: 34 mg/dL (ref 5–40)

## 2020-04-14 LAB — CBC WITH DIFFERENTIAL/PLATELET
Basophils Absolute: 0.1 10*3/uL (ref 0.0–0.2)
Basos: 1 %
EOS (ABSOLUTE): 0.3 10*3/uL (ref 0.0–0.4)
Eos: 3 %
Hematocrit: 42 % (ref 37.5–51.0)
Hemoglobin: 14.3 g/dL (ref 13.0–17.7)
Immature Grans (Abs): 0 10*3/uL (ref 0.0–0.1)
Immature Granulocytes: 0 %
Lymphocytes Absolute: 2.2 10*3/uL (ref 0.7–3.1)
Lymphs: 25 %
MCH: 31.2 pg (ref 26.6–33.0)
MCHC: 34 g/dL (ref 31.5–35.7)
MCV: 92 fL (ref 79–97)
Monocytes Absolute: 0.7 10*3/uL (ref 0.1–0.9)
Monocytes: 8 %
Neutrophils Absolute: 5.5 10*3/uL (ref 1.4–7.0)
Neutrophils: 63 %
Platelets: 234 10*3/uL (ref 150–450)
RBC: 4.59 x10E6/uL (ref 4.14–5.80)
RDW: 12.6 % (ref 11.6–15.4)
WBC: 8.8 10*3/uL (ref 3.4–10.8)

## 2020-04-20 ENCOUNTER — Telehealth: Payer: Self-pay | Admitting: Nurse Practitioner

## 2020-07-02 ENCOUNTER — Encounter: Payer: Self-pay | Admitting: Internal Medicine

## 2020-07-13 ENCOUNTER — Ambulatory Visit: Payer: Self-pay | Admitting: Nurse Practitioner

## 2020-07-15 ENCOUNTER — Encounter: Payer: Self-pay | Admitting: Nurse Practitioner

## 2020-09-14 ENCOUNTER — Ambulatory Visit: Payer: BC Managed Care – PPO | Admitting: Nurse Practitioner

## 2020-09-14 ENCOUNTER — Encounter: Payer: Self-pay | Admitting: Nurse Practitioner

## 2020-09-14 ENCOUNTER — Other Ambulatory Visit: Payer: Self-pay

## 2020-09-14 VITALS — BP 129/73 | HR 69 | Temp 98.3°F | Resp 20 | Ht 70.0 in | Wt 252.0 lb

## 2020-09-14 DIAGNOSIS — E119 Type 2 diabetes mellitus without complications: Secondary | ICD-10-CM

## 2020-09-14 DIAGNOSIS — E785 Hyperlipidemia, unspecified: Secondary | ICD-10-CM

## 2020-09-14 DIAGNOSIS — G4733 Obstructive sleep apnea (adult) (pediatric): Secondary | ICD-10-CM

## 2020-09-14 DIAGNOSIS — I10 Essential (primary) hypertension: Secondary | ICD-10-CM | POA: Diagnosis not present

## 2020-09-14 DIAGNOSIS — E1169 Type 2 diabetes mellitus with other specified complication: Secondary | ICD-10-CM

## 2020-09-14 DIAGNOSIS — F3342 Major depressive disorder, recurrent, in full remission: Secondary | ICD-10-CM

## 2020-09-14 DIAGNOSIS — K219 Gastro-esophageal reflux disease without esophagitis: Secondary | ICD-10-CM | POA: Diagnosis not present

## 2020-09-14 DIAGNOSIS — F419 Anxiety disorder, unspecified: Secondary | ICD-10-CM

## 2020-09-14 DIAGNOSIS — Z720 Tobacco use: Secondary | ICD-10-CM

## 2020-09-14 MED ORDER — HYDROCHLOROTHIAZIDE 25 MG PO TABS: 25.0000 mg | ORAL_TABLET | Freq: Every day | ORAL | 1 refills | Status: DC

## 2020-09-14 MED ORDER — PANTOPRAZOLE SODIUM 40 MG PO TBEC: 40.0000 mg | DELAYED_RELEASE_TABLET | Freq: Every day | ORAL | 1 refills | Status: DC

## 2020-09-14 MED ORDER — GLIMEPIRIDE 2 MG PO TABS
2.0000 mg | ORAL_TABLET | Freq: Every day | ORAL | 1 refills | Status: DC
Start: 2020-09-14 — End: 2021-01-11

## 2020-09-14 MED ORDER — ROSUVASTATIN CALCIUM 10 MG PO TABS: 10.0000 mg | ORAL_TABLET | Freq: Every day | ORAL | 1 refills | Status: DC

## 2020-09-14 MED ORDER — FENOFIBRATE 145 MG PO TABS: 145.0000 mg | ORAL_TABLET | Freq: Every day | ORAL | 1 refills | Status: DC

## 2020-09-14 MED ORDER — LOSARTAN POTASSIUM 100 MG PO TABS: ORAL_TABLET | ORAL | 1 refills | Status: DC

## 2020-09-14 MED ORDER — ESCITALOPRAM OXALATE 10 MG PO TABS: ORAL_TABLET | ORAL | 1 refills | Status: DC

## 2020-09-14 MED ORDER — METFORMIN HCL 1000 MG PO TABS: ORAL_TABLET | ORAL | 1 refills | Status: DC

## 2020-09-14 NOTE — Progress Notes (Signed)
Subjective:    Patient ID: Clayton [name], male    DOB: 1960/12/25, 60 y.o.   MRN: [id number]   Chief Complaint: medical management of chronic issues     HPI:  1. Essential hypertension, benign No c/o chest pain, sob or headache. Does not check blood pressure at home. BP Readings from Last 3 Encounters:  04/13/20 137/74  03/03/20 118/69  01/20/20 120/80     2. Hyperlipidemia associated with type 2 diabetes mellitus ([place]) Does not watch diet very closely. Does no dedicated exercise. Lab Results  Component Value Date   CHOL 155 04/13/2020   HDL 31 (L) 04/13/2020   LDLCALC 90 04/13/2020   LDLDIRECT 70 09/24/2014   TRIG 196 (H) 04/13/2020   CHOLHDL 5.0 04/13/2020     3. Type 2 diabetes mellitus without complication, without long-term current use of insulin (HCC) He checks his blood sugars most days . Usually running below 130. Lab Results  Component Value Date   HGBA1C 7.8 (H) 04/13/2020     4. Gastroesophageal reflux disease without esophagitis Is on protonix daily and is doing well.  5. OSA (obstructive sleep apnea) Wears CPAP nightly. Says he feels rested in mornings  6. Anxiety Is on lexapro daily which helps GAD 7 : Generalized Anxiety Score 09/14/2020 04/13/2020 07/29/2019 04/29/2019  Nervous, Anxious, on Edge 0 0 0 0  Control/stop worrying 0 0 0 0  Worry too much - different things 0 0 0 0  Trouble relaxing 1 0 0 0  Restless 1 1 0 0  Easily annoyed or irritable 0 0 0 0  Afraid - awful might happen 0 0 0 0  Total GAD 7 Score 2 1 0 0  Anxiety Difficulty Not difficult at all Not difficult at all Not difficult at all Not difficult at all      7. Recurrent major depressive disorder, in full remission ([place]) Again is on lexapro daily. Denies any medication side effects Depression screen [facility] 2/9 09/14/2020 04/13/2020 11/18/2019 07/29/2019 04/29/2019  Decreased Interest 0 0 0 0 0  Down, Depressed, Hopeless 0 0 0 0 0  PHQ - 2 Score 0 0 0 0 0  Altered sleeping 1  0 - - -  Tired, decreased energy 0 0 - - -  Change in appetite 0 0 - - -  Feeling bad or failure about yourself  0 0 - - -  Trouble concentrating 0 0 - - -  Moving slowly or fidgety/restless 0 0 - - -  Suicidal thoughts 0 0 - - -  PHQ-9 Score 1 0 - - -  Difficult doing work/chores Not difficult at all Not difficult at all - - -     8. Tobacco use Smoke at lease 1/2 pack a day  9. Morbid obesity ([place]) Weight is down 5 pounds  Wt Readings from Last 3 Encounters:  09/14/20 252 lb (114.3 kg)  04/13/20 257 lb (116.6 kg)  01/20/20 249 lb (112.9 kg)   BMI Readings from Last 3 Encounters:  09/14/20 36.16 kg/m  04/13/20 36.88 kg/m  01/20/20 35.73 kg/m      Outpatient Encounter Medications as of 09/14/2020  Medication Sig   aspirin 81 MG EC tablet Take 1 tablet (81 mg total) by mouth daily. Swallow whole.   escitalopram (LEXAPRO) 10 MG tablet TAKE 1 TABLET BY MOUTH EVERY DAY   fenofibrate (TRICOR) 145 MG tablet Take 1 tablet (145 mg total) by mouth daily.   glimepiride (AMARYL) 2 MG tablet Take  1 tablet (2 mg total) by mouth daily before breakfast.   glucose blood (ONETOUCH VERIO) test strip Use to check BG 1 to 2 times daily Dx: E11.9   hydrochlorothiazide (HYDRODIURIL) 25 MG tablet Take 1 tablet (25 mg total) by mouth daily.   losartan (COZAAR) 100 MG tablet TAKE 1 TABLET BY MOUTH EVERY DAY   metFORMIN (GLUCOPHAGE) 1000 MG tablet TAKE 1 TABLET BY MOUTH TWICE A DAY WITH FOOD   metoprolol tartrate (LOPRESSOR) 100 MG tablet Take 1 tablet (100 mg total) by mouth once for 1 dose. 2 hours before ct.   nitroGLYCERIN (NITROSTAT) 0.4 MG SL tablet Place 1 tablet (0.4 mg total) under the tongue every 5 (five) minutes as needed for chest pain.   ONETOUCH DELICA LANCETS 33G MISC Use to check BG 1 to 2 times daily.  Dx:  E11.9   pantoprazole (PROTONIX) 40 MG tablet Take 1 tablet (40 mg total) by mouth daily.   rosuvastatin (CRESTOR) 10 MG tablet Take 1 tablet (10 mg total) by mouth daily.    No facility-administered encounter medications on file as of 09/14/2020.    Past Surgical History:  Procedure Laterality Date   ANTERIOR CRUCIATE LIGAMENT REPAIR Left 2012   Knee    Family History  Problem Relation Age of Onset   Hypertension Father    Diabetes Father    Heart attack Mother    Breast cancer Mother    Diabetes Paternal Grandfather    Colon cancer Neg Hx    Esophageal cancer Neg Hx    Rectal cancer Neg Hx    Stomach cancer Neg Hx    Prostate cancer Neg Hx    Pancreatic cancer Neg Hx     New complaints: None today  Social history: Lives with wife. 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  Controlled substance contract: n/a     Review of Systems  Constitutional:  Negative for diaphoresis.  Eyes:  Negative for pain.  Respiratory:  Negative for shortness of breath.   Cardiovascular:  Negative for chest pain, palpitations and leg swelling.  Gastrointestinal:  Negative for abdominal pain.  Endocrine: Negative for polydipsia.  Skin:  Negative for rash.  Neurological:  Negative for dizziness, weakness and headaches.  Hematological:  Does not bruise/bleed easily.  All other systems reviewed and are negative.     Objective:   Physical Exam Vitals and nursing note reviewed.  Constitutional:      Appearance: Normal appearance. He is well-developed.  HENT:     Head: Normocephalic.     Nose: Nose normal.  Eyes:     Pupils: Pupils are equal, round, and reactive to light.  Neck:     Thyroid: No thyroid mass or thyromegaly.     Vascular: No carotid bruit or JVD.     Trachea: Phonation normal.  Cardiovascular:     Rate and Rhythm: Normal rate and regular rhythm.  Pulmonary:     Effort: Pulmonary effort is normal. No respiratory distress.     Breath sounds: Normal breath sounds.  Abdominal:     General: Bowel sounds are normal.     Palpations: Abdomen is soft.     Tenderness: There is no abdominal tenderness.  Musculoskeletal:        General: Normal range of  motion.     Cervical back: Normal range of motion and neck supple.  Lymphadenopathy:     Cervical: No cervical adenopathy.  Skin:    General: Skin is warm and dry.  Neurological:  Mental Status: He is alert and oriented to person, place, and time.  Psychiatric:        Behavior: Behavior normal.        Thought Content: Thought content normal.        Judgment: Judgment normal.    BP 129/73   Pulse 69   Temp 98.3 F (36.8 C) (Temporal)   Resp 20   Ht $R'5\' 10"'To$  (1.778 m)   Wt 252 lb (114.3 kg)   SpO2 99%   BMI 36.16 kg/m   Hgba1c 8.2     Assessment & Plan:   Clayton [name] comes in today with chief complaint of Medical Management of Chronic Issues   Diagnosis and orders addressed:  1. Essential hypertension, benign Pow sodium diet - CBC with Differential/Platelet - CMP14+EGFR - hydrochlorothiazide (HYDRODIURIL) 25 MG tablet; Take 1 tablet (25 mg total) by mouth daily.  Dispense: 90 tablet; Refill: 1 - losartan (COZAAR) 100 MG tablet; TAKE 1 TABLET BY MOUTH EVERY DAY  Dispense: 90 tablet; Refill: 1  2. Hyperlipidemia associated with type 2 diabetes mellitus (HCC) Low fat diet - Lipid panel - fenofibrate (TRICOR) 145 MG tablet; Take 1 tablet (145 mg total) by mouth daily.  Dispense: 90 tablet; Refill: 1 - rosuvastatin (CRESTOR) 10 MG tablet; Take 1 tablet (10 mg total) by mouth daily.  Dispense: 90 tablet; Refill: 1  3. Type 2 diabetes mellitus without complication, without long-term current use of insulin (HCC) Stricter carb counting - Bayer DCA Hb A1c Waived - glimepiride (AMARYL) 2 MG tablet; Take 1 tablet (2 mg total) by mouth daily before breakfast.  Dispense: 90 tablet; Refill: 1 - metFORMIN (GLUCOPHAGE) 1000 MG tablet; TAKE 1 TABLET BY MOUTH TWICE A DAY WITH FOOD  Dispense: 180 tablet; Refill: 1  4. Gastroesophageal reflux disease without esophagitis Avoid spicy foods Do not eat 2 hours prior to bedtime - pantoprazole (PROTONIX) 40 MG tablet; Take 1 tablet  (40 mg total) by mouth daily.  Dispense: 90 tablet; Refill: 1  5. OSA (obstructive sleep apnea) Wear CPAP  6. Anxiety Stress management  7. Recurrent major depressive disorder, in full remission ([place]) - escitalopram (LEXAPRO) 10 MG tablet; TAKE 1 TABLET BY MOUTH EVERY DAY  Dispense: 90 tablet; Refill: 1  8. Tobacco use Smoking cessation  9. Morbid obesity ([place]) Discussed diet and exercise for person with BMI >25 Will recheck weight in 3-6 months    Labs pending Health Maintenance reviewed Diet and exercise encouraged  Follow up plan: 3 months   Mary-Margaret Hassell Done, FNP

## 2020-09-14 NOTE — Patient Instructions (Signed)

## 2020-09-15 LAB — LIPID PANEL
Chol/HDL Ratio: 5.1 ratio — ABNORMAL HIGH (ref 0.0–5.0)
Cholesterol, Total: 143 mg/dL (ref 100–199)
HDL: 28 mg/dL — ABNORMAL LOW
LDL Chol Calc (NIH): 81 mg/dL (ref 0–99)
Triglycerides: 203 mg/dL — ABNORMAL HIGH (ref 0–149)
VLDL Cholesterol Cal: 34 mg/dL (ref 5–40)

## 2020-09-15 LAB — CMP14+EGFR
ALT: 16 [IU]/L (ref 0–44)
AST: 11 [IU]/L (ref 0–40)
Albumin/Globulin Ratio: 1.6 (ref 1.2–2.2)
Albumin: 4.4 g/dL (ref 3.8–4.9)
Alkaline Phosphatase: 71 [IU]/L (ref 44–121)
BUN/Creatinine Ratio: 21 — ABNORMAL HIGH (ref 9–20)
BUN: 19 mg/dL (ref 6–24)
Bilirubin Total: 0.7 mg/dL (ref 0.0–1.2)
CO2: 23 mmol/L (ref 20–29)
Calcium: 9.5 mg/dL (ref 8.7–10.2)
Chloride: 100 mmol/L (ref 96–106)
Creatinine, Ser: 0.89 mg/dL (ref 0.76–1.27)
Globulin, Total: 2.8 g/dL (ref 1.5–4.5)
Glucose: 151 mg/dL — ABNORMAL HIGH (ref 65–99)
Potassium: 4.6 mmol/L (ref 3.5–5.2)
Sodium: 139 mmol/L (ref 134–144)
Total Protein: 7.2 g/dL (ref 6.0–8.5)
eGFR: 99 mL/min/{1.73_m2}

## 2020-09-15 LAB — CBC WITH DIFFERENTIAL/PLATELET
Basophils Absolute: 0.1 10*3/uL (ref 0.0–0.2)
Basos: 1 %
EOS (ABSOLUTE): 0.4 10*3/uL (ref 0.0–0.4)
Eos: 4 %
Hematocrit: 41.9 % (ref 37.5–51.0)
Hemoglobin: 14.3 g/dL (ref 13.0–17.7)
Immature Grans (Abs): 0 10*3/uL (ref 0.0–0.1)
Immature Granulocytes: 0 %
Lymphocytes Absolute: 2.3 10*3/uL (ref 0.7–3.1)
Lymphs: 24 %
MCH: 30.9 pg (ref 26.6–33.0)
MCHC: 34.1 g/dL (ref 31.5–35.7)
MCV: 91 fL (ref 79–97)
Monocytes Absolute: 0.8 10*3/uL (ref 0.1–0.9)
Monocytes: 8 %
Neutrophils Absolute: 6.1 10*3/uL (ref 1.4–7.0)
Neutrophils: 63 %
Platelets: 240 10*3/uL (ref 150–450)
RBC: 4.63 x10E6/uL (ref 4.14–5.80)
RDW: 12.6 % (ref 11.6–15.4)
WBC: 9.8 10*3/uL (ref 3.4–10.8)

## 2020-09-15 LAB — BAYER DCA HB A1C WAIVED: HB A1C (BAYER DCA - WAIVED): 8.2 % — ABNORMAL HIGH (ref <7.0)

## 2020-12-14 ENCOUNTER — Ambulatory Visit: Payer: Self-pay | Admitting: Nurse Practitioner

## 2020-12-15 ENCOUNTER — Ambulatory Visit: Payer: Self-pay | Admitting: Nurse Practitioner

## 2021-01-11 ENCOUNTER — Ambulatory Visit: Payer: BC Managed Care – PPO | Admitting: Nurse Practitioner

## 2021-01-11 ENCOUNTER — Other Ambulatory Visit: Payer: Self-pay

## 2021-01-11 ENCOUNTER — Encounter: Payer: Self-pay | Admitting: Nurse Practitioner

## 2021-01-11 VITALS — BP 132/77 | HR 69 | Temp 98.4°F | Resp 20 | Ht 70.0 in | Wt 251.0 lb

## 2021-01-11 DIAGNOSIS — E785 Hyperlipidemia, unspecified: Secondary | ICD-10-CM

## 2021-01-11 DIAGNOSIS — I10 Essential (primary) hypertension: Secondary | ICD-10-CM | POA: Diagnosis not present

## 2021-01-11 DIAGNOSIS — E349 Endocrine disorder, unspecified: Secondary | ICD-10-CM

## 2021-01-11 DIAGNOSIS — Z23 Encounter for immunization: Secondary | ICD-10-CM | POA: Diagnosis not present

## 2021-01-11 DIAGNOSIS — E119 Type 2 diabetes mellitus without complications: Secondary | ICD-10-CM | POA: Diagnosis not present

## 2021-01-11 DIAGNOSIS — E1169 Type 2 diabetes mellitus with other specified complication: Secondary | ICD-10-CM

## 2021-01-11 DIAGNOSIS — F419 Anxiety disorder, unspecified: Secondary | ICD-10-CM

## 2021-01-11 DIAGNOSIS — K219 Gastro-esophageal reflux disease without esophagitis: Secondary | ICD-10-CM

## 2021-01-11 DIAGNOSIS — G4733 Obstructive sleep apnea (adult) (pediatric): Secondary | ICD-10-CM

## 2021-01-11 DIAGNOSIS — Z720 Tobacco use: Secondary | ICD-10-CM

## 2021-01-11 DIAGNOSIS — F3342 Major depressive disorder, recurrent, in full remission: Secondary | ICD-10-CM

## 2021-01-11 LAB — BAYER DCA HB A1C WAIVED: HB A1C (BAYER DCA - WAIVED): 7.6 % — ABNORMAL HIGH (ref 4.8–5.6)

## 2021-01-11 MED ORDER — GLIMEPIRIDE 2 MG PO TABS: 2.0000 mg | ORAL_TABLET | Freq: Every day | ORAL | 1 refills | Status: DC

## 2021-01-11 MED ORDER — FENOFIBRATE 145 MG PO TABS: 145.0000 mg | ORAL_TABLET | Freq: Every day | ORAL | 1 refills | Status: DC

## 2021-01-11 MED ORDER — HYDROCHLOROTHIAZIDE 25 MG PO TABS: 25.0000 mg | ORAL_TABLET | Freq: Every day | ORAL | 1 refills | Status: DC

## 2021-01-11 MED ORDER — LOSARTAN POTASSIUM 100 MG PO TABS: ORAL_TABLET | ORAL | 1 refills | Status: DC

## 2021-01-11 MED ORDER — ESCITALOPRAM OXALATE 10 MG PO TABS: ORAL_TABLET | ORAL | 1 refills | Status: DC

## 2021-01-11 MED ORDER — METFORMIN HCL 1000 MG PO TABS: ORAL_TABLET | ORAL | 1 refills | Status: DC

## 2021-01-11 MED ORDER — PANTOPRAZOLE SODIUM 40 MG PO TBEC: 40.0000 mg | DELAYED_RELEASE_TABLET | Freq: Every day | ORAL | 1 refills | Status: DC

## 2021-01-11 MED ORDER — ROSUVASTATIN CALCIUM 10 MG PO TABS: 10.0000 mg | ORAL_TABLET | Freq: Every day | ORAL | 1 refills | Status: DC

## 2021-01-11 NOTE — Addendum Note (Signed)
Addended by: Rolena Infante on: 01/11/2021 03:53 PM   Modules accepted: Orders

## 2021-01-11 NOTE — Progress Notes (Signed)
Subjective:    Patient ID: Clayton [name], male    DOB: 07/19/1960, 60 y.o.   MRN: [id number]  Chief Complaint: medical management of chronic issues     HPI:  1. Essential hypertension, benign No c/o chest pain, sob or headache. Does not check blood pressure at home. BP Readings from Last 3 Encounters:  01/11/21 132/77  09/14/20 129/73  04/13/20 137/74     2. Hyperlipidemia associated with type 2 diabetes mellitus ([place]) Does not watch diet and does little to no exercise. Lab Results  Component Value Date   CHOL 143 09/14/2020   HDL 28 (L) 09/14/2020   LDLCALC 81 09/14/2020   LDLDIRECT 70 09/24/2014   TRIG 203 (H) 09/14/2020   CHOLHDL 5.1 (H) 09/14/2020     3. Type 2 diabetes mellitus without complication, without long-term current use of insulin (HCC) Fasting blood sugars are running in the 120-130. No low blood sugars. He is not good at watching his diet. Lab Results  Component Value Date   HGBA1C 8.2 (H) 09/14/2020     4. Gastroesophageal reflux disease without esophagitis Is on protonix daily and is doing well.  5. Testosterone deficiency No c/o fatigue  6. Anxiety Is on lexapro and is working well GAD 7 : Generalized Anxiety Score 01/11/2021 09/14/2020 04/13/2020 07/29/2019  Nervous, Anxious, on Edge 0 0 0 0  Control/stop worrying 0 0 0 0  Worry too much - different things 0 0 0 0  Trouble relaxing 0 1 0 0  Restless 0 1 1 0  Easily annoyed or irritable 0 0 0 0  Afraid - awful might happen 0 0 0 0  Total GAD 7 Score 0 2 1 0  Anxiety Difficulty Not difficult at all Not difficult at all Not difficult at all Not difficult at all     7. Recurrent major depressive disorder, in full remission ([place]) Is on lexapro and is doing well Depression screen [facility] 2/9 01/11/2021 09/14/2020 04/13/2020  Decreased Interest 0 0 0  Down, Depressed, Hopeless 0 0 0  PHQ - 2 Score 0 0 0  Altered sleeping 0 1 0  Tired, decreased energy 1 0 0  Change in appetite 0 0 0   Feeling bad or failure about yourself  0 0 0  Trouble concentrating 0 0 0  Moving slowly or fidgety/restless 0 0 0  Suicidal thoughts 0 0 0  PHQ-9 Score 1 1 0  Difficult doing work/chores Not difficult at all Not difficult at all Not difficult at all     8. Tobacco use Smokes over a pack a day  9. OSA (obstructive sleep apnea) Wears cpap nightly and says he feels rested in mornings  10. Morbid obesity ([place]) No recent weight changes Wt Readings from Last 3 Encounters:  01/11/21 251 lb (113.9 kg)  09/14/20 252 lb (114.3 kg)  04/13/20 257 lb (116.6 kg)   BMI Readings from Last 3 Encounters:  01/11/21 36.01 kg/m  09/14/20 36.16 kg/m  04/13/20 36.88 kg/m     Outpatient Encounter Medications as of 01/11/2021  Medication Sig   aspirin 81 MG EC tablet Take 1 tablet (81 mg total) by mouth daily. Swallow whole.   escitalopram (LEXAPRO) 10 MG tablet TAKE 1 TABLET BY MOUTH EVERY DAY   fenofibrate (TRICOR) 145 MG tablet Take 1 tablet (145 mg total) by mouth daily.   glimepiride (AMARYL) 2 MG tablet Take 1 tablet (2 mg total) by mouth daily before breakfast.   glucose blood (  ONETOUCH VERIO) test strip Use to check BG 1 to 2 times daily Dx: E11.9   hydrochlorothiazide (HYDRODIURIL) 25 MG tablet Take 1 tablet (25 mg total) by mouth daily.   losartan (COZAAR) 100 MG tablet TAKE 1 TABLET BY MOUTH EVERY DAY   metFORMIN (GLUCOPHAGE) 1000 MG tablet TAKE 1 TABLET BY MOUTH TWICE A DAY WITH FOOD   nitroGLYCERIN (NITROSTAT) 0.4 MG SL tablet Place 1 tablet (0.4 mg total) under the tongue every 5 (five) minutes as needed for chest pain.   ONETOUCH DELICA LANCETS 67H MISC Use to check BG 1 to 2 times daily.  Dx:  E11.9   pantoprazole (PROTONIX) 40 MG tablet Take 1 tablet (40 mg total) by mouth daily.   rosuvastatin (CRESTOR) 10 MG tablet Take 1 tablet (10 mg total) by mouth daily.   No facility-administered encounter medications on file as of 01/11/2021.    Past Surgical History:  Procedure  Laterality Date   ANTERIOR CRUCIATE LIGAMENT REPAIR Left 2012   Knee    Family History  Problem Relation Age of Onset   Hypertension Father    Diabetes Father    Heart attack Mother    Breast cancer Mother    Diabetes Paternal Grandfather    Colon cancer Neg Hx    Esophageal cancer Neg Hx    Rectal cancer Neg Hx    Stomach cancer Neg Hx    Prostate cancer Neg Hx    Pancreatic cancer Neg Hx     New complaints: None today  Social history: Lives with his wife. They own local restaurant  Controlled substance contract: n/a      Review of Systems  Constitutional:  Negative for diaphoresis.  Eyes:  Negative for pain.  Respiratory:  Negative for shortness of breath.   Cardiovascular:  Negative for chest pain, palpitations and leg swelling.  Gastrointestinal:  Negative for abdominal pain.  Endocrine: Negative for polydipsia.  Skin:  Negative for rash.  Neurological:  Negative for dizziness, weakness and headaches.  Hematological:  Does not bruise/bleed easily.  All other systems reviewed and are negative.     Objective:   Physical Exam Vitals and nursing note reviewed.  Constitutional:      Appearance: Normal appearance. He is well-developed.  HENT:     Head: Normocephalic.     Nose: Nose normal.  Eyes:     Pupils: Pupils are equal, round, and reactive to light.  Neck:     Thyroid: No thyroid mass or thyromegaly.     Vascular: No carotid bruit or JVD.     Trachea: Phonation normal.  Cardiovascular:     Rate and Rhythm: Normal rate and regular rhythm.  Pulmonary:     Effort: Pulmonary effort is normal. No respiratory distress.     Breath sounds: Normal breath sounds.  Abdominal:     General: Bowel sounds are normal.     Palpations: Abdomen is soft.     Tenderness: There is no abdominal tenderness.  Musculoskeletal:        General: Normal range of motion.     Cervical back: Normal range of motion and neck supple.  Lymphadenopathy:     Cervical: No cervical  adenopathy.  Skin:    General: Skin is warm and dry.  Neurological:     Mental Status: He is alert and oriented to person, place, and time.  Psychiatric:        Behavior: Behavior normal.        Thought Content: Thought  content normal.        Judgment: Judgment normal.    BP 132/77   Pulse 69   Temp 98.4 F (36.9 C) (Temporal)   Resp 20   Ht $R'5\' 10"'qP$  (1.778 m)   Wt 251 lb (113.9 kg)   SpO2 97%   BMI 36.01 kg/m   HGBA1c 7.6%     Assessment & Plan:   Clayton [name] comes in today with chief complaint of Medical Management of Chronic Issues   Diagnosis and orders addressed:  1. Essential hypertension, benign Low soidum diet - CBC with Differential/Platelet - CMP14+EGFR - hydrochlorothiazide (HYDRODIURIL) 25 MG tablet; Take 1 tablet (25 mg total) by mouth daily.  Dispense: 90 tablet; Refill: 1 - losartan (COZAAR) 100 MG tablet; TAKE 1 TABLET BY MOUTH EVERY DAY  Dispense: 90 tablet; Refill: 1  2. Hyperlipidemia associated with type 2 diabetes mellitus (HCC) Low fat diet - Lipid panel - fenofibrate (TRICOR) 145 MG tablet; Take 1 tablet (145 mg total) by mouth daily.  Dispense: 90 tablet; Refill: 1 - rosuvastatin (CRESTOR) 10 MG tablet; Take 1 tablet (10 mg total) by mouth daily.  Dispense: 90 tablet; Refill: 1  3. Type 2 diabetes mellitus without complication, without long-term current use of insulin (HCC) Stricter carb counting - Bayer DCA Hb A1c Waived - Microalbumin / creatinine urine ratio - Bayer DCA Hb A1c Waived - glimepiride (AMARYL) 2 MG tablet; Take 1 tablet (2 mg total) by mouth daily before breakfast.  Dispense: 90 tablet; Refill: 1 - metFORMIN (GLUCOPHAGE) 1000 MG tablet; TAKE 1 TABLET BY MOUTH TWICE A DAY WITH FOOD  Dispense: 180 tablet; Refill: 1  4. Gastroesophageal reflux disease without esophagitis Avoid spicy foods Do not eat 2 hours prior to bedtime - pantoprazole (PROTONIX) 40 MG tablet; Take 1 tablet (40 mg total) by mouth daily.  Dispense: 90  tablet; Refill: 1  5. Testosterone deficiency Labs pending - Testosterone,Free and Total  6. Anxiety Stress management  7. Recurrent major depressive disorder, in full remission ([place]) - escitalopram (LEXAPRO) 10 MG tablet; TAKE 1 TABLET BY MOUTH EVERY DAY  Dispense: 90 tablet; Refill: 1  8. Tobacco use Smoking cessation encouraged  9. OSA (obstructive sleep apnea) Continue to wear CPAP  10. Morbid obesity ([place]) Discussed diet and exercise for person with BMI >25 Will recheck weight in 3-6 months    Labs pending Health Maintenance reviewed Diet and exercise encouraged  Follow up plan: 3 months   Mary-Margaret Hassell Done, FNP

## 2021-01-11 NOTE — Patient Instructions (Signed)
Managing the Challenge of Quitting Smoking Quitting smoking is a physical and mental challenge. You will face cravings, withdrawal symptoms, and temptation. Before quitting, work with your health care provider to make a plan that can help you manage quitting. Preparation canhelp you quit and keep you from giving in. How to manage lifestyle changes Managing stress Stress can make you want to smoke, and wanting to smoke may cause stress. It is important to find ways to manage your stress. You might try some of the following: Practice relaxation techniques. Breathe slowly and deeply, in through your nose and out through your mouth. Listen to music. Soak in a bath or take a shower. Imagine a peaceful place or vacation. Get some support. Talk with family or friends about your stress. Join a support group. Talk with a counselor or therapist. Get some physical activity. Go for a walk, run, or bike ride. Play a favorite sport. Practice yoga.  Medicines Talk with your health care provider about medicines that might help you dealwith cravings and make quitting easier for you. Relationships Social situations can be difficult when you are quitting smoking. To manage this, you can: Avoid parties and other social situations where people might be smoking. Avoid alcohol. Leave right away if you have the urge to smoke. Explain to your family and friends that you are quitting smoking. Ask for support and let them know you might be a bit grumpy. Plan activities where smoking is not an option. General instructions Be aware that many people gain weight after they quit smoking. However, not everyone does. To keep from gaining weight, have a plan in place before you quit and stick to the plan after you quit. Your plan should include: Having healthy snacks. When you have a craving, it may help to: Eat popcorn, carrots, celery, or other cut vegetables. Chew sugar-free gum. Changing how you eat. Eat small  portion sizes at meals. Eat 4-6 small meals throughout the day instead of 1-2 large meals a day. Be mindful when you eat. Do not watch television or do other things that might distract you as you eat. Exercising regularly. Make time to exercise each day. If you do not have time for a long workout, do short bouts of exercise for 5-10 minutes several times a day. Do some form of strengthening exercise, such as weight lifting. Do some exercise that gets your heart beating and causes you to breathe deeply, such as walking fast, running, swimming, or biking. This is very important. Drinking plenty of water or other low-calorie or no-calorie drinks. Drink 6-8 glasses of water daily.  How to recognize withdrawal symptoms Your body and mind may experience discomfort as you try to get used to not having nicotine in your system. These effects are called withdrawal symptoms. They may include: Feeling hungrier than normal. Having trouble concentrating. Feeling irritable or restless. Having trouble sleeping. Feeling depressed. Craving a cigarette. To manage withdrawal symptoms: Avoid places, people, and activities that trigger your cravings. Remember why you want to quit. Get plenty of sleep. Avoid coffee and other caffeinated drinks. These may worsen some of your symptoms. These symptoms may surprise you. But be assured that they are normal to havewhen quitting smoking. How to manage cravings Come up with a plan for how to deal with your cravings. The plan should include the following: A definition of the specific situation you want to deal with. An alternative action you will take. A clear idea for how this action will help. The   name of someone who might help you with this. Cravings usually last for 5-10 minutes. Consider taking the following actions to help you with your plan to deal with cravings: Keep your mouth busy. Chew sugar-free gum. Suck on hard candies or a straw. Brush your  teeth. Keep your hands and body busy. Change to a different activity right away. Squeeze or play with a ball. Do an activity or a hobby, such as making bead jewelry, practicing needlepoint, or working with wood. Mix up your normal routine. Take a short exercise break. Go for a quick walk or run up and down stairs. Focus on doing something kind or helpful for someone else. Call a friend or family member to talk during a craving. Join a support group. Contact a quitline. Where to find support To get help or find a support group: Call the National Cancer Institute's Smoking Quitline: 1-800-QUIT NOW (784-8669) Visit the website of the Substance Abuse and Mental Health Services Administration: www.samhsa.gov Text QUIT to SmokefreeTXT: 478848 Where to find more information Visit these websites to find more information on quitting smoking: National Cancer Institute: www.smokefree.gov American Lung Association: www.lung.org American Cancer Society: www.cancer.org Centers for Disease Control and Prevention: www.cdc.gov American Heart Association: www.heart.org Contact a health care provider if: You want to change your plan for quitting. The medicines you are taking are not helping. Your eating feels out of control or you cannot sleep. Get help right away if: You feel depressed or become very anxious. Summary Quitting smoking is a physical and mental challenge. You will face cravings, withdrawal symptoms, and temptation to smoke again. Preparation can help you as you go through these challenges. Try different techniques to manage stress, handle social situations, and prevent weight gain. You can deal with cravings by keeping your mouth busy (such as by chewing gum), keeping your hands and body busy, calling family or friends, or contacting a quitline for people who want to quit smoking. You can deal with withdrawal symptoms by avoiding places where people smoke, getting plenty of rest, and  avoiding drinks with caffeine. This information is not intended to replace advice given to you by your health care provider. Make sure you discuss any questions you have with your healthcare provider. Document Revised: 12/25/2018 Document Reviewed: 12/25/2018 Elsevier Patient Education  2022 Elsevier Inc.  

## 2021-01-12 LAB — CBC WITH DIFFERENTIAL/PLATELET
Basophils Absolute: 0.1 10*3/uL (ref 0.0–0.2)
Basos: 1 %
EOS (ABSOLUTE): 0.3 10*3/uL (ref 0.0–0.4)
Eos: 4 %
Hematocrit: 42.8 % (ref 37.5–51.0)
Hemoglobin: 13.9 g/dL (ref 13.0–17.7)
Immature Grans (Abs): 0 10*3/uL (ref 0.0–0.1)
Immature Granulocytes: 0 %
Lymphocytes Absolute: 2.4 10*3/uL (ref 0.7–3.1)
Lymphs: 26 %
MCH: 30.2 pg (ref 26.6–33.0)
MCHC: 32.5 g/dL (ref 31.5–35.7)
MCV: 93 fL (ref 79–97)
Monocytes Absolute: 0.8 10*3/uL (ref 0.1–0.9)
Monocytes: 9 %
Neutrophils Absolute: 5.7 10*3/uL (ref 1.4–7.0)
Neutrophils: 60 %
Platelets: 258 10*3/uL (ref 150–450)
RBC: 4.61 x10E6/uL (ref 4.14–5.80)
RDW: 12.5 % (ref 11.6–15.4)
WBC: 9.3 10*3/uL (ref 3.4–10.8)

## 2021-01-12 LAB — LIPID PANEL
Chol/HDL Ratio: 5 ratio (ref 0.0–5.0)
Cholesterol, Total: 141 mg/dL (ref 100–199)
HDL: 28 mg/dL — ABNORMAL LOW (ref >39)
LDL Chol Calc (NIH): 85 mg/dL (ref 0–99)
Triglycerides: 162 mg/dL — ABNORMAL HIGH (ref 0–149)
VLDL Cholesterol Cal: 28 mg/dL (ref 5–40)

## 2021-01-12 LAB — CMP14+EGFR
ALT: 13 IU/L (ref 0–44)
AST: 12 IU/L (ref 0–40)
Albumin/Globulin Ratio: 1.7 (ref 1.2–2.2)
Albumin: 4.5 g/dL (ref 3.8–4.9)
Alkaline Phosphatase: 67 IU/L (ref 44–121)
BUN/Creatinine Ratio: 18 (ref 9–20)
BUN: 16 mg/dL (ref 6–24)
Bilirubin Total: 0.8 mg/dL (ref 0.0–1.2)
CO2: 26 mmol/L (ref 20–29)
Calcium: 9.7 mg/dL (ref 8.7–10.2)
Chloride: 97 mmol/L (ref 96–106)
Creatinine, Ser: 0.89 mg/dL (ref 0.76–1.27)
Globulin, Total: 2.7 g/dL (ref 1.5–4.5)
Glucose: 162 mg/dL — ABNORMAL HIGH (ref 70–99)
Potassium: 4.7 mmol/L (ref 3.5–5.2)
Sodium: 137 mmol/L (ref 134–144)
Total Protein: 7.2 g/dL (ref 6.0–8.5)
eGFR: 99 mL/min/{1.73_m2} (ref >59)

## 2021-01-12 LAB — TESTOSTERONE,FREE AND TOTAL
Testosterone, Free: 8.6 pg/mL (ref 7.2–24.0)
Testosterone: 351 ng/dL (ref 264–916)

## 2021-02-08 LAB — HM DIABETES EYE EXAM

## 2021-04-05 ENCOUNTER — Ambulatory Visit: Payer: BC Managed Care – PPO | Admitting: Nurse Practitioner

## 2021-04-05 NOTE — Progress Notes (Deleted)
Subjective:    Patient ID: Clayton [name], male    DOB: Feb 14, 1961, 61 y.o.   MRN: [id number]  Chief Complaint: No chief complaint on file.    HPI:  Clayton [name] is a 61 y.o. who identifies as a male who was assigned male at birth.   Social history: Lives with: *** Work history: ***   Comes in today for follow up of the following chronic medical issues:  1. Essential hypertension, benign No c/o chest pain, sob or headache. Does not check blood pressure at home. BP Readings from Last 3 Encounters:  01/11/21 132/77  09/14/20 129/73  04/13/20 137/74     2. Hyperlipidemia associated with type 2 diabetes mellitus ([place]) Does not watch diet very closely and does no exercise  3. Type 2 diabetes mellitus without complication, without long-term current use of insulin (HCC) ***  4. Gastroesophageal reflux disease without esophagitis Is on protonix dialy and is doing well.  5. OSA (obstructive sleep apnea) ***  6. Anxiety Is on lexapro which helps  7. Recurrent major depressive disorder, in full remission ([place]) Again is on lexapro and is doing well no complaint of medication side effects  8. Testosterone deficiency ***  9. Tobacco use ***  10. Hx of adenomatous colonic polyps ***  11. Morbid obesity (HCC) ***   New complaints: ***  No Known Allergies Outpatient Encounter Medications as of 04/05/2021  Medication Sig   aspirin 81 MG EC tablet Take 1 tablet (81 mg total) by mouth daily. Swallow whole.   escitalopram (LEXAPRO) 10 MG tablet TAKE 1 TABLET BY MOUTH EVERY DAY   fenofibrate (TRICOR) 145 MG tablet Take 1 tablet (145 mg total) by mouth daily.   glimepiride (AMARYL) 2 MG tablet Take 1 tablet (2 mg total) by mouth daily before breakfast.   glucose blood (ONETOUCH VERIO) test strip Use to check BG 1 to 2 times daily Dx: E11.9   hydrochlorothiazide (HYDRODIURIL) 25 MG tablet Take 1 tablet (25 mg total) by mouth daily.   losartan (COZAAR) 100 MG  tablet TAKE 1 TABLET BY MOUTH EVERY DAY   metFORMIN (GLUCOPHAGE) 1000 MG tablet TAKE 1 TABLET BY MOUTH TWICE A DAY WITH FOOD   nitroGLYCERIN (NITROSTAT) 0.4 MG SL tablet Place 1 tablet (0.4 mg total) under the tongue every 5 (five) minutes as needed for chest pain.   ONETOUCH DELICA LANCETS 95J MISC Use to check BG 1 to 2 times daily.  Dx:  E11.9   pantoprazole (PROTONIX) 40 MG tablet Take 1 tablet (40 mg total) by mouth daily.   rosuvastatin (CRESTOR) 10 MG tablet Take 1 tablet (10 mg total) by mouth daily.   No facility-administered encounter medications on file as of 04/05/2021.    Past Surgical History:  Procedure Laterality Date   ANTERIOR CRUCIATE LIGAMENT REPAIR Left 2012   Knee    Family History  Problem Relation Age of Onset   Hypertension Father    Diabetes Father    Heart attack Mother    Breast cancer Mother    Diabetes Paternal Grandfather    Colon cancer Neg Hx    Esophageal cancer Neg Hx    Rectal cancer Neg Hx    Stomach cancer Neg Hx    Prostate cancer Neg Hx    Pancreatic cancer Neg Hx       Controlled substance contract: ***     Review of Systems     Objective:   Physical Exam  Assessment & Plan:

## 2021-04-06 ENCOUNTER — Encounter: Payer: Self-pay | Admitting: Nurse Practitioner

## 2021-05-03 ENCOUNTER — Encounter: Payer: Self-pay | Admitting: Nurse Practitioner

## 2021-05-03 ENCOUNTER — Ambulatory Visit: Payer: BC Managed Care – PPO | Admitting: Nurse Practitioner

## 2021-05-03 VITALS — BP 127/74 | HR 71 | Temp 98.0°F | Resp 20 | Ht 70.0 in | Wt 258.0 lb

## 2021-05-03 DIAGNOSIS — K219 Gastro-esophageal reflux disease without esophagitis: Secondary | ICD-10-CM | POA: Diagnosis not present

## 2021-05-03 DIAGNOSIS — E785 Hyperlipidemia, unspecified: Secondary | ICD-10-CM

## 2021-05-03 DIAGNOSIS — E119 Type 2 diabetes mellitus without complications: Secondary | ICD-10-CM

## 2021-05-03 DIAGNOSIS — I10 Essential (primary) hypertension: Secondary | ICD-10-CM

## 2021-05-03 DIAGNOSIS — F3342 Major depressive disorder, recurrent, in full remission: Secondary | ICD-10-CM

## 2021-05-03 DIAGNOSIS — E1169 Type 2 diabetes mellitus with other specified complication: Secondary | ICD-10-CM | POA: Diagnosis not present

## 2021-05-03 DIAGNOSIS — Z23 Encounter for immunization: Secondary | ICD-10-CM

## 2021-05-03 DIAGNOSIS — F419 Anxiety disorder, unspecified: Secondary | ICD-10-CM

## 2021-05-03 DIAGNOSIS — Z125 Encounter for screening for malignant neoplasm of prostate: Secondary | ICD-10-CM

## 2021-05-03 DIAGNOSIS — E349 Endocrine disorder, unspecified: Secondary | ICD-10-CM

## 2021-05-03 DIAGNOSIS — R194 Change in bowel habit: Secondary | ICD-10-CM

## 2021-05-03 DIAGNOSIS — G4733 Obstructive sleep apnea (adult) (pediatric): Secondary | ICD-10-CM

## 2021-05-03 DIAGNOSIS — Z720 Tobacco use: Secondary | ICD-10-CM

## 2021-05-03 LAB — LIPID PANEL

## 2021-05-03 LAB — BAYER DCA HB A1C WAIVED: HB A1C (BAYER DCA - WAIVED): 8.3 % — ABNORMAL HIGH (ref 4.8–5.6)

## 2021-05-03 MED ORDER — ROSUVASTATIN CALCIUM 10 MG PO TABS: 10.0000 mg | ORAL_TABLET | Freq: Every day | ORAL | 1 refills | Status: DC

## 2021-05-03 MED ORDER — FENOFIBRATE 145 MG PO TABS: 145.0000 mg | ORAL_TABLET | Freq: Every day | ORAL | 1 refills | Status: DC

## 2021-05-03 MED ORDER — PANTOPRAZOLE SODIUM 40 MG PO TBEC: 40.0000 mg | DELAYED_RELEASE_TABLET | Freq: Every day | ORAL | 1 refills | Status: DC

## 2021-05-03 MED ORDER — GLIMEPIRIDE 2 MG PO TABS: 2.0000 mg | ORAL_TABLET | Freq: Every day | ORAL | 1 refills | Status: DC

## 2021-05-03 MED ORDER — ESCITALOPRAM OXALATE 10 MG PO TABS: ORAL_TABLET | ORAL | 1 refills | Status: DC

## 2021-05-03 MED ORDER — LOSARTAN POTASSIUM 100 MG PO TABS: ORAL_TABLET | ORAL | 1 refills | Status: DC

## 2021-05-03 MED ORDER — METFORMIN HCL 1000 MG PO TABS: ORAL_TABLET | ORAL | 1 refills | Status: DC

## 2021-05-03 MED ORDER — HYDROCHLOROTHIAZIDE 25 MG PO TABS: 25.0000 mg | ORAL_TABLET | Freq: Every day | ORAL | 1 refills | Status: DC

## 2021-05-03 NOTE — Progress Notes (Signed)
Subjective:    Patient ID: Clayton [name], male    DOB: 09/23/1960, 61 y.o.   MRN: [id number]   Chief Complaint: medical management of chronic issues     HPI:  Clayton [name] is a 61 y.o. who identifies as a male who was assigned male at birth.   Social history: Lives with: wife Work history: owns [employer]   Comes in today for follow up of the following chronic medical issues:  1. Essential hypertension, benign No c/o chest pain, sob or headache. Does not check blood pressure at home. BP Readings from Last 3 Encounters:  01/11/21 132/77  09/14/20 129/73  04/13/20 137/74     2. Hyperlipidemia associated with type 2 diabetes mellitus ([place]) Does not watch diet and does no dedicate dexercise. Lab Results  Component Value Date   CHOL 141 01/11/2021   HDL 28 (L) 01/11/2021   LDLCALC 85 01/11/2021   LDLDIRECT 70 09/24/2014   TRIG 162 (H) 01/11/2021   CHOLHDL 5.0 01/11/2021   The 10-year ASCVD risk score (Arnett DK, et al., 2019) is: 29.1%   3. Type 2 diabetes mellitus without complication, without long-term current use of insulin (HCC) Fasting blood sugras have been running in 120-140. Denies any low blood sugars. Lab Results  Component Value Date   HGBA1C 7.6 (H) 01/11/2021     4. Gastroesophageal reflux disease without esophagitis Is on protonix daily and is doing well.  5. OSA (obstructive sleep apnea) Wears cpap nightly  6. Anxiety Is on lexapro and is doing well.  GAD 7 : Generalized Anxiety Score 05/03/2021 01/11/2021 09/14/2020 04/13/2020  Nervous, Anxious, on Edge 0 0 0 0  Control/stop worrying 0 0 0 0  Worry too much - different things 0 0 0 0  Trouble relaxing 0 0 1 0  Restless 0 0 1 1  Easily annoyed or irritable 0 0 0 0  Afraid - awful might happen 0 0 0 0  Total GAD 7 Score 0 0 2 1  Anxiety Difficulty Not difficult at all Not difficult at all Not difficult at all Not difficult at all      7. Recurrent major depressive disorder,  in full remission ([place]) Again is on lexaproa and is doing well. Depression screen [facility] 2/9 05/03/2021 01/11/2021 09/14/2020  Decreased Interest 0 0 0  Down, Depressed, Hopeless 0 0 0  PHQ - 2 Score 0 0 0  Altered sleeping 0 0 1  Tired, decreased energy 0 1 0  Change in appetite 0 0 0  Feeling bad or failure about yourself  0 0 0  Trouble concentrating 0 0 0  Moving slowly or fidgety/restless 0 0 0  Suicidal thoughts 0 0 0  PHQ-9 Score 0 1 1  Difficult doing work/chores Not difficult at all Not difficult at all Not difficult at all     8. Testosterone deficiency Lab Results  Component Value Date   TESTOSTERONE 351 01/11/2021     9. Tobacco use Smokes about a pack a day. He has had a recent chest xray in 2021 and was clear ,but has never had a low dose CT scan.  10. Morbid obesity ([place]) No recent weight changes Wt Readings from Last 3 Encounters:  05/03/21 258 lb (117 kg)  01/11/21 251 lb (113.9 kg)  09/14/20 252 lb (114.3 kg)   BMI Readings from Last 3 Encounters:  05/03/21 37.02 kg/m  01/11/21 36.01 kg/m  09/14/20 36.16 kg/m     New complaints:  None today  No Known Allergies Outpatient Encounter Medications as of 05/03/2021  Medication Sig   aspirin 81 MG EC tablet Take 1 tablet (81 mg total) by mouth daily. Swallow whole.   escitalopram (LEXAPRO) 10 MG tablet TAKE 1 TABLET BY MOUTH EVERY DAY   fenofibrate (TRICOR) 145 MG tablet Take 1 tablet (145 mg total) by mouth daily.   glimepiride (AMARYL) 2 MG tablet Take 1 tablet (2 mg total) by mouth daily before breakfast.   glucose blood (ONETOUCH VERIO) test strip Use to check BG 1 to 2 times daily Dx: E11.9   hydrochlorothiazide (HYDRODIURIL) 25 MG tablet Take 1 tablet (25 mg total) by mouth daily.   losartan (COZAAR) 100 MG tablet TAKE 1 TABLET BY MOUTH EVERY DAY   metFORMIN (GLUCOPHAGE) 1000 MG tablet TAKE 1 TABLET BY MOUTH TWICE A DAY WITH FOOD   nitroGLYCERIN (NITROSTAT) 0.4 MG SL tablet Place 1 tablet (0.4 mg  total) under the tongue every 5 (five) minutes as needed for chest pain.   ONETOUCH DELICA LANCETS 41L MISC Use to check BG 1 to 2 times daily.  Dx:  E11.9   pantoprazole (PROTONIX) 40 MG tablet Take 1 tablet (40 mg total) by mouth daily.   rosuvastatin (CRESTOR) 10 MG tablet Take 1 tablet (10 mg total) by mouth daily.   No facility-administered encounter medications on file as of 05/03/2021.    Past Surgical History:  Procedure Laterality Date   ANTERIOR CRUCIATE LIGAMENT REPAIR Left 2012   Knee    Family History  Problem Relation Age of Onset   Hypertension Father    Diabetes Father    Heart attack Mother    Breast cancer Mother    Diabetes Paternal Grandfather    Colon cancer Neg Hx    Esophageal cancer Neg Hx    Rectal cancer Neg Hx    Stomach cancer Neg Hx    Prostate cancer Neg Hx    Pancreatic cancer Neg Hx       Controlled substance contract: n/a     Review of Systems  Constitutional:  Negative for diaphoresis.  Eyes:  Negative for pain.  Respiratory:  Negative for shortness of breath.   Cardiovascular:  Negative for chest pain, palpitations and leg swelling.  Gastrointestinal:  Negative for abdominal pain.  Endocrine: Negative for polydipsia.  Skin:  Negative for rash.  Neurological:  Negative for dizziness, weakness and headaches.  Hematological:  Does not bruise/bleed easily.  All other systems reviewed and are negative.     Objective:   Physical Exam Vitals and nursing note reviewed.  Constitutional:      Appearance: Normal appearance. He is well-developed.  HENT:     Head: Normocephalic.     Nose: Nose normal.     Mouth/Throat:     Mouth: Mucous membranes are moist.     Pharynx: Oropharynx is clear.  Eyes:     Pupils: Pupils are equal, round, and reactive to light.  Neck:     Thyroid: No thyroid mass or thyromegaly.     Vascular: No carotid bruit or JVD.     Trachea: Phonation normal.  Cardiovascular:     Rate and Rhythm: Normal rate and  regular rhythm.  Pulmonary:     Effort: Pulmonary effort is normal. No respiratory distress.     Breath sounds: Normal breath sounds.  Abdominal:     General: Bowel sounds are normal.     Palpations: Abdomen is soft.     Tenderness: There  is no abdominal tenderness.  Musculoskeletal:        General: Normal range of motion.     Cervical back: Normal range of motion and neck supple.  Lymphadenopathy:     Cervical: No cervical adenopathy.  Skin:    General: Skin is warm and dry.  Neurological:     Mental Status: He is alert and oriented to person, place, and time.  Psychiatric:        Behavior: Behavior normal.        Thought Content: Thought content normal.        Judgment: Judgment normal.   BP 127/74    Pulse 71    Temp 98 F (36.7 C) (Temporal)    Resp 20    Ht $R'5\' 10"'vm$  (1.778 m)    Wt 258 lb (117 kg)    SpO2 99%    BMI 37.02 kg/m   Hgba1c 8.3%      Assessment & Plan:  NIALL ILLES comes in today with chief complaint of Medical Management of Chronic Issues   Diagnosis and orders addressed:  1. Essential hypertension, benign Low sodium diet - CBC with Differential/Platelet - CMP14+EGFR - hydrochlorothiazide (HYDRODIURIL) 25 MG tablet; Take 1 tablet (25 mg total) by mouth daily.  Dispense: 90 tablet; Refill: 1 - losartan (COZAAR) 100 MG tablet; TAKE 1 TABLET BY MOUTH EVERY DAY  Dispense: 90 tablet; Refill: 1  2. Hyperlipidemia associated with type 2 diabetes mellitus (HCC) Low fat diet - Lipid panel - fenofibrate (TRICOR) 145 MG tablet; Take 1 tablet (145 mg total) by mouth daily.  Dispense: 90 tablet; Refill: 1 - rosuvastatin (CRESTOR) 10 MG tablet; Take 1 tablet (10 mg total) by mouth daily.  Dispense: 90 tablet; Refill: 1  3. Type 2 diabetes mellitus without complication, without long-term current use of insulin (HCC) Stricter carb counting Patient did not want to change medication - say he can get hgba1c down on his own - Bayer DCA Hb A1c Waived -  Microalbumin / creatinine urine ratio - glimepiride (AMARYL) 2 MG tablet; Take 1 tablet (2 mg total) by mouth daily before breakfast.  Dispense: 90 tablet; Refill: 1 - metFORMIN (GLUCOPHAGE) 1000 MG tablet; TAKE 1 TABLET BY MOUTH TWICE A DAY WITH FOOD  Dispense: 180 tablet; Refill: 1  4. Gastroesophageal reflux disease without esophagitis Avoid spicy foods Do not eat 2 hours prior to bedtime - pantoprazole (PROTONIX) 40 MG tablet; Take 1 tablet (40 mg total) by mouth daily.  Dispense: 90 tablet; Refill: 1  5. OSA (obstructive sleep apnea) Continue to wear CPAP  6. Anxiety Stress management  7. Recurrent major depressive disorder, in full remission ([place]) - escitalopram (LEXAPRO) 10 MG tablet; TAKE 1 TABLET BY MOUTH EVERY DAY  Dispense: 90 tablet; Refill: 1  8. Testosterone deficiency   9. Tobacco use Smoking cessation encouraged  10. Morbid obesity ([place]) Discussed diet and exercise for person with BMI >25 Will recheck weight in 3-6 months  11 change in bowel habits Referral to GI  Labs pending Health Maintenance reviewed Diet and exercise encouraged  Follow up plan: 3 months   Mary-Margaret Hassell Done, FNP

## 2021-05-03 NOTE — Addendum Note (Signed)
Addended by: Rolena Infante on: 05/03/2021 04:27 PM   Modules accepted: Orders

## 2021-05-03 NOTE — Patient Instructions (Signed)
Managing the Challenge of Quitting Smoking ?Quitting smoking is a physical and mental challenge. You will face cravings, withdrawal symptoms, and temptation. Before quitting, work with your health care provider to make a plan that can help you manage quitting. Preparation can help you quit and keep you from giving in. ?How to manage lifestyle changes ?Managing stress ?Stress can make you want to smoke, and wanting to smoke may cause stress. It is important to find ways to manage your stress. You might try some of the following: ?Practice relaxation techniques. ?Breathe slowly and deeply, in through your nose and out through your mouth. ?Listen to music. ?Soak in a bath or take a shower. ?Imagine a peaceful place or vacation. ?Get some support. ?Talk with family or friends about your stress. ?Join a support group. ?Talk with a counselor or therapist. ?Get some physical activity. ?Go for a walk, run, or bike ride. ?Play a favorite sport. ?Practice yoga. ? ?Medicines ?Talk with your health care provider about medicines that might help you deal with cravings and make quitting easier for you. ?Relationships ?Social situations can be difficult when you are quitting smoking. To manage this, you can: ?Avoid parties and other social situations where people might be smoking. ?Avoid alcohol. ?Leave right away if you have the urge to smoke. ?Explain to your family and friends that you are quitting smoking. Ask for support and let them know you might be a bit grumpy. ?Plan activities where smoking is not an option. ?General instructions ?Be aware that many people gain weight after they quit smoking. However, not everyone does. To keep from gaining weight, have a plan in place before you quit and stick to the plan after you quit. Your plan should include: ?Having healthy snacks. When you have a craving, it may help to: ?Eat popcorn, carrots, celery, or other cut vegetables. ?Chew sugar-free gum. ?Changing how you eat. ?Eat small  portion sizes at meals. ?Eat 4-6 small meals throughout the day instead of 1-2 large meals a day. ?Be mindful when you eat. Do not watch television or do other things that might distract you as you eat. ?Exercising regularly. ?Make time to exercise each day. If you do not have time for a long workout, do short bouts of exercise for 5-10 minutes several times a day. ?Do some form of strengthening exercise, such as weight lifting. ?Do some exercise that gets your heart beating and causes you to breathe deeply, such as walking fast, running, swimming, or biking. This is very important. ?Drinking plenty of water or other low-calorie or no-calorie drinks. Drink 6-8 glasses of water daily. ? ?How to recognize withdrawal symptoms ?Your body and mind may experience discomfort as you try to get used to not having nicotine in your system. These effects are called withdrawal symptoms. They may include: ?Feeling hungrier than normal. ?Having trouble concentrating. ?Feeling irritable or restless. ?Having trouble sleeping. ?Feeling depressed. ?Craving a cigarette. ?To manage withdrawal symptoms: ?Avoid places, people, and activities that trigger your cravings. ?Remember why you want to quit. ?Get plenty of sleep. ?Avoid coffee and other caffeinated drinks. These may worsen some of your symptoms. ?These symptoms may surprise you. But be assured that they are normal to have when quitting smoking. ?How to manage cravings ?Come up with a plan for how to deal with your cravings. The plan should include the following: ?A definition of the specific situation you want to deal with. ?An alternative action you will take. ?A clear idea for how this action   will help. ?The name of someone who might help you with this. ?Cravings usually last for 5-10 minutes. Consider taking the following actions to help you with your plan to deal with cravings: ?Keep your mouth busy. ?Chew sugar-free gum. ?Suck on hard candies or a straw. ?Brush your  teeth. ?Keep your hands and body busy. ?Change to a different activity right away. ?Squeeze or play with a ball. ?Do an activity or a hobby, such as making bead jewelry, practicing needlepoint, or working with wood. ?Mix up your normal routine. ?Take a short exercise break. Go for a quick walk or run up and down stairs. ?Focus on doing something kind or helpful for someone else. ?Call a friend or family member to talk during a craving. ?Join a support group. ?Contact a quitline. ?Where to find support ?To get help or find a support group: ?Call the National Cancer Institute's Smoking Quitline: 1-800-QUIT NOW (784-8669) ?Visit the website of the Substance Abuse and Mental Health Services Administration: www.samhsa.gov ?Text QUIT to SmokefreeTXT: 478848 ?Where to find more information ?Visit these websites to find more information on quitting smoking: ?National Cancer Institute: www.smokefree.gov ?American Lung Association: www.lung.org ?American Cancer Society: www.cancer.org ?Centers for Disease Control and Prevention: www.cdc.gov ?American Heart Association: www.heart.org ?Contact a health care provider if: ?You want to change your plan for quitting. ?The medicines you are taking are not helping. ?Your eating feels out of control or you cannot sleep. ?Get help right away if: ?You feel depressed or become very anxious. ?Summary ?Quitting smoking is a physical and mental challenge. You will face cravings, withdrawal symptoms, and temptation to smoke again. Preparation can help you as you go through these challenges. ?Try different techniques to manage stress, handle social situations, and prevent weight gain. ?You can deal with cravings by keeping your mouth busy (such as by chewing gum), keeping your hands and body busy, calling family or friends, or contacting a quitline for people who want to quit smoking. ?You can deal with withdrawal symptoms by avoiding places where people smoke, getting plenty of rest, and  avoiding drinks with caffeine. ?This information is not intended to replace advice given to you by your health care provider. Make sure you discuss any questions you have with your health care provider. ?Document Revised: 11/13/2020 Document Reviewed: 12/25/2018 ?Elsevier Patient Education ? 2022 [employer] ? ?

## 2021-05-04 LAB — CMP14+EGFR
ALT: 14 IU/L (ref 0–44)
AST: 13 IU/L (ref 0–40)
Albumin/Globulin Ratio: 1.8 (ref 1.2–2.2)
Albumin: 4.4 g/dL (ref 3.8–4.9)
Alkaline Phosphatase: 67 IU/L (ref 44–121)
BUN/Creatinine Ratio: 20 (ref 9–20)
BUN: 18 mg/dL (ref 6–24)
Bilirubin Total: 0.6 mg/dL (ref 0.0–1.2)
CO2: 25 mmol/L (ref 20–29)
Calcium: 9.4 mg/dL (ref 8.7–10.2)
Chloride: 101 mmol/L (ref 96–106)
Creatinine, Ser: 0.89 mg/dL (ref 0.76–1.27)
Globulin, Total: 2.4 g/dL (ref 1.5–4.5)
Glucose: 161 mg/dL — ABNORMAL HIGH (ref 70–99)
Potassium: 4.7 mmol/L (ref 3.5–5.2)
Sodium: 141 mmol/L (ref 134–144)
Total Protein: 6.8 g/dL (ref 6.0–8.5)
eGFR: 99 mL/min/{1.73_m2} (ref >59)

## 2021-05-04 LAB — CBC WITH DIFFERENTIAL/PLATELET
Basophils Absolute: 0.1 10*3/uL (ref 0.0–0.2)
Basos: 1 %
EOS (ABSOLUTE): 0.5 10*3/uL — ABNORMAL HIGH (ref 0.0–0.4)
Eos: 4 %
Hematocrit: 39.9 % (ref 37.5–51.0)
Hemoglobin: 13.4 g/dL (ref 13.0–17.7)
Immature Grans (Abs): 0 10*3/uL (ref 0.0–0.1)
Immature Granulocytes: 0 %
Lymphocytes Absolute: 2.6 10*3/uL (ref 0.7–3.1)
Lymphs: 23 %
MCH: 30.5 pg (ref 26.6–33.0)
MCHC: 33.6 g/dL (ref 31.5–35.7)
MCV: 91 fL (ref 79–97)
Monocytes Absolute: 1 10*3/uL — ABNORMAL HIGH (ref 0.1–0.9)
Monocytes: 9 %
Neutrophils Absolute: 7.1 10*3/uL — ABNORMAL HIGH (ref 1.4–7.0)
Neutrophils: 63 %
Platelets: 232 10*3/uL (ref 150–450)
RBC: 4.4 x10E6/uL (ref 4.14–5.80)
RDW: 12.4 % (ref 11.6–15.4)
WBC: 11.3 10*3/uL — ABNORMAL HIGH (ref 3.4–10.8)

## 2021-05-04 LAB — MICROALBUMIN / CREATININE URINE RATIO
Creatinine, Urine: 96.8 mg/dL
Microalb/Creat Ratio: 14 mg/g creat (ref 0–29)
Microalbumin, Urine: 13.2 ug/mL

## 2021-05-04 LAB — LIPID PANEL
Chol/HDL Ratio: 4.5 ratio (ref 0.0–5.0)
Cholesterol, Total: 113 mg/dL (ref 100–199)
HDL: 25 mg/dL — ABNORMAL LOW (ref >39)
LDL Chol Calc (NIH): 55 mg/dL (ref 0–99)
Triglycerides: 200 mg/dL — ABNORMAL HIGH (ref 0–149)
VLDL Cholesterol Cal: 33 mg/dL (ref 5–40)

## 2021-05-04 LAB — PSA, TOTAL AND FREE
PSA, Free Pct: 18.6 %
PSA, Free: 0.26 ng/mL
Prostate Specific Ag, Serum: 1.4 ng/mL (ref 0.0–4.0)

## 2021-05-05 ENCOUNTER — Encounter: Payer: Self-pay | Admitting: Nurse Practitioner

## 2021-05-18 ENCOUNTER — Encounter: Payer: Self-pay | Admitting: Nurse Practitioner

## 2021-05-18 ENCOUNTER — Ambulatory Visit: Payer: BC Managed Care – PPO | Admitting: Nurse Practitioner

## 2021-05-18 VITALS — BP 124/80 | HR 84 | Ht 70.0 in | Wt 257.0 lb

## 2021-05-18 DIAGNOSIS — K6289 Other specified diseases of anus and rectum: Secondary | ICD-10-CM

## 2021-05-18 DIAGNOSIS — K219 Gastro-esophageal reflux disease without esophagitis: Secondary | ICD-10-CM | POA: Diagnosis not present

## 2021-05-18 DIAGNOSIS — G4733 Obstructive sleep apnea (adult) (pediatric): Secondary | ICD-10-CM | POA: Diagnosis not present

## 2021-05-18 DIAGNOSIS — Z86010 Personal history of colonic polyps: Secondary | ICD-10-CM | POA: Diagnosis not present

## 2021-05-18 NOTE — Progress Notes (Signed)
05/18/2021 NASIAH LEHENBAUER [id number] [birth year]   CHIEF COMPLAINT: Schedule an upper endoscopy/colonoscopy, rectal pain and acid reflux  HISTORY OF PRESENT ILLNESS: Sonia Side L. Glazier is a 61 year old male with a past medical history of anxiety, depression, hypertension, hyperlipidemia, smoker, DM II, obesity, sleep apnea use bipap, GERD and colon polyps.  He presents to our office today as referred by Chevis Pretty NP for further evaluation regarding rectal pain. He complains of having rectal pain twice monthly for the past 2 to 3 months which lasts up to 45 minutes at a time.  His episodes of rectal pain occur randomly during the day and has awakened him on several occasions from sleep at nighttime.  He denies experience any anorectal pain during defecation.  He sometimes feels he needs to have a bowel movement but does not pass any stool.  No abdominal pain.  No rectal bleeding or purulent discharge.  No mucus per the rectum.  He last experienced rectal pain 2 weeks ago.  He is passing a normal brown formed bowel movement most days but can skip 1 or 2 days without passing a bowel movement then passes 2 stools in one day.  He does not feel constipated.  He underwent a colonoscopy 02/16/2015 by Dr. Carlean Purl which identified diverticulosis in the left colon, a 2-3 cm right colon lipoma, 3 polyps (2 tubular adenomas and one polyp was benign colonic mucosa) were removed from the distal transverse colon and a hyperplastic polyp was removed from the rectum.  At that time, he was advised to repeat a colonoscopy in 5 years.  He complains of having acid reflux approximately 3 days weekly for the past year.  He describes feeling a burning discomfort in his throat and sometimes feels choked but food does not get stuck in his throat or esophagus.  Sometimes when he swallows water he feels like it goes down the wrong pipe.  He sometimes feels as if he has mucus in the back of his throat.  He is taking  Pantoprazole 40 mg daily.  He is on ASA 81 mg daily.  No other NSAIDs.  He wishes to schedule an upper endoscopy at the time of his colonoscopy.  He was seen by cardiologist Dr. Harriet Masson 01/20/2020 due to having chest pain and dizziness.  A coronary CT scan done 03/03/2020 showed minimal CAD.  An echo showed LVEF 60 to 65% with grade 1 diastolic dysfunction. Seven day Zio monitor was unremarkable.  He denies having any further chest pain since his cardiac evaluation Nov - Dec. 2021.   CBC Latest Ref Rng & Units 05/03/2021 01/11/2021 09/14/2020  WBC 3.4 - 10.8 x10E3/uL 11.3(H) 9.3 9.8  Hemoglobin 13.0 - 17.7 g/dL 13.4 13.9 14.3  Hematocrit 37.5 - 51.0 % 39.9 42.8 41.9  Platelets 150 - 450 x10E3/uL 232 258 240    CMP Latest Ref Rng & Units 05/03/2021 01/11/2021 09/14/2020  Glucose 70 - 99 mg/dL 161(H) 162(H) 151(H)  BUN 6 - 24 mg/dL 18 16 19   Creatinine 0.76 - 1.27 mg/dL 0.89 0.89 0.89  Sodium 134 - 144 mmol/L 141 137 139  Potassium 3.5 - 5.2 mmol/L 4.7 4.7 4.6  Chloride 96 - 106 mmol/L 101 97 100  CO2 20 - 29 mmol/L 25 26 23   Calcium 8.7 - 10.2 mg/dL 9.4 9.7 9.5  Total Protein 6.0 - 8.5 g/dL 6.8 7.2 7.2  Total Bilirubin 0.0 - 1.2 mg/dL 0.6 0.8 0.7  Alkaline Phos 44 - 121 IU/L  67 67 71  AST 0 - 40 IU/L 13 12 11   ALT 0 - 44 IU/L 14 13 16      Coronary CT 03/03/2020: 1. Coronary calcium score of 3. This was 68 percentile for age and sex matched control.   2. Normal coronary origin with right dominance.   3. Minimal CAD. CADRADS1. Aggressive medical therapy for primary Prevention  ECHO 02/07/2020:  1. Left ventricular ejection fraction, by estimation, is 60 to 65%. The left ventricle has normal function. The left ventricle has no regional wall motion abnormalities. Left ventricular diastolic parameters are consistent with Grade I diastolic dysfunction (impaired relaxation). 2. Right ventricular systolic function is normal. The right ventricular size is normal. 3. The mitral valve is normal  in structure. No evidence of mitral valve regurgitation. No evidence of mitral stenosis. 4. The aortic valve is tricuspid. Aortic valve regurgitation is not visualized. No aortic stenosis is present. 5. The inferior vena cava is normal in size with greater than 50% respiratory variability, suggesting right atrial pressure of 3 mmHg.  Colonoscopy 02/16/2015 by Dr. Carlean Purl: 1. Three sessile polyps ranging from 3 to 2mm in size were found in the distal transverse colon; polypectomies were performed with a cold snare 2. Sessile polyp was found in the rectum; polypectomy was performed with a cold snare 3. Moderate diverticulosis was noted in the left colon Page 1 of 2 4. 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 The examination was otherwise normal -Repeat colonoscopy 2021-2022 1. Surgical [P], descending, polyp (3) - TUBULAR ADENOMA (X2). - BENIGN COLONIC MUCOSA (X1). - NO HIGH GRADE DYSPLASIA OR MALIGNANCY. 2. Surgical [P], rectum, polyp - HYPERPLASTIC POLYP. - NO DYSPLASIA OR MALIGNANCY  Past Medical History:  Diagnosis Date   Allergy    Anxiety    Depression    Essential hypertension, benign 06/14/2012   GERD (gastroesophageal reflux disease)    High cholesterol    Hx of adenomatous colonic polyps 02/16/2015   Hyperlipidemia associated with type 2 diabetes mellitus ([place]) 06/14/2012   Hypertension    Morbid obesity ([place]) 08/08/2014   OSA (obstructive sleep apnea) 03/01/2013   Sleep apnea    wears B-PAP   Testosterone deficiency    Type 2 diabetes mellitus ([place]) 05/09/2014   Past Surgical History:  Procedure Laterality Date   ANTERIOR CRUCIATE LIGAMENT REPAIR Left 2012   Knee   Social History: He smokes cigarettes 1ppd since age 44. He drinks 3 beers weekly. No drug use.   Family History: Mother had breast cancer and MI.  Father had skin cancer, diabetes. Paternal grandfather had diabetes.   No Known Allergies   Outpatient Encounter Medications as of 05/18/2021  Medication Sig    aspirin 81 MG EC tablet Take 1 tablet (81 mg total) by mouth daily. Swallow whole.   escitalopram (LEXAPRO) 10 MG tablet TAKE 1 TABLET BY MOUTH EVERY DAY   fenofibrate (TRICOR) 145 MG tablet Take 1 tablet (145 mg total) by mouth daily.   glimepiride (AMARYL) 2 MG tablet Take 1 tablet (2 mg total) by mouth daily before breakfast.   glucose blood (ONETOUCH VERIO) test strip Use to check BG 1 to 2 times daily Dx: E11.9   hydrochlorothiazide (HYDRODIURIL) 25 MG tablet Take 1 tablet (25 mg total) by mouth daily.   losartan (COZAAR) 100 MG tablet TAKE 1 TABLET BY MOUTH EVERY DAY   metFORMIN (GLUCOPHAGE) 1000 MG tablet TAKE 1 TABLET BY MOUTH TWICE A DAY WITH FOOD   nitroGLYCERIN (NITROSTAT) 0.4 MG SL tablet  Place 1 tablet (0.4 mg total) under the tongue every 5 (five) minutes as needed for chest pain.   ONETOUCH DELICA LANCETS 83M MISC Use to check BG 1 to 2 times daily.  Dx:  E11.9   pantoprazole (PROTONIX) 40 MG tablet Take 1 tablet (40 mg total) by mouth daily.   rosuvastatin (CRESTOR) 10 MG tablet Take 1 tablet (10 mg total) by mouth daily.   No facility-administered encounter medications on file as of 05/18/2021.    REVIEW OF SYSTEMS:  Gen: Denies fever, sweats or chills. No weight loss.  CV: See HPI.  Denies chest pain, palpitations or edema. Resp: + cough. No shortness of breath of hemoptysis.  GI:See HPI. GU : + Urine leakage, patient intends to see urology.  MS: Denies joint pain, muscles aches or weakness. Derm: Chronic posterior scalp lesions which intermittently bleed. Psych: Denies depression, anxiety, memory loss, suicidal ideation and confusion. Heme: Denies bruising, easy bleeding. Neuro:  Denies headaches, dizziness or paresthesias. Endo:  Denies any problems with DM, thyroid or adrenal function.  PHYSICAL EXAM: BP 124/80 (BP Location: Left Arm, Patient Position: Sitting, Cuff Size: Large)    Pulse 84    Ht 5\' 10"  (1.778 m)    Wt 257 lb (116.6 kg)    BMI 36.88 kg/m    General: 61 year old male in no acute distress. Head: Normocephalic and atraumatic. Eyes:  Sclerae non-icteric, conjunctive pink. Ears: Normal auditory acuity. Mouth: Dentition intact. No ulcers or lesions.  Neck: Supple, no lymphadenopathy or thyromegaly.  Lungs: Clear bilaterally to auscultation without wheezes, crackles or rhonchi. Heart: Regular rate and rhythm. Soft systolic murmur. No rub or gallop appreciated.  Abdomen: Soft, nontender, non distended. No masses. No hepatosplenomegaly. Normoactive bowel sounds x 4 quadrants.  Rectal: Patient declined exam, he prefers to proceed with a colonoscopy for further evaluation. Musculoskeletal: Symmetrical with no gross deformities. Skin: Warm and dry. No rash or lesions on visible extremities. Extremities: No edema. Neurological: Alert oriented x 4, no focal deficits.  Psychological:  Alert and cooperative. Normal mood and affect.  ASSESSMENT AND PLAN:  65) 61 year old male with rectal pain, no rectal bleeding.  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  -Colonoscopy to rule out colorectal cancer/rectal ulcers, benefits and risks discussed including risk with sedation, risk of bleeding, perforation and infection  -Patient will contact our office if his rectal pain worsens prior to his colonoscopy date  2) GERD on Pantoprazole 40mg  QD -GERD diet discussed -EGD benefits and risks discussed including risk with sedation, risk of bleeding, perforation and infection   3) History of tubular adenomatous and hyperplastic polyps per colonoscopy 01/2015 -Colonoscopy as ordered above  4) DM II  5) Chronic tobacco use -Smoking cessation encouraged  6) history of sleep apnea uses BiPAP  Further recommendations to be determined after EGD and colonoscopy completed  CC:  Hassell Done, Mary-Margaret, *

## 2021-05-18 NOTE — Patient Instructions (Addendum)
You have been scheduled for an EGD and Colonoscopy. Please follow the written instructions given to you at your visit today. If you use inhalers (even only as needed), please bring them with you on the day of your procedure.  Please contact our office if rectal pain worsens.  Thank you for trusting me with your gastrointestinal care!    Noralyn Pick, CRNP    BMI:  If you are age 60 or older, your body mass index should be between 23-30. Your Body mass index is 36.88 kg/m. If this is out of the aforementioned range listed, please consider follow up with your Primary Care Provider.  If you are age 44 or younger, your body mass index should be between 19-25. Your Body mass index is 36.88 kg/m. If this is out of the aformentioned range listed, please consider follow up with your Primary Care Provider.   MY CHART:  The [facility] providers would like to encourage you to use [facility] to communicate with providers for non-urgent requests or questions.  Due to long hold times on the telephone, sending your provider a message by [facility] may be a faster and more efficient way to get a response.  Please allow 48 business hours for a response.  Please remember that this is for non-urgent requests.

## 2021-06-28 ENCOUNTER — Ambulatory Visit (AMBULATORY_SURGERY_CENTER): Payer: BC Managed Care – PPO | Admitting: Internal Medicine

## 2021-06-28 ENCOUNTER — Encounter: Payer: Self-pay | Admitting: Internal Medicine

## 2021-06-28 VITALS — BP 134/69 | HR 63 | Temp 99.3°F | Resp 14 | Ht 70.0 in | Wt 257.0 lb

## 2021-06-28 DIAGNOSIS — Z86010 Personal history of colonic polyps: Secondary | ICD-10-CM

## 2021-06-28 DIAGNOSIS — R053 Chronic cough: Secondary | ICD-10-CM | POA: Diagnosis not present

## 2021-06-28 DIAGNOSIS — K319 Disease of stomach and duodenum, unspecified: Secondary | ICD-10-CM | POA: Diagnosis not present

## 2021-06-28 DIAGNOSIS — K31819 Angiodysplasia of stomach and duodenum without bleeding: Secondary | ICD-10-CM | POA: Diagnosis not present

## 2021-06-28 DIAGNOSIS — K219 Gastro-esophageal reflux disease without esophagitis: Secondary | ICD-10-CM | POA: Diagnosis not present

## 2021-06-28 MED ORDER — SODIUM CHLORIDE 0.9 % IV SOLN: 500.0000 mL | Freq: Once | INTRAVENOUS | Status: DC

## 2021-06-28 NOTE — Progress Notes (Signed)
A and O x3. Report to RN. Tolerated MAC anesthesia well.  ?

## 2021-06-28 NOTE — Progress Notes (Signed)
[facility] History and Physical ? ? ?Primary Care Physician:  Chevis Pretty, FNP ? ? ?Reason for Procedure:   GERD, hx colon polyps ? ?Plan:    EGD, colonoscopy ? ? ? ? ?HPI: Clayton [name] is a 61 y.o. male seen by [place] Daywalt Best NP as below - in late February. He has GERD sxs and hx colon polyps and rectal pain issues. ? ?with a past medical history of anxiety, depression, hypertension, hyperlipidemia, smoker, DM II, obesity, sleep apnea use bipap, GERD and colon polyps. ?  ?He presents to our office today as referred by Chevis Pretty NP for further evaluation regarding rectal pain. He complains of having rectal pain twice monthly for the past 2 to 3 months which lasts up to 45 minutes at a time.  His episodes of rectal pain occur randomly during the day and has awakened him on several occasions from sleep at nighttime.  He denies experience any anorectal pain during defecation.  He sometimes feels he needs to have a bowel movement but does not pass any stool.  No abdominal pain.  No rectal bleeding or purulent discharge.  No mucus per the rectum.  He last experienced rectal pain 2 weeks ago.  He is passing a normal brown formed bowel movement most days but can skip 1 or 2 days without passing a bowel movement then passes 2 stools in one day.  He does not feel constipated.  He underwent a colonoscopy 02/16/2015 by Dr. Carlean Purl which identified diverticulosis in the left colon, a 2-3 cm right colon lipoma, 3 polyps (2 tubular adenomas and one polyp was benign colonic mucosa) were removed from the distal transverse colon and a hyperplastic polyp was removed from the rectum.  At that time, he was advised to repeat a colonoscopy in 5 years. ?  ?He complains of having acid reflux approximately 3 days weekly for the past year.  He describes feeling a burning discomfort in his throat and sometimes feels choked but food does not get stuck in his throat or esophagus.  Sometimes when he  swallows water he feels like it goes down the wrong pipe.  He sometimes feels as if he has mucus in the back of his throat.  He is taking Pantoprazole 40 mg daily.  He is on ASA 81 mg daily.  No other NSAIDs.  He wishes to schedule an upper endoscopy at the time of his colonoscopy. ?  ?He was seen by cardiologist Dr. Harriet Masson 01/20/2020 due to having chest pain and dizziness.  A coronary CT scan done 03/03/2020 showed minimal CAD.  An echo showed LVEF 60 to 65% with grade 1 diastolic dysfunction. Seven day Zio monitor was unremarkable.  He denies having any further chest pain since his cardiac evaluation Nov - Dec. 2021.  ?  ? ?Past Medical History:  ?Diagnosis Date  ? Allergy   ? Anxiety   ? Depression   ? Essential hypertension, benign 06/14/2012  ? GERD (gastroesophageal reflux disease)   ? High cholesterol   ? Hx of adenomatous colonic polyps 02/16/2015  ? Hyperlipidemia associated with type 2 diabetes mellitus ([place]) 06/14/2012  ? Hypertension   ? Morbid obesity ([place]) 08/08/2014  ? OSA (obstructive sleep apnea) 03/01/2013  ? Sleep apnea   ? wears B-PAP  ? Testosterone deficiency   ? Type 2 diabetes mellitus ([place]) 05/09/2014  ? ? ?Past Surgical History:  ?Procedure Laterality Date  ? ANTERIOR CRUCIATE LIGAMENT REPAIR Left 2012  ? Knee  ? COLONOSCOPY    ?  POLYPECTOMY    ? ? ?Prior to Admission medications   ?Medication Sig Start Date End Date Taking? Authorizing Provider  ?aspirin 81 MG EC tablet Take 1 tablet (81 mg total) by mouth daily. Swallow whole. 08/08/14  Yes Cherre Robins, RPH-CPP  ?escitalopram (LEXAPRO) 10 MG tablet TAKE 1 TABLET BY MOUTH EVERY DAY 05/03/21  Yes Chevis Pretty, FNP  ?fenofibrate (TRICOR) 145 MG tablet Take 1 tablet (145 mg total) by mouth daily. 05/03/21 08/01/21 Yes Chevis Pretty, FNP  ?glimepiride (AMARYL) 2 MG tablet Take 1 tablet (2 mg total) by mouth daily before breakfast. 05/03/21  Yes Chevis Pretty, FNP  ?glucose blood (ONETOUCH VERIO) test strip Use to check BG 1 to 2  times daily Dx: E11.9 05/18/18  Yes Chevis Pretty, FNP  ?hydrochlorothiazide (HYDRODIURIL) 25 MG tablet Take 1 tablet (25 mg total) by mouth daily. 05/03/21  Yes Chevis Pretty, FNP  ?losartan (COZAAR) 100 MG tablet TAKE 1 TABLET BY MOUTH EVERY DAY 05/03/21  Yes Chevis Pretty, FNP  ?metFORMIN (GLUCOPHAGE) 1000 MG tablet TAKE 1 TABLET BY MOUTH TWICE A DAY WITH FOOD 05/03/21  Yes Chevis Pretty, FNP  ?ONETOUCH DELICA LANCETS 51W MISC Use to check BG 1 to 2 times daily.  Dx:  E11.9 05/18/18  Yes Chevis Pretty, FNP  ?pantoprazole (PROTONIX) 40 MG tablet Take 1 tablet (40 mg total) by mouth daily. 05/03/21  Yes Chevis Pretty, FNP  ?rosuvastatin (CRESTOR) 10 MG tablet Take 1 tablet (10 mg total) by mouth daily. 05/03/21  Yes Hassell Done, Mary-Margaret, FNP  ?nitroGLYCERIN (NITROSTAT) 0.4 MG SL tablet Place 1 tablet (0.4 mg total) under the tongue every 5 (five) minutes as needed for chest pain. 01/20/20 04/19/20  Berniece Salines, DO  ? ? ?Current Outpatient Medications  ?Medication Sig Dispense Refill  ? aspirin 81 MG EC tablet Take 1 tablet (81 mg total) by mouth daily. Swallow whole. 30 tablet 12  ? escitalopram (LEXAPRO) 10 MG tablet TAKE 1 TABLET BY MOUTH EVERY DAY 90 tablet 1  ? fenofibrate (TRICOR) 145 MG tablet Take 1 tablet (145 mg total) by mouth daily. 90 tablet 1  ? glimepiride (AMARYL) 2 MG tablet Take 1 tablet (2 mg total) by mouth daily before breakfast. 90 tablet 1  ? glucose blood (ONETOUCH VERIO) test strip Use to check BG 1 to 2 times daily Dx: E11.9 200 each 3  ? hydrochlorothiazide (HYDRODIURIL) 25 MG tablet Take 1 tablet (25 mg total) by mouth daily. 90 tablet 1  ? losartan (COZAAR) 100 MG tablet TAKE 1 TABLET BY MOUTH EVERY DAY 90 tablet 1  ? metFORMIN (GLUCOPHAGE) 1000 MG tablet TAKE 1 TABLET BY MOUTH TWICE A DAY WITH FOOD 180 tablet 1  ? ONETOUCH DELICA LANCETS 25E MISC Use to check BG 1 to 2 times daily.  Dx:  E11.9 200 each 3  ? pantoprazole (PROTONIX) 40 MG tablet  Take 1 tablet (40 mg total) by mouth daily. 90 tablet 1  ? rosuvastatin (CRESTOR) 10 MG tablet Take 1 tablet (10 mg total) by mouth daily. 90 tablet 1  ? nitroGLYCERIN (NITROSTAT) 0.4 MG SL tablet Place 1 tablet (0.4 mg total) under the tongue every 5 (five) minutes as needed for chest pain. 25 tablet 11  ? ?Current Facility-Administered Medications  ?Medication Dose Route Frequency Provider Last Rate Last Admin  ? 0.9 %  sodium chloride infusion  500 mL Intravenous Once Gatha Mayer, MD      ? ? ?Allergies as of 06/28/2021  ? (No Known Allergies)  ? ? ?  Family History  ?Problem Relation Age of Onset  ? Hypertension Father   ? Diabetes Father   ? Heart attack Mother   ? Breast cancer Mother   ? Diabetes Paternal Grandfather   ? Colon cancer Neg Hx   ? Esophageal cancer Neg Hx   ? Rectal cancer Neg Hx   ? Stomach cancer Neg Hx   ? Prostate cancer Neg Hx   ? Pancreatic cancer Neg Hx   ? ? ?Social History  ? ?Socioeconomic History  ? Marital status: Married  ?  Spouse name: Not on file  ? Number of children: Not on file  ? Years of education: Not on file  ? Highest education level: Not on file  ?Occupational History  ? Not on file  ?Tobacco Use  ? Smoking status: Every Day  ?  Packs/day: 0.50  ?  Years: 35.00  ?  Pack years: 17.50  ?  Types: Cigarettes  ? Smokeless tobacco: Current  ?  Types: Snuff  ? Tobacco comments:  ?  I dip here and there  ?Vaping Use  ? Vaping Use: Never used  ?Substance and Sexual Activity  ? Alcohol use: Yes  ?  Alcohol/week: 0.0 standard drinks  ?  Comment: occ  ? Drug use: No  ? Sexual activity: Not on file  ?Other Topics Concern  ? Not on file  ?Social History Narrative  ? Not on file  ? ?Social Determinants of Health  ? ?Financial Resource Strain: Not on file  ?Food Insecurity: Not on file  ?Transportation Needs: Not on file  ?Physical Activity: Not on file  ?Stress: Not on file  ?Social Connections: Not on file  ?Intimate Partner Violence: Not on file  ? ? ?Review of Systems: ? ?All  other review of systems negative except as mentioned in the HPI. ? ?Physical Exam: ?Vital signs ?BP 134/78   Pulse 78   Temp 99.3 ?F (37.4 ?C) (Temporal)   Ht '5\' 10"'$  (1.778 m)   Wt 257 lb (116.6 kg)   SpO2

## 2021-06-28 NOTE — Progress Notes (Signed)
VS completed by AS.  Pt's states no medical or surgical changes since previsit or office visit.  

## 2021-06-28 NOTE — Op Note (Signed)
[place] ?Patient Name: Clayton [name] ?Procedure Date: 06/28/2021 3:02 PM ?MRN: [id number] ?Endoscopist: Gatha Mayer , MD ?Age: 61 ?Referring MD:  ?Date of Birth: 18-Mar-1961 ?Gender: Male ?Account #: [id number] ?Procedure:                Upper GI endoscopy ?Indications:              Esophageal reflux symptoms that persist despite  ?                          appropriate therapy, Chronic cough, Globus sensation ?Medicines:                Monitored Anesthesia Care ?Procedure:                Pre-Anesthesia Assessment: ?                          - Prior to the procedure, a History and Physical  ?                          was performed, and patient medications and  ?                          allergies were reviewed. The patient's tolerance of  ?                          previous anesthesia was also reviewed. The risks  ?                          and benefits of the procedure and the sedation  ?                          options and risks were discussed with the patient.  ?                          All questions were answered, and informed consent  ?                          was obtained. Prior Anticoagulants: The patient has  ?                          taken no previous anticoagulant or antiplatelet  ?                          agents. ASA Grade Assessment: III - A patient with  ?                          severe systemic disease. After reviewing the risks  ?                          and benefits, the patient was deemed in  ?                          satisfactory condition to undergo the procedure. ?  After obtaining informed consent, the endoscope was  ?                          passed under direct vision. Throughout the  ?                          procedure, the patient's blood pressure, pulse, and  ?                          oxygen saturations were monitored continuously. The  ?                          Endoscope was introduced through the mouth, and  ?                           advanced to the second part of duodenum. The upper  ?                          GI endoscopy was accomplished without difficulty.  ?                          The patient tolerated the procedure well. ?Scope In: ?Scope Out: ?Findings:                 The examined esophagus was normal. ?                          Striped mildly erythematous mucosa without bleeding  ?                          was found in the gastric antrum. Biopsies were  ?                          taken with a cold forceps for histology.  ?                          Verification of patient identification for the  ?                          specimen was done. Estimated blood loss was minimal. ?                          The examined duodenum was normal. ?                          The cardia and gastric fundus were normal on  ?                          retroflexion. ?                          Larynx/post pharynx w/o inflammation but ? edematous ?Complications:            No immediate complications. ?Estimated Blood Loss:     Estimated blood loss was minimal. ?Impression:               -  Normal esophagus. ?                          - Erythematous mucosa in the antrum. Biopsied. ?                          - Normal examined duodenum. ?Recommendation:           - Patient has a contact number available for  ?                          emergencies. The signs and symptoms of potential  ?                          delayed complications were discussed with the  ?                          patient. Return to normal activities tomorrow.  ?                          Written discharge instructions were provided to the  ?                          patient. ?                          - Resume previous diet. ?                          - Continue present medications. ?                          - Await pathology results. ?                          - Office to refer to ENT re: globus sensation,  ?                          cough, ? edema in post pharynx and larynx ? ENT  ?                           problems causing this ?Gatha Mayer, MD ?06/28/2021 3:46:51 PM ?This report has been signed electronically. ?

## 2021-06-28 NOTE — Patient Instructions (Addendum)
The stomach lining looked mildly inflamed - I took biopsies. ?That would not have anything to do with your throat symptoms. I will refer you to ENT for that. ? ?No colon polyps today! ? ?I appreciate the opportunity to care for you. ?Gatha Mayer, MD, [facility] ? ? ?Handout given on Diverticulosis. ? ? ? ?YOU HAD AN ENDOSCOPIC PROCEDURE TODAY AT [facility]:   Refer to the procedure report that was given to you for any specific questions about what was found during the examination.  If the procedure report does not answer your questions, please call your gastroenterologist to clarify.  If you requested that your care partner not be given the details of your procedure findings, then the procedure report has been included in a sealed envelope for you to review at your convenience later. ? ?YOU SHOULD EXPECT: Some feelings of bloating in the abdomen. Passage of more gas than usual.  Walking can help get rid of the air that was put into your GI tract during the procedure and reduce the bloating. If you had a lower endoscopy (such as a colonoscopy or flexible sigmoidoscopy) you may notice spotting of blood in your stool or on the toilet paper. If you underwent a bowel prep for your procedure, you may not have a normal bowel movement for a few days. ? ?Please Note:  You might notice some irritation and congestion in your nose or some drainage.  This is from the oxygen used during your procedure.  There is no need for concern and it should clear up in a day or so. ? ?SYMPTOMS TO REPORT IMMEDIATELY: ? ?Following lower endoscopy (colonoscopy or flexible sigmoidoscopy): ? Excessive amounts of blood in the stool ? Significant tenderness or worsening of abdominal pains ? Swelling of the abdomen that is new, acute ? Fever of 100?F or higher ? ?Following upper endoscopy (EGD) ? Vomiting of blood or coffee ground material ? New chest pain or pain under the shoulder blades ? Painful or persistently difficult  swallowing ? New shortness of breath ? Fever of 100?F or higher ? Black, tarry-looking stools ? ?For urgent or emergent issues, a gastroenterologist can be reached at any hour by calling [phone]. ?Do not use MyChart messaging for urgent concerns.  ? ? ?DIET:  We do recommend a small meal at first, but then you may proceed to your regular diet.  Drink plenty of fluids but you should avoid alcoholic beverages for 24 hours. ? ?ACTIVITY:  You should plan to take it easy for the rest of today and you should NOT DRIVE or use heavy machinery until tomorrow (because of the sedation medicines used during the test).   ? ?FOLLOW UP: ?Our staff will call the number listed on your records 48-72 hours following your procedure to check on you and address any questions or concerns that you may have regarding the information given to you following your procedure. If we do not reach you, we will leave a message.  We will attempt to reach you two times.  During this call, we will ask if you have developed any symptoms of COVID 19. If you develop any symptoms (ie: fever, flu-like symptoms, shortness of breath, cough etc.) before then, please call [phone].  If you test positive for Covid 19 in the 2 weeks post procedure, please call and report this information to [place].   ? ?If any biopsies were taken you will be contacted by phone or by letter within the  next 1-3 weeks.  Please call us at [phone] if you have not heard about the biopsies in 3 weeks.  ? ? ?SIGNATURES/CONFIDENTIALITY: ?You and/or your care partner have signed paperwork which will be entered into your electronic medical record.  These signatures attest to the fact that that the information above on your After Visit Summary has been reviewed and is understood.  Full responsibility of the confidentiality of this discharge information lies with you and/or your care-partner.  ?

## 2021-06-28 NOTE — Op Note (Signed)
[facility] ?Patient Name: Clayton [name] ?Procedure Date: 06/28/2021 2:51 PM ?MRN: [id number] ?Endoscopist: Gatha Mayer , MD ?Age: 61 ?Referring MD:  ?Date of Birth: Sep 06, 1960 ?Gender: Male ?Account #: [id number] ?Procedure:                Colonoscopy ?Indications:              Surveillance: Personal history of adenomatous  ?                          polyps on last colonoscopy > 5 years ago, Last  ?                          colonoscopy: 2016 ?Medicines:                Monitored Anesthesia Care ?Procedure:                Pre-Anesthesia Assessment: ?                          - Prior to the procedure, a History and Physical  ?                          was performed, and patient medications and  ?                          allergies were reviewed. The patient's tolerance of  ?                          previous anesthesia was also reviewed. The risks  ?                          and benefits of the procedure and the sedation  ?                          options and risks were discussed with the patient.  ?                          All questions were answered, and informed consent  ?                          was obtained. Prior Anticoagulants: The patient has  ?                          taken no previous anticoagulant or antiplatelet  ?                          agents. ASA Grade Assessment: III - A patient with  ?                          severe systemic disease. After reviewing the risks  ?                          and benefits, the patient was deemed in  ?  satisfactory condition to undergo the procedure. ?                          After obtaining informed consent, the colonoscope  ?                          was passed under direct vision. Throughout the  ?                          procedure, the patient's blood pressure, pulse, and  ?                          oxygen saturations were monitored continuously. The  ?                          CF [id number] #[id number] was introduced through the  anus  ?                          and advanced to the the cecum, identified by  ?                          appendiceal orifice and ileocecal valve. The  ?                          colonoscopy was performed without difficulty. The  ?                          patient tolerated the procedure well. The quality  ?                          of the bowel preparation was good. The ileocecal  ?                          valve, appendiceal orifice, and rectum were  ?                          photographed. The bowel preparation used was  ?                          Miralax via split dose instruction. ?Scope In: 3:16:48 PM ?Scope Out: 3:34:48 PM ?Scope Withdrawal Time: 0 hours 12 minutes 59 seconds  ?Total Procedure Duration: 0 hours 18 minutes 0 seconds  ?Findings:                 The perianal and digital rectal examinations were  ?                          normal. ?                          Multiple small-mouthed diverticula were found in  ?                          the sigmoid colon and descending colon. ?  There was a medium-sized lipoma, in the ascending  ?                          colon. ?                          The exam was otherwise without abnormality on  ?                          direct and retroflexion views. ?Complications:            No immediate complications. ?Estimated Blood Loss:     Estimated blood loss: none. ?Impression:               - Diverticulosis in the sigmoid colon and in the  ?                          descending colon. ?                          - Medium-sized lipoma in the ascending colon. ?                          - The examination was otherwise normal on direct  ?                          and retroflexion views. ?                          - No specimens collected. ?                          - Personal history of colonic polyps 2 diminutive  ?                          adenomas 2016. ?Recommendation:           - Patient has a contact number available for  ?                           emergencies. The signs and symptoms of potential  ?                          delayed complications were discussed with the  ?                          patient. Return to normal activities tomorrow.  ?                          Written discharge instructions were provided to the  ?                          patient. ?                          - Resume previous diet. ?                          -  Continue present medications. ?                          - Repeat colonoscopy in 10 years. ?Gatha Mayer, MD ?06/28/2021 3:50:41 PM ?This report has been signed electronically. ?

## 2021-06-28 NOTE — Progress Notes (Signed)
Called to room to assist during endoscopic procedure.  Patient ID and intended procedure confirmed with present staff. Received instructions for my participation in the procedure from the performing physician.  

## 2021-06-29 ENCOUNTER — Telehealth: Payer: Self-pay

## 2021-06-29 NOTE — Telephone Encounter (Signed)
Recommendation from Upper GI Endoscopy: Referral was sent to [facility] ENT along with pt records: Faxed to [phone] ?Left message for pt to call back:  ?

## 2021-06-30 ENCOUNTER — Telehealth: Payer: Self-pay

## 2021-06-30 NOTE — Telephone Encounter (Signed)
Left message for pt to call back  °

## 2021-06-30 NOTE — Telephone Encounter (Signed)
?  Follow up Call- ? ? ?  06/28/2021  ?  2:01 PM  ?Call back number  ?Post procedure Call Back phone  # [phone]  ?Permission to leave phone message Yes  ?  ? ?Patient questions: ? ?Do you have a fever, pain , or abdominal swelling? No. ?Pain Score  0 * ? ?Have you tolerated food without any problems? Yes.   ? ?Have you been able to return to your normal activities? Yes.   ? ?Do you have any questions about your discharge instructions: ?Diet   No. ?Medications  No. ?Follow up visit  No. ? ?Do you have questions or concerns about your Care? No. ? ?Actions: ?* If pain score is 4 or above: ?No action needed, pain <4. ? ? ?

## 2021-07-01 NOTE — Telephone Encounter (Signed)
Left message for pt to call back  °

## 2021-07-02 NOTE — Telephone Encounter (Signed)
Left message for pt to call back  °

## 2021-07-05 NOTE — Telephone Encounter (Signed)
Left message  to pt Hemphill,Anita (Spouse) to call back:  ?

## 2021-07-05 NOTE — Telephone Encounter (Signed)
Left message for pt to call back  °

## 2021-07-06 NOTE — Telephone Encounter (Signed)
Recommendation from Upper GI Endoscopy: Referral was sent to [facility] ENT along with pt records: Faxed to [phone] ?Pt made aware:  ?Pt verbalized understanding with all questions answered.  ? ?

## 2021-07-19 ENCOUNTER — Telehealth: Payer: Self-pay | Admitting: *Deleted

## 2021-07-19 MED ORDER — ONETOUCH VERIO VI STRP: ORAL_STRIP | 3 refills | Status: AC

## 2021-07-19 MED ORDER — ONETOUCH DELICA LANCETS 33G MISC: 3 refills | Status: AC

## 2021-07-19 NOTE — Telephone Encounter (Signed)
Aware refills sent to pharmacy 

## 2021-08-02 ENCOUNTER — Encounter: Payer: Self-pay | Admitting: Nurse Practitioner

## 2021-08-02 ENCOUNTER — Ambulatory Visit: Payer: BC Managed Care – PPO | Admitting: Nurse Practitioner

## 2021-08-02 VITALS — BP 131/78 | HR 68 | Temp 97.9°F | Resp 20 | Ht 70.0 in | Wt 247.0 lb

## 2021-08-02 DIAGNOSIS — F3342 Major depressive disorder, recurrent, in full remission: Secondary | ICD-10-CM

## 2021-08-02 DIAGNOSIS — F419 Anxiety disorder, unspecified: Secondary | ICD-10-CM

## 2021-08-02 DIAGNOSIS — E119 Type 2 diabetes mellitus without complications: Secondary | ICD-10-CM

## 2021-08-02 DIAGNOSIS — G4733 Obstructive sleep apnea (adult) (pediatric): Secondary | ICD-10-CM | POA: Diagnosis not present

## 2021-08-02 DIAGNOSIS — K219 Gastro-esophageal reflux disease without esophagitis: Secondary | ICD-10-CM

## 2021-08-02 DIAGNOSIS — I10 Essential (primary) hypertension: Secondary | ICD-10-CM

## 2021-08-02 DIAGNOSIS — E349 Endocrine disorder, unspecified: Secondary | ICD-10-CM

## 2021-08-02 DIAGNOSIS — Z720 Tobacco use: Secondary | ICD-10-CM

## 2021-08-02 DIAGNOSIS — E1169 Type 2 diabetes mellitus with other specified complication: Secondary | ICD-10-CM | POA: Diagnosis not present

## 2021-08-02 DIAGNOSIS — E785 Hyperlipidemia, unspecified: Secondary | ICD-10-CM

## 2021-08-02 LAB — BAYER DCA HB A1C WAIVED: HB A1C (BAYER DCA - WAIVED): 8.3 % — ABNORMAL HIGH (ref 4.8–5.6)

## 2021-08-02 IMAGING — CT CT HEART MORP W/ CTA COR W/ SCORE W/ CA W/CM &/OR W/O CM
4 of 7 series · 8 of 20 positions shown, 9 images · IV contrast (APPLIED)
Comparison: None.
COMPARISON: None.

Addendum:
EXAM:
OVER-READ INTERPRETATION  CT CHEST

The following report is an over-read performed by radiologist Dr.
Georgy Lisa [REDACTED] on 03/03/2020. This
over-read does not include interpretation of cardiac or coronary
anatomy or pathology. The coronary CTA interpretation by the
cardiologist is attached.
CLINICAL DATA: 58 year old male with chest pain
Cardiac/Coronary  CT
TECHNIQUE: The patient was scanned on a Phillips Force scanner.

[Series 6: best diast · axial · 0.39mm/px · z∈[+1356,+1395]mm · 2 of 296 slices shown, 3 images]
[im 99/296  vessel]
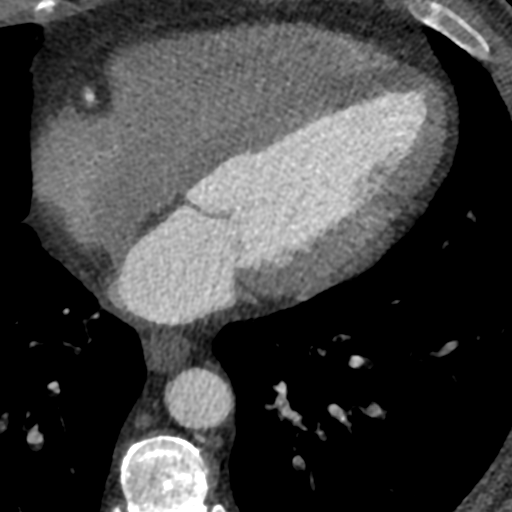
[im 99/296  lung]
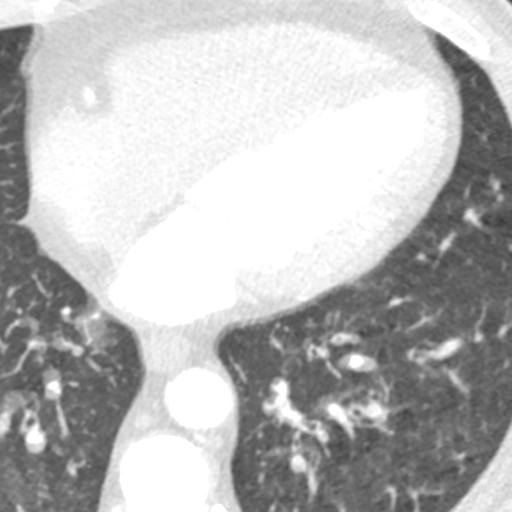
[im 197/296  vessel]
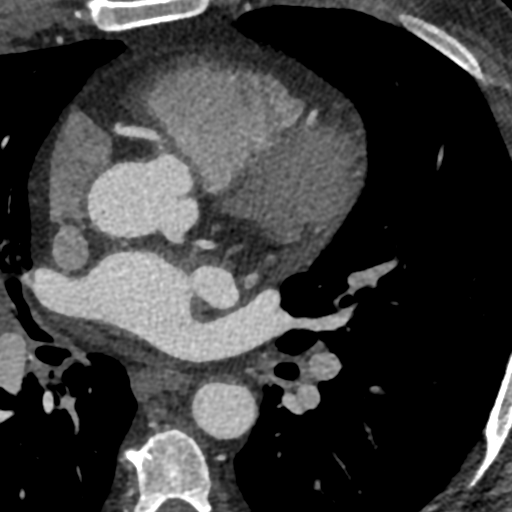

[Series 7: best syst · axial · 0.39mm/px · z∈[+1356,+1395]mm · 2 of 296 slices shown]
[im 99/296  vessel]
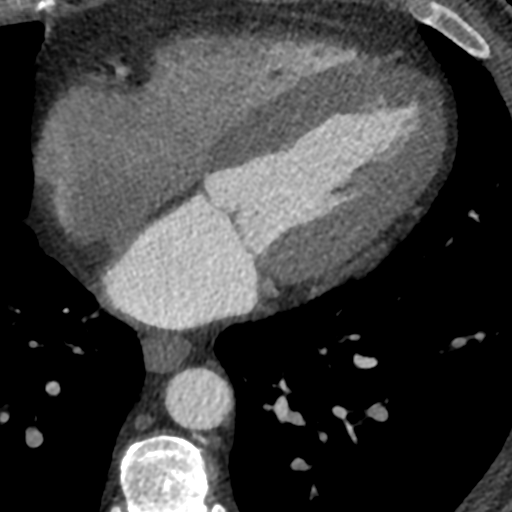
[im 197/296  vessel]
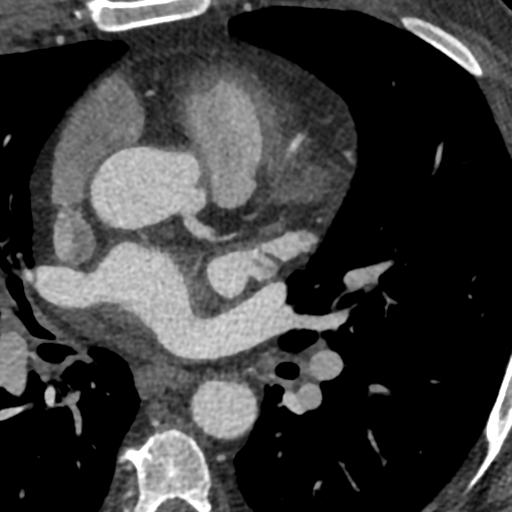

[Series 8: ts diast sharp · axial · 0.39mm/px · z∈[+1356,+1395]mm · 2 of 296 slices shown]
[im 99/296  lung]
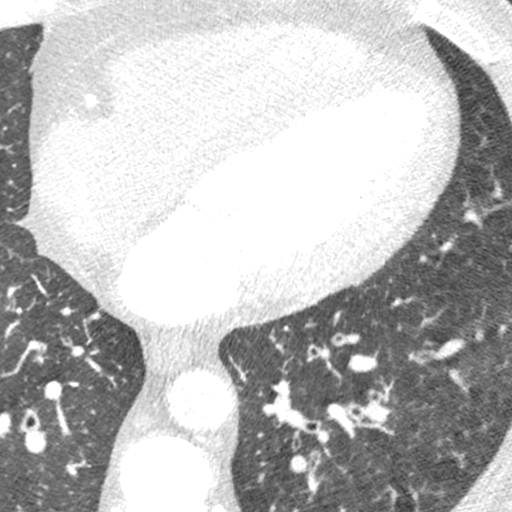
[im 197/296  lung]
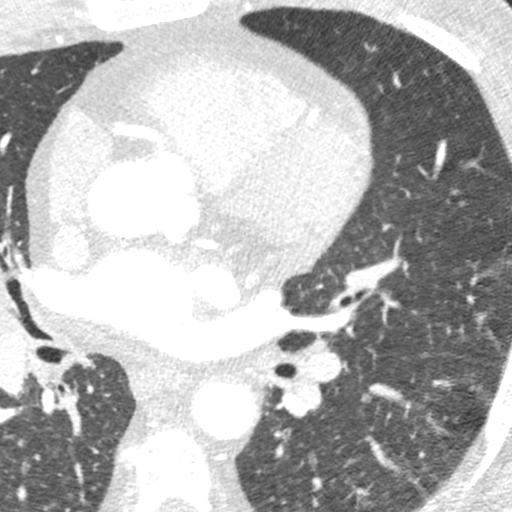

[Series 9: ts syst sharp · axial · 0.39mm/px · z∈[+1356,+1395]mm · 2 of 296 slices shown]
[im 99/296  lung]
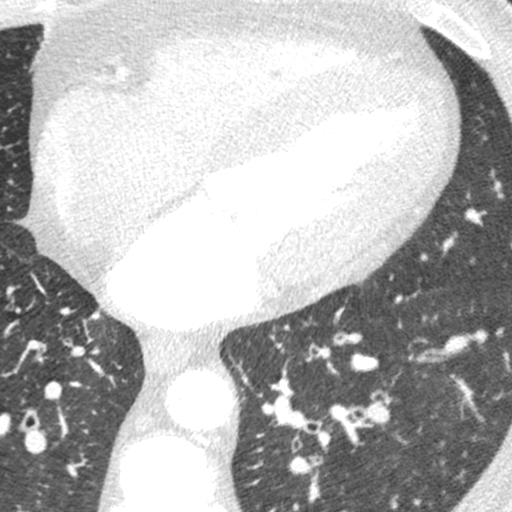
[im 197/296  lung]
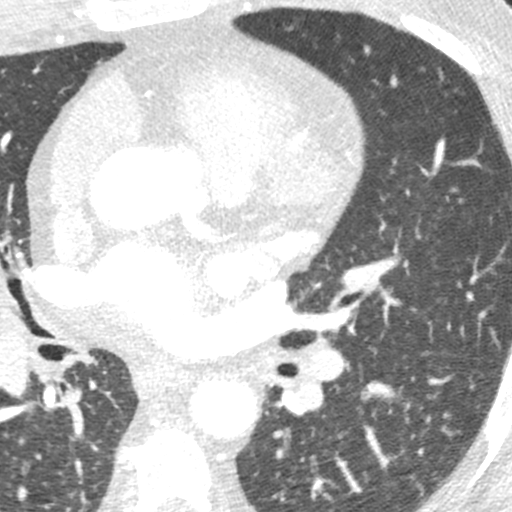

[8 of 20 positions shown; findings below may reference images not displayed]

FINDINGS: Vascular: No significant vascular findings. Normal heart size. No
pericardial effusion.

Mediastinum/Nodes: Visualized mediastinum and hilar regions
demonstrate no lymphadenopathy or masses.

Lungs/Pleura: Visualized lungs show no evidence of pulmonary edema,
consolidation, pneumothorax, nodule or pleural fluid.

Upper Abdomen: No acute abnormality.

Musculoskeletal: Some areas of sclerosis in visualized thoracic
vertebral bodies are felt to be related to degenerative disease.
IMPRESSION: No significant noncardiac findings. Some areas of sclerosis in
visualized thoracic vertebral bodies are felt to be related to
degenerative disease.
FINDINGS: A 120 kV prospective scan was triggered in the descending thoracic
aorta at 111 HU's. Axial non-contrast 3 mm slices were carried out
through the heart. The data set was analyzed on a dedicated work
station and scored using the Agatson method. Gantry rotation speed
was 250 msecs and collimation was .6 mm. No beta blockade and 0.8 mg
of sl NTG was given. The 3D data set was reconstructed in 5%
intervals of the 67-82 % of the R-R cycle. Diastolic phases were
analyzed on a dedicated work station using MPR, MIP and VRT modes.
The patient received 80 cc of contrast.

Aorta: Mild dilatation (38.6 mm). No calcifications. No dissection.

Aortic Valve:  Trileaflet.  No calcifications.

Coronary Arteries:  Normal coronary origin.  Right dominance.

RCA is a large dominant artery that gives rise to PDA and PLVB.
There is minimal (<24%) calcified lesion in RCA.

Left main is a large artery that gives rise to LAD and LCX arteries.

LAD is a large vessel that has no plaque.

LCX is a non-dominant artery that gives rise to one large OM1
branch. There is minimal calcified plaque in the mid LCX.

Other findings:

Normal pulmonary vein drainage into the left atrium.

Normal left atrial appendage without a thrombus.

Normal size of the pulmonary artery.
IMPRESSION: 1. Coronary calcium score of 3. This was 40 percentile for age and
sex matched control.

2. Normal coronary origin with right dominance.

3. Minimal CAD. [id number]. Aggressive medical therapy for primary
prevention.

Trista Tatis, DO

*** End of Addendum ***
EXAM:
OVER-READ INTERPRETATION  CT CHEST

The following report is an over-read performed by radiologist Dr.
Georgy Lisa [REDACTED] on 03/03/2020. This
over-read does not include interpretation of cardiac or coronary
anatomy or pathology. The coronary CTA interpretation by the
cardiologist is attached.
FINDINGS: Vascular: No significant vascular findings. Normal heart size. No
pericardial effusion.

Mediastinum/Nodes: Visualized mediastinum and hilar regions
demonstrate no lymphadenopathy or masses.

Lungs/Pleura: Visualized lungs show no evidence of pulmonary edema,
consolidation, pneumothorax, nodule or pleural fluid.

Upper Abdomen: No acute abnormality.

Musculoskeletal: Some areas of sclerosis in visualized thoracic
vertebral bodies are felt to be related to degenerative disease.
IMPRESSION: No significant noncardiac findings. Some areas of sclerosis in
visualized thoracic vertebral bodies are felt to be related to
degenerative disease.

## 2021-08-02 NOTE — Progress Notes (Signed)
? ?Subjective:  ? ? Patient ID: Clayton [name], male    DOB: 1960-12-28, 61 y.o.   MRN: [id number] ? ? ?Chief Complaint: Medical Management of Chronic Issues ?  ? ?HPI: ? ?Clayton [name] is a 61 y.o. who identifies as a male who was assigned male at birth.  ? ?Social history: ?Lives with: wife ?Work history: owns [employer] ? ? ?Comes in today for follow up of the following chronic medical issues: ? ?1. Essential hypertension, benign ?No c/o chest pain, sob or headache. Does not check blood pressure at home. ?BP Readings from Last 3 Encounters:  ?08/02/21 131/78  ?06/28/21 134/69  ?05/18/21 124/80  ? ? ? ?2. Hyperlipidemia associated with type 2 diabetes mellitus ([place]) ?Does not watch diet and does no dedicated  exercise. ?Lab Results  ?Component Value Date  ? CHOL 113 05/03/2021  ? HDL 25 (L) 05/03/2021  ? [place] 55 05/03/2021  ? LDLDIRECT 70 09/24/2014  ? TRIG 200 (H) 05/03/2021  ? CHOLHDL 4.5 05/03/2021  ? ? ? ?3. Type 2 diabetes mellitus without complication, without long-term current use of insulin ([place]) ?Fasting blood sugars are averaging in the 130's. He has had no low blood sugars. He refused medication change sat last visit. Wanted to try and get HGAB1c down on his own. ?Lab Results  ?Component Value Date  ? HGBA1C 8.3 (H) 05/03/2021  ? ? ? ?4. OSA (obstructive sleep apnea) ?Wears Cpap nightly. Feels rested in mornings ? ?5. Gastroesophageal reflux disease without esophagitis ?Is on protonix daily and is doing well. ? ?6. Testosterone deficiency ?Lab Results  ?Component Value Date  ? TESTOSTERONE 351 01/11/2021  ? ? ? ?7. Recurrent major depressive disorder, in full remission ([place]) ?Is on lexapro daily and is doing well. ? ?  08/02/2021  ?  8:42 AM 05/03/2021  ?  9:08 AM 01/11/2021  ?  9:55 AM  ?Depression screen PHQ 2/9  ?Decreased Interest 0 0 0  ?Down, Depressed, Hopeless 0 0 0  ?PHQ - 2 Score 0 0 0  ?Altered sleeping 1 0 0  ?Tired, decreased energy 0 0 1  ?Change in appetite 0 0 0  ?Feeling bad  or failure about yourself  0 0 0  ?Trouble concentrating 0 0 0  ?Moving slowly or fidgety/restless 0 0 0  ?Suicidal thoughts 0 0 0  ?PHQ-9 Score 1 0 1  ?Difficult doing work/chores Somewhat difficult Not difficult at all Not difficult at all  ? ? ? ?8. Anxiety ?Is on lexaparo and is doing well. ? ?  08/02/2021  ?  8:42 AM 05/03/2021  ?  9:08 AM 01/11/2021  ?  9:55 AM 09/14/2020  ? 11:07 AM  ?GAD 7 : Generalized Anxiety Score  ?Nervous, Anxious, on Edge 0 0 0 0  ?Control/stop worrying 0 0 0 0  ?Worry too much - different things 0 0 0 0  ?Trouble relaxing 1 0 0 1  ?Restless 1 0 0 1  ?Easily annoyed or irritable 0 0 0 0  ?Afraid - awful might happen 0 0 0 0  ?Total GAD 7 Score 2 0 0 2  ?Anxiety Difficulty Not difficult at all Not difficult at all Not difficult at all Not difficult at all  ? ? ? ? ?9. Tobacco use ?Smokes over a pack a day. ? ? ?10. Morbid obesity ([place]) ?Weight is down 10lbs ?Wt Readings from Last 3 Encounters:  ?08/02/21 247 lb (112 kg)  ?06/28/21 257 lb (116.6 kg)  ?05/18/21  257 lb (116.6 kg)  ? ?BMI Readings from Last 3 Encounters:  ?08/02/21 35.44 kg/m?  ?06/28/21 36.88 kg/m?  ?05/18/21 36.88 kg/m?  ? ? ? ? ?New complaints: ?None today ? ?No Known Allergies ?Outpatient Encounter Medications as of 08/02/2021  ?Medication Sig  ? aspirin 81 MG EC tablet Take 1 tablet (81 mg total) by mouth daily. Swallow whole.  ? escitalopram (LEXAPRO) 10 MG tablet TAKE 1 TABLET BY MOUTH EVERY DAY  ? glimepiride (AMARYL) 2 MG tablet Take 1 tablet (2 mg total) by mouth daily before breakfast.  ? glucose blood (ONETOUCH VERIO) test strip Use to check BG 1 to 2 times daily Dx: E11.9  ? hydrochlorothiazide (HYDRODIURIL) 25 MG tablet Take 1 tablet (25 mg total) by mouth daily.  ? losartan (COZAAR) 100 MG tablet TAKE 1 TABLET BY MOUTH EVERY DAY  ? metFORMIN (GLUCOPHAGE) 1000 MG tablet TAKE 1 TABLET BY MOUTH TWICE A DAY WITH FOOD  ? OneTouch Delica Lancets 78E MISC Use to check BG 1 to 2 times daily.  Dx:  E11.9  ? pantoprazole  (PROTONIX) 40 MG tablet Take 1 tablet (40 mg total) by mouth daily.  ? rosuvastatin (CRESTOR) 10 MG tablet Take 1 tablet (10 mg total) by mouth daily.  ? fenofibrate (TRICOR) 145 MG tablet Take 1 tablet (145 mg total) by mouth daily.  ? nitroGLYCERIN (NITROSTAT) 0.4 MG SL tablet Place 1 tablet (0.4 mg total) under the tongue every 5 (five) minutes as needed for chest pain.  ? ?No facility-administered encounter medications on file as of 08/02/2021.  ? ? ?Past Surgical History:  ?Procedure Laterality Date  ? ANTERIOR CRUCIATE LIGAMENT REPAIR Left 2012  ? Knee  ? COLONOSCOPY    ? POLYPECTOMY    ? ? ?Family History  ?Problem Relation Age of Onset  ? Hypertension Father   ? Diabetes Father   ? Heart attack Mother   ? Breast cancer Mother   ? Diabetes Paternal Grandfather   ? Colon cancer Neg Hx   ? Esophageal cancer Neg Hx   ? Rectal cancer Neg Hx   ? Stomach cancer Neg Hx   ? Prostate cancer Neg Hx   ? Pancreatic cancer Neg Hx   ? ? ? ? ?Controlled substance contract: n/a ? ? ? ? ?Review of Systems  ?Constitutional:  Negative for diaphoresis.  ?Eyes:  Negative for pain.  ?Respiratory:  Negative for shortness of breath.   ?Cardiovascular:  Negative for chest pain, palpitations and leg swelling.  ?Gastrointestinal:  Negative for abdominal pain.  ?Endocrine: Negative for polydipsia.  ?Skin:  Negative for rash.  ?Neurological:  Negative for dizziness, weakness and headaches.  ?Hematological:  Does not bruise/bleed easily.  ?All other systems reviewed and are negative. ? ?   ?Objective:  ? Physical Exam ?Vitals and nursing note reviewed.  ?Constitutional:   ?   Appearance: Normal appearance. He is well-developed.  ?HENT:  ?   Head: Normocephalic.  ?   Nose: Nose normal.  ?   Mouth/Throat:  ?   Mouth: Mucous membranes are moist.  ?   Pharynx: Oropharynx is clear.  ?Eyes:  ?   Pupils: Pupils are equal, round, and reactive to light.  ?Neck:  ?   Thyroid: No thyroid mass or thyromegaly.  ?   Vascular: No carotid bruit or JVD.  ?    Trachea: Phonation normal.  ?Cardiovascular:  ?   Rate and Rhythm: Normal rate and regular rhythm.  ?Pulmonary:  ?   Effort:  Pulmonary effort is normal. No respiratory distress.  ?   Breath sounds: Normal breath sounds.  ?Abdominal:  ?   General: Bowel sounds are normal.  ?   Palpations: Abdomen is soft.  ?   Tenderness: There is no abdominal tenderness.  ?Musculoskeletal:     ?   General: Normal range of motion.  ?   Cervical back: Normal range of motion and neck supple.  ?Lymphadenopathy:  ?   Cervical: No cervical adenopathy.  ?Skin: ?   General: Skin is warm and dry.  ?Neurological:  ?   Mental Status: He is alert and oriented to person, place, and time.  ?Psychiatric:     ?   Behavior: Behavior normal.     ?   Thought Content: Thought content normal.     ?   Judgment: Judgment normal.  ? ? ?BP 131/78   Pulse 68   Temp 97.9 ?F (36.6 ?C) (Temporal)   Resp 20   Ht $R'5\' 10"'dU$  (1.778 m)   Wt 247 lb (112 kg)   SpO2 97%   BMI 35.44 kg/m?  ? ?Hgba1c 8.3% ? ?   ?Assessment & Plan:  ? ?.Clayton [name] comes in today with chief complaint of Medical Management of Chronic Issues ? ? ?Diagnosis and orders addressed: ? ?1. Essential hypertension, benign ?Low sodium diet ?- CBC with Differential/Platelet ?- CMP14+EGFR ? ?2. Hyperlipidemia associated with type 2 diabetes mellitus ([place]) ?Low fat diet ?- Lipid panel ? ?3. Type 2 diabetes mellitus without complication, without long-term current use of insulin ([place]) ?Stricter carb counting ?Refuses medication changes ?Wants to try diet control again ?- Bayer DCA Hb A1c Waived ? ?4. OSA (obstructive sleep apnea) ?Continue to wear CPAP ? ?5. Gastroesophageal reflux disease without esophagitis ?Avoid spicy foods ?Do not eat 2 hours prior to bedtime ? ?6. Testosterone deficiency ? ?7. Recurrent major depressive disorder, in full remission ([place]) ?Stress management ? ?8. Anxiety ? ?9. Tobacco use ?Smoking cessation encouraged ?- CT CHEST LUNG CA SCREEN LOW DOSE W/O CM;  Future ? ?10. Morbid obesity ([place]) ?Discussed diet and exercise for person with BMI >25 ?Will recheck weight in 3-6 months ? ? ? ?Labs pending ?Health Maintenance reviewed ?Diet and exercise encouraged ? ?Follow up pl

## 2021-08-02 NOTE — Patient Instructions (Signed)

## 2021-08-03 ENCOUNTER — Ambulatory Visit: Payer: BC Managed Care – PPO | Admitting: Nurse Practitioner

## 2021-08-03 LAB — CMP14+EGFR
ALT: 14 IU/L (ref 0–44)
AST: 8 IU/L (ref 0–40)
Albumin/Globulin Ratio: 1.6 (ref 1.2–2.2)
Albumin: 4.3 g/dL (ref 3.8–4.9)
Alkaline Phosphatase: 82 IU/L (ref 44–121)
BUN/Creatinine Ratio: 22 — ABNORMAL HIGH (ref 9–20)
BUN: 16 mg/dL (ref 6–24)
Bilirubin Total: 0.6 mg/dL (ref 0.0–1.2)
CO2: 26 mmol/L (ref 20–29)
Calcium: 9.2 mg/dL (ref 8.7–10.2)
Chloride: 100 mmol/L (ref 96–106)
Creatinine, Ser: 0.73 mg/dL — ABNORMAL LOW (ref 0.76–1.27)
Globulin, Total: 2.7 g/dL (ref 1.5–4.5)
Glucose: 154 mg/dL — ABNORMAL HIGH (ref 70–99)
Potassium: 4.3 mmol/L (ref 3.5–5.2)
Sodium: 138 mmol/L (ref 134–144)
Total Protein: 7 g/dL (ref 6.0–8.5)
eGFR: 105 mL/min/{1.73_m2} (ref >59)

## 2021-08-03 LAB — CBC WITH DIFFERENTIAL/PLATELET
Basophils Absolute: 0.1 10*3/uL (ref 0.0–0.2)
Basos: 1 %
EOS (ABSOLUTE): 0.4 10*3/uL (ref 0.0–0.4)
Eos: 4 %
Hematocrit: 42 % (ref 37.5–51.0)
Hemoglobin: 13.9 g/dL (ref 13.0–17.7)
Immature Grans (Abs): 0.1 10*3/uL (ref 0.0–0.1)
Immature Granulocytes: 1 %
Lymphocytes Absolute: 2.5 10*3/uL (ref 0.7–3.1)
Lymphs: 24 %
MCH: 30.2 pg (ref 26.6–33.0)
MCHC: 33.1 g/dL (ref 31.5–35.7)
MCV: 91 fL (ref 79–97)
Monocytes Absolute: 0.8 10*3/uL (ref 0.1–0.9)
Monocytes: 7 %
Neutrophils Absolute: 6.6 10*3/uL (ref 1.4–7.0)
Neutrophils: 63 %
Platelets: 236 10*3/uL (ref 150–450)
RBC: 4.61 x10E6/uL (ref 4.14–5.80)
RDW: 12.5 % (ref 11.6–15.4)
WBC: 10.5 10*3/uL (ref 3.4–10.8)

## 2021-08-03 LAB — LIPID PANEL
Chol/HDL Ratio: 4.3 ratio (ref 0.0–5.0)
Cholesterol, Total: 119 mg/dL (ref 100–199)
HDL: 28 mg/dL — ABNORMAL LOW (ref >39)
LDL Chol Calc (NIH): 62 mg/dL (ref 0–99)
Triglycerides: 167 mg/dL — ABNORMAL HIGH (ref 0–149)
VLDL Cholesterol Cal: 29 mg/dL (ref 5–40)

## 2021-08-04 ENCOUNTER — Encounter (HOSPITAL_COMMUNITY): Payer: Self-pay

## 2021-08-04 NOTE — Progress Notes (Signed)
[facility] referral received. Attempted to reach patient but was unable to do so. VM box full, unable to leave VM. ? ?

## 2021-08-26 ENCOUNTER — Telehealth: Payer: Self-pay | Admitting: *Deleted

## 2021-08-26 DIAGNOSIS — E119 Type 2 diabetes mellitus without complications: Secondary | ICD-10-CM

## 2021-08-26 MED ORDER — METFORMIN HCL 1000 MG PO TABS: ORAL_TABLET | ORAL | 0 refills | Status: DC

## 2021-08-26 NOTE — Telephone Encounter (Signed)
Pt awre refill sent

## 2021-11-02 ENCOUNTER — Ambulatory Visit: Payer: BC Managed Care – PPO | Admitting: Nurse Practitioner

## 2021-11-08 ENCOUNTER — Ambulatory Visit: Payer: BC Managed Care – PPO | Admitting: Nurse Practitioner

## 2021-11-08 ENCOUNTER — Encounter: Payer: Self-pay | Admitting: Nurse Practitioner

## 2021-11-08 VITALS — BP 118/66 | HR 71 | Temp 97.5°F | Ht 70.0 in | Wt 247.6 lb

## 2021-11-08 DIAGNOSIS — E1169 Type 2 diabetes mellitus with other specified complication: Secondary | ICD-10-CM

## 2021-11-08 DIAGNOSIS — E119 Type 2 diabetes mellitus without complications: Secondary | ICD-10-CM

## 2021-11-08 DIAGNOSIS — E349 Endocrine disorder, unspecified: Secondary | ICD-10-CM

## 2021-11-08 DIAGNOSIS — I10 Essential (primary) hypertension: Secondary | ICD-10-CM

## 2021-11-08 DIAGNOSIS — K219 Gastro-esophageal reflux disease without esophagitis: Secondary | ICD-10-CM

## 2021-11-08 DIAGNOSIS — F3342 Major depressive disorder, recurrent, in full remission: Secondary | ICD-10-CM

## 2021-11-08 DIAGNOSIS — E785 Hyperlipidemia, unspecified: Secondary | ICD-10-CM

## 2021-11-08 DIAGNOSIS — G4733 Obstructive sleep apnea (adult) (pediatric): Secondary | ICD-10-CM

## 2021-11-08 DIAGNOSIS — F419 Anxiety disorder, unspecified: Secondary | ICD-10-CM

## 2021-11-08 DIAGNOSIS — Z720 Tobacco use: Secondary | ICD-10-CM

## 2021-11-08 LAB — BAYER DCA HB A1C WAIVED: HB A1C (BAYER DCA - WAIVED): 7.3 % — ABNORMAL HIGH (ref 4.8–5.6)

## 2021-11-08 MED ORDER — LOSARTAN POTASSIUM 100 MG PO TABS: ORAL_TABLET | ORAL | 1 refills | Status: DC

## 2021-11-08 MED ORDER — HYDROCHLOROTHIAZIDE 25 MG PO TABS: 25.0000 mg | ORAL_TABLET | Freq: Every day | ORAL | 1 refills | Status: DC

## 2021-11-08 MED ORDER — FENOFIBRATE 145 MG PO TABS: 145.0000 mg | ORAL_TABLET | Freq: Every day | ORAL | 1 refills | Status: DC

## 2021-11-08 MED ORDER — METFORMIN HCL 1000 MG PO TABS: ORAL_TABLET | ORAL | 1 refills | Status: DC

## 2021-11-08 MED ORDER — GLIMEPIRIDE 2 MG PO TABS: 2.0000 mg | ORAL_TABLET | Freq: Every day | ORAL | 1 refills | Status: DC

## 2021-11-08 MED ORDER — ESCITALOPRAM OXALATE 10 MG PO TABS: ORAL_TABLET | ORAL | 1 refills | Status: DC

## 2021-11-08 MED ORDER — ROSUVASTATIN CALCIUM 10 MG PO TABS: 10.0000 mg | ORAL_TABLET | Freq: Every day | ORAL | 1 refills | Status: DC

## 2021-11-08 MED ORDER — PANTOPRAZOLE SODIUM 40 MG PO TBEC: 40.0000 mg | DELAYED_RELEASE_TABLET | Freq: Every day | ORAL | 1 refills | Status: DC

## 2021-11-08 NOTE — Patient Instructions (Signed)

## 2021-11-08 NOTE — Progress Notes (Signed)
Subjective:    Patient ID: Clayton [name], male    DOB: [birth year], 61 y.o.   MRN: [id number]   Chief Complaint: Medical Management of Chronic Issues    HPI:  Clayton [name] is a 61 y.o. who identifies as a male who was assigned male at birth.   Social history: Lives with: wife Work history: owns [employer]   Comes in today for follow up of the following chronic medical issues:  1. Type 2 diabetes mellitus without complication, without long-term current use of insulin (HCC) Does not check blood sugar everyday. But when he does check them they average 120-141. No low blood sugars.  2. Essential hypertension, benign Np c/o chest pain, sob or headache. Does not check blood pressure at home. BP Readings from Last 3 Encounters:  08/02/21 131/78  06/28/21 134/69  05/18/21 124/80     3. Hyperlipidemia associated with type 2 diabetes mellitus (HCC) Does try to watch diet but does no dedicated exercise. Lab Results  Component Value Date   CHOL 119 08/02/2021   HDL 28 (L) 08/02/2021   LDLCALC 62 08/02/2021   LDLDIRECT 70 09/24/2014   TRIG 167 (H) 08/02/2021   CHOLHDL 4.3 08/02/2021   The ASCVD Risk score (Arnett DK, et al., 2019) failed to calculate for the following reasons:   The valid total cholesterol range is 130 to 320 mg/dL   4. Gastroesophageal reflux disease without esophagitis Is on protonix daily and is doing well.  5. Testosterone deficiency Is on no testosterone replacement Lab Results  Component Value Date   TESTOSTERONE 351 01/11/2021     6. Recurrent major depressive disorder, in full remission (HCC) Is on lexapro and is doig well.    11/08/2021   10:48 AM 08/02/2021    8:42 AM 05/03/2021    9:08 AM  Depression screen PHQ 2/9  Decreased Interest 0 0 0  Down, Depressed, Hopeless 0 0 0  PHQ - 2 Score 0 0 0  Altered sleeping 1 1 0  Tired, decreased energy 0 0 0  Change in appetite 0 0 0  Feeling bad or failure about yourself  0 0 0   Trouble concentrating 0 0 0  Moving slowly or fidgety/restless 0 0 0  Suicidal thoughts 0 0 0  PHQ-9 Score 1 1 0  Difficult doing work/chores Not difficult at all Somewhat difficult Not difficult at all     7. Anxiety Lexapro helps    11/08/2021   10:48 AM 08/02/2021    8:42 AM 05/03/2021    9:08 AM 01/11/2021    9:55 AM  GAD 7 : Generalized Anxiety Score  Nervous, Anxious, on Edge 0 0 0 0  Control/stop worrying 0 0 0 0  Worry too much - different things 0 0 0 0  Trouble relaxing 1 1 0 0  Restless 1 1 0 0  Easily annoyed or irritable 0 0 0 0  Afraid - awful might happen 0 0 0 0  Total GAD 7 Score 2 2 0 0  Anxiety Difficulty Not difficult at all Not difficult at all Not difficult at all Not difficult at all      8. OSA (obstructive sleep apnea) Wears cpap everytime he sleeps. Feels rested in mornings.  9. Tobacco use Smoke about 3/4 pack a day- he is trying to cut back  10. Morbid obesity (HCC) No recent weight changes Wt Readings from Last 3 Encounters:  11/08/21 247 lb 9.6 oz (112.3 kg)  08/02/21 247 lb (112 kg)  06/28/21 257 lb (116.6 kg)   BMI Readings from Last 3 Encounters:  11/08/21 35.53 kg/m  08/02/21 35.44 kg/m  06/28/21 36.88 kg/m      New complaints: None today  No Known Allergies Outpatient Encounter Medications as of 11/08/2021  Medication Sig   aspirin 81 MG EC tablet Take 1 tablet (81 mg total) by mouth daily. Swallow whole.   escitalopram (LEXAPRO) 10 MG tablet TAKE 1 TABLET BY MOUTH EVERY DAY   fenofibrate (TRICOR) 145 MG tablet Take 1 tablet (145 mg total) by mouth daily.   glimepiride (AMARYL) 2 MG tablet Take 1 tablet (2 mg total) by mouth daily before breakfast.   glucose blood (ONETOUCH VERIO) test strip Use to check BG 1 to 2 times daily Dx: E11.9   hydrochlorothiazide (HYDRODIURIL) 25 MG tablet Take 1 tablet (25 mg total) by mouth daily.   losartan (COZAAR) 100 MG tablet TAKE 1 TABLET BY MOUTH EVERY DAY   metFORMIN (GLUCOPHAGE)  1000 MG tablet TAKE 1 TABLET BY MOUTH TWICE A DAY WITH FOOD   nitroGLYCERIN (NITROSTAT) 0.4 MG SL tablet Place 1 tablet (0.4 mg total) under the tongue every 5 (five) minutes as needed for chest pain.   OneTouch Delica Lancets 81W MISC Use to check BG 1 to 2 times daily.  Dx:  E11.9   pantoprazole (PROTONIX) 40 MG tablet Take 1 tablet (40 mg total) by mouth daily.   rosuvastatin (CRESTOR) 10 MG tablet Take 1 tablet (10 mg total) by mouth daily.   No facility-administered encounter medications on file as of 11/08/2021.    Past Surgical History:  Procedure Laterality Date   ANTERIOR CRUCIATE LIGAMENT REPAIR Left 2012   Knee   COLONOSCOPY     POLYPECTOMY      Family History  Problem Relation Age of Onset   Hypertension Father    Diabetes Father    Heart attack Mother    Breast cancer Mother    Diabetes Paternal Grandfather    Colon cancer Neg Hx    Esophageal cancer Neg Hx    Rectal cancer Neg Hx    Stomach cancer Neg Hx    Prostate cancer Neg Hx    Pancreatic cancer Neg Hx       Controlled substance contract: n/a     Review of Systems  Constitutional:  Negative for diaphoresis.  Eyes:  Negative for pain.  Respiratory:  Negative for shortness of breath.   Cardiovascular:  Negative for chest pain, palpitations and leg swelling.  Gastrointestinal:  Negative for abdominal pain.  Endocrine: Negative for polydipsia.  Skin:  Negative for rash.  Neurological:  Negative for dizziness, weakness and headaches.  Hematological:  Does not bruise/bleed easily.  All other systems reviewed and are negative.      Objective:   Physical Exam Vitals and nursing note reviewed.  Constitutional:      Appearance: Normal appearance. He is well-developed.  HENT:     Head: Normocephalic.     Nose: Nose normal.     Mouth/Throat:     Mouth: Mucous membranes are moist.     Pharynx: Oropharynx is clear.  Eyes:     Pupils: Pupils are equal, round, and reactive to light.  Neck:      Thyroid: No thyroid mass or thyromegaly.     Vascular: No carotid bruit or JVD.     Trachea: Phonation normal.  Cardiovascular:     Rate and Rhythm: Normal rate and regular  rhythm.  Pulmonary:     Effort: Pulmonary effort is normal. No respiratory distress.     Breath sounds: Normal breath sounds.  Abdominal:     General: Bowel sounds are normal.     Palpations: Abdomen is soft.     Tenderness: There is no abdominal tenderness.  Musculoskeletal:        General: Normal range of motion.     Cervical back: Normal range of motion and neck supple.  Lymphadenopathy:     Cervical: No cervical adenopathy.  Skin:    General: Skin is warm and dry.  Neurological:     Mental Status: He is alert and oriented to person, place, and time.  Psychiatric:        Behavior: Behavior normal.        Thought Content: Thought content normal.        Judgment: Judgment normal.    BP 118/66   Pulse 71   Temp (!) 97.5 F (36.4 C) (Temporal)   Ht $R'5\' 10"'sl$  (1.778 m)   Wt 247 lb 9.6 oz (112.3 kg)   SpO2 95%   BMI 35.53 kg/m   HGBA1c 7.3%      Assessment & Plan:   Clayton [name] comes in today with chief complaint of Medical Management of Chronic Issues   Diagnosis and orders addressed:  1. Type 2 diabetes mellitus without complication, without long-term current use of insulin (HCC) Continue  to watch carbs in diet - Bayer DCA Hb A1c Waived - metFORMIN (GLUCOPHAGE) 1000 MG tablet; TAKE 1 TABLET BY MOUTH TWICE A DAY WITH FOOD  Dispense: 180 tablet; Refill: 1 - glimepiride (AMARYL) 2 MG tablet; Take 1 tablet (2 mg total) by mouth daily before breakfast.  Dispense: 90 tablet; Refill: 1  2. Essential hypertension, benign Low sodium diet - CMP14+EGFR - hydrochlorothiazide (HYDRODIURIL) 25 MG tablet; Take 1 tablet (25 mg total) by mouth daily.  Dispense: 90 tablet; Refill: 1 - losartan (COZAAR) 100 MG tablet; TAKE 1 TABLET BY MOUTH EVERY DAY  Dispense: 90 tablet; Refill: 1  3. Hyperlipidemia  associated with type 2 diabetes mellitus (HCC) Low fat diet - Lipid panel - fenofibrate (TRICOR) 145 MG tablet; Take 1 tablet (145 mg total) by mouth daily.  Dispense: 90 tablet; Refill: 1 - rosuvastatin (CRESTOR) 10 MG tablet; Take 1 tablet (10 mg total) by mouth daily.  Dispense: 90 tablet; Refill: 1  4. Gastroesophageal reflux disease without esophagitis Avoid spicy foods Do not eat 2 hours prior to bedtime  - pantoprazole (PROTONIX) 40 MG tablet; Take 1 tablet (40 mg total) by mouth daily.  Dispense: 90 tablet; Refill: 1  5. Testosterone deficiency Refuses testosterone   6. Recurrent major depressive disorder, in full remission ([place]) Stress management - escitalopram (LEXAPRO) 10 MG tablet; TAKE 1 TABLET BY MOUTH EVERY DAY  Dispense: 90 tablet; Refill: 1  7. Anxiety  8. OSA (obstructive sleep apnea) Continue to wear cpap  9. Tobacco use Continue with smoking cessation  10. Morbid obesity ([place]) Discussed diet and exercise for person with BMI >25 Will recheck weight in 3-6 months    Labs pending Health Maintenance reviewed Diet and exercise encouraged  Follow up plan: 3 months   Mary-Margaret Hassell Done, FNP

## 2021-11-09 LAB — CMP14+EGFR
ALT: 17 IU/L (ref 0–44)
AST: 13 IU/L (ref 0–40)
Albumin/Globulin Ratio: 1.7 (ref 1.2–2.2)
Albumin: 4.5 g/dL (ref 3.8–4.9)
Alkaline Phosphatase: 86 IU/L (ref 44–121)
BUN/Creatinine Ratio: 18 (ref 9–20)
BUN: 15 mg/dL (ref 6–24)
Bilirubin Total: 0.8 mg/dL (ref 0.0–1.2)
CO2: 26 mmol/L (ref 20–29)
Calcium: 9.8 mg/dL (ref 8.7–10.2)
Chloride: 98 mmol/L (ref 96–106)
Creatinine, Ser: 0.84 mg/dL (ref 0.76–1.27)
Globulin, Total: 2.6 g/dL (ref 1.5–4.5)
Glucose: 158 mg/dL — ABNORMAL HIGH (ref 70–99)
Potassium: 4.2 mmol/L (ref 3.5–5.2)
Sodium: 138 mmol/L (ref 134–144)
Total Protein: 7.1 g/dL (ref 6.0–8.5)
eGFR: 100 mL/min/{1.73_m2} (ref >59)

## 2021-11-09 LAB — LIPID PANEL
Chol/HDL Ratio: 4.6 ratio (ref 0.0–5.0)
Cholesterol, Total: 142 mg/dL (ref 100–199)
HDL: 31 mg/dL — ABNORMAL LOW (ref >39)
LDL Chol Calc (NIH): 80 mg/dL (ref 0–99)
Triglycerides: 182 mg/dL — ABNORMAL HIGH (ref 0–149)
VLDL Cholesterol Cal: 31 mg/dL (ref 5–40)

## 2022-01-31 ENCOUNTER — Ambulatory Visit: Payer: BC Managed Care – PPO | Admitting: Nurse Practitioner

## 2022-02-08 ENCOUNTER — Ambulatory Visit: Payer: BC Managed Care – PPO | Admitting: Nurse Practitioner

## 2022-02-14 LAB — HM DIABETES EYE EXAM

## 2022-02-21 ENCOUNTER — Encounter: Payer: Self-pay | Admitting: Nurse Practitioner

## 2022-02-21 ENCOUNTER — Ambulatory Visit: Payer: BC Managed Care – PPO | Admitting: Nurse Practitioner

## 2022-02-21 VITALS — BP 135/77 | HR 64 | Temp 98.1°F | Resp 20 | Ht 70.0 in | Wt 253.0 lb

## 2022-02-21 DIAGNOSIS — I10 Essential (primary) hypertension: Secondary | ICD-10-CM

## 2022-02-21 DIAGNOSIS — F3342 Major depressive disorder, recurrent, in full remission: Secondary | ICD-10-CM

## 2022-02-21 DIAGNOSIS — E1169 Type 2 diabetes mellitus with other specified complication: Secondary | ICD-10-CM

## 2022-02-21 DIAGNOSIS — G4733 Obstructive sleep apnea (adult) (pediatric): Secondary | ICD-10-CM

## 2022-02-21 DIAGNOSIS — E119 Type 2 diabetes mellitus without complications: Secondary | ICD-10-CM

## 2022-02-21 DIAGNOSIS — K219 Gastro-esophageal reflux disease without esophagitis: Secondary | ICD-10-CM | POA: Diagnosis not present

## 2022-02-21 DIAGNOSIS — E785 Hyperlipidemia, unspecified: Secondary | ICD-10-CM

## 2022-02-21 DIAGNOSIS — Z720 Tobacco use: Secondary | ICD-10-CM

## 2022-02-21 DIAGNOSIS — F419 Anxiety disorder, unspecified: Secondary | ICD-10-CM

## 2022-02-21 LAB — BAYER DCA HB A1C WAIVED: HB A1C (BAYER DCA - WAIVED): 7.6 % — ABNORMAL HIGH (ref 4.8–5.6)

## 2022-02-21 MED ORDER — LOSARTAN POTASSIUM 100 MG PO TABS: ORAL_TABLET | ORAL | 1 refills | Status: DC

## 2022-02-21 MED ORDER — FENOFIBRATE 145 MG PO TABS: 145.0000 mg | ORAL_TABLET | Freq: Every day | ORAL | 1 refills | Status: DC

## 2022-02-21 MED ORDER — HYDROCHLOROTHIAZIDE 25 MG PO TABS: 25.0000 mg | ORAL_TABLET | Freq: Every day | ORAL | 1 refills | Status: DC

## 2022-02-21 MED ORDER — METFORMIN HCL 1000 MG PO TABS: ORAL_TABLET | ORAL | 1 refills | Status: DC

## 2022-02-21 MED ORDER — GLIMEPIRIDE 2 MG PO TABS: 2.0000 mg | ORAL_TABLET | Freq: Every day | ORAL | 1 refills | Status: DC

## 2022-02-21 MED ORDER — ESCITALOPRAM OXALATE 10 MG PO TABS: ORAL_TABLET | ORAL | 1 refills | Status: DC

## 2022-02-21 MED ORDER — ROSUVASTATIN CALCIUM 10 MG PO TABS: 10.0000 mg | ORAL_TABLET | Freq: Every day | ORAL | 1 refills | Status: DC

## 2022-02-21 MED ORDER — PANTOPRAZOLE SODIUM 40 MG PO TBEC: 40.0000 mg | DELAYED_RELEASE_TABLET | Freq: Every day | ORAL | 1 refills | Status: DC

## 2022-02-21 NOTE — Patient Instructions (Signed)
How to Increase Your Level of Physical Activity Getting regular physical activity is important for your overall health and well-being. Most people do not get enough exercise. There are easy ways to increase your level of physical activity, even if you have not been very active in the past or if you are just starting out. What are the benefits of physical activity? Physical activity has many short-term and long-term benefits. Being active on a regular basis can improve your physical and mental health as well as provide other benefits. Physical health benefits Helping you lose weight or maintain a healthy weight. Strengthening your muscles and bones. Reducing your risk of certain long-term (chronic) diseases, including heart disease, cancer, and diabetes. Being able to move around more easily and for longer periods of time without getting tired (increased endurance or stamina). Improving your ability to fight off illness (enhanced immunity). Being able to sleep better. Helping you stay healthy as you get older, including: Helping you stay mobile, or capable of walking and moving around. Preventing accidents, such as falls. Increasing life expectancy. Mental health benefits Boosting your mood and improving your self-esteem. Lowering your chance of having mental health problems, such as depression or anxiety. Helping you feel good about your body. Other benefits Finding new sources of fun and enjoyment. Meeting new people who share a common interest. Before you begin If you have a chronic illness or have not been active for a while, check with your health care provider about how to get started. Ask your health care provider what activities are safe for you. Start out slowly. Walking or doing some simple chair exercises is a good place to start, especially if you have not been active before or for a long time. Set goals that you can work toward. Ask your health care provider how much exercise is  best for you. In general, most adults should: Do moderate-intensity exercise for at least 150 minutes each week (30 minutes on most days of the week) or vigorous exercise for at least 75 minutes each week, or a combination of these. Moderate-intensity exercise can include walking at a quick pace, biking, yoga, water aerobics, or gardening. Vigorous exercise involves activities that take more effort, such as jogging or running, playing sports, swimming laps, or jumping rope. Do strength exercises on at least 2 days each week. This can include weight lifting, body weight exercises, and resistance-band exercises. How to be more physically active Make a plan  Try to find activities that you enjoy. You are more likely to commit to an exercise routine if it does not feel like a chore. If you have bone or joint problems, choose low-impact exercises, like walking or swimming. Use these tips for being successful with an exercise plan: Find a workout partner for accountability. Join a group or class, such as an aerobics class, cycling class, or sports team. Make family time active. Go for a walk, bike, or swim. Include a variety of exercises each week. Consider using a fitness tracker, such as a mobile phone app or a device worn like a watch, that will count the number of steps you take each day. Many people strive to reach 10,000 steps a day. Find ways to be active in your daily routines Besides your formal exercise plans, you can find ways to do physical activity during your daily routines, such as: Walking or biking to work or to the store. Taking the stairs instead of the elevator. Parking farther away from the door at work  or at the store. Planning walking meetings. Walking around while you are on the phone. Where to find more information Centers for Disease Control and Prevention: WorkDashboard.es President's Council on Fitness, Sports & Nutrition: www.fitness.gov ChooseMyPlate:  MassVoice.es Contact a health care provider if: You have headaches, muscle aches, or joint pain that is concerning. You feel dizzy or light-headed while exercising. You faint. You feel your heart skipping, racing, or fluttering. You have chest pain while exercising. Summary Exercise benefits your mind and body at any age, even if you are just starting out. If you have a chronic illness or have not been active for a while, check with your health care provider before increasing your physical activity. Choose activities that are safe and enjoyable for you. Ask your health care provider what activities are safe for you. Start slowly. Tell your health care provider if you have problems as you start to increase your activity level. This information is not intended to replace advice given to you by your health care provider. Make sure you discuss any questions you have with your health care provider. Document Revised: 07/03/2020 Document Reviewed: 07/03/2020 Elsevier Patient Education  [place].

## 2022-02-21 NOTE — Progress Notes (Signed)
Subjective:    Patient ID: Clayton [name], male    DOB: 1961/02/19, 61 y.o.   MRN: [id number]   Chief Complaint: medical management of chronic issues     HPI:  Clayton [name] is a 61 y.o. who identifies as a male who was assigned male at birth.   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 Essential hypertension, benign No c/o chest pain, sob or headache. Does not check blood pressure at home. BP Readings from Last 3 Encounters:  11/08/21 118/66  08/02/21 131/78  06/28/21 134/69     2. Hyperlipidemia associated with type 2 diabetes mellitus ([place]) Does not watch diet very closely and does no exercise. Lab Results  Component Value Date   CHOL 142 11/08/2021   HDL 31 (L) 11/08/2021   LDLCALC 80 11/08/2021   LDLDIRECT 70 09/24/2014   TRIG 182 (H) 11/08/2021   CHOLHDL 4.6 11/08/2021      3. Type 2 diabetes mellitus without complication, without long-term current use of insulin (HCC) Fasting blood sugars are running around 130-140. Deneis any low blood sugars. Lab Results  Component Value Date   HGBA1C 7.3 (H) 11/08/2021     4. Gastroesophageal reflux disease without esophagitis Is on protonix daily and will have symptoms if he does not take meds.  5. OSA (obstructive sleep apnea) Wears cpap nightly  6. Recurrent major depressive disorder, in full remission ([place])    02/21/2022    8:31 AM 11/08/2021   10:48 AM 08/02/2021    8:42 AM  Depression screen PHQ 2/9  Decreased Interest 0 0 0  Down, Depressed, Hopeless 0 0 0  PHQ - 2 Score 0 0 0  Altered sleeping 0 1 1  Tired, decreased energy 0 0 0  Change in appetite 0 0 0  Feeling bad or failure about yourself  0 0 0  Trouble concentrating 0 0 0  Moving slowly or fidgety/restless 0 0 0  Suicidal thoughts 0 0 0  PHQ-9 Score 0 1 1  Difficult doing work/chores Not difficult at all Not difficult at  all Somewhat difficult     7. Anxiety    02/21/2022    8:31 AM 11/08/2021   10:48 AM 08/02/2021    8:42 AM 05/03/2021    9:08 AM  GAD 7 : Generalized Anxiety Score  Nervous, Anxious, on Edge 0 0 0 0  Control/stop worrying 0 0 0 0  Worry too much - different things 0 0 0 0  Trouble relaxing _0 0  Restless _1 0  Easily annoyed or irritable 0 0 0 0  Afraid - awful might happen 0 0 0 0  Total GAD 7 Score _2 0  Anxiety Difficulty Not difficult at all Not difficult at all Not difficult at all Not difficult at all      8. Tobacco use Smokes about 1 pack every 2 days  9. Morbid obesity ([place]) Weight is up 6 lbs Wt Readings from Last 3 Encounters:  02/21/22 253 lb (114.8 kg)  11/08/21 247 lb 9.6 oz (112.3 kg)  08/02/21 247 lb (112 kg)   BMI Readings from Last 3 Encounters:  02/21/22 36.30 kg/m  11/08/21 35.53 kg/m  08/02/21 35.44 kg/m     New complaints: None today  No Known Allergies Outpatient Encounter Medications as of 02/21/2022  Medication Sig  aspirin 81 MG EC tablet Take 1 tablet (81 mg total) by mouth daily. Swallow whole.   escitalopram (LEXAPRO) 10 MG tablet TAKE 1 TABLET BY MOUTH EVERY DAY   fenofibrate (TRICOR) 145 MG tablet Take 1 tablet (145 mg total) by mouth daily.   glimepiride (AMARYL) 2 MG tablet Take 1 tablet (2 mg total) by mouth daily before breakfast.   glucose blood (ONETOUCH VERIO) test strip Use to check BG 1 to 2 times daily Dx: E11.9   hydrochlorothiazide (HYDRODIURIL) 25 MG tablet Take 1 tablet (25 mg total) by mouth daily.   losartan (COZAAR) 100 MG tablet TAKE 1 TABLET BY MOUTH EVERY DAY   metFORMIN (GLUCOPHAGE) 1000 MG tablet TAKE 1 TABLET BY MOUTH TWICE A DAY WITH FOOD   nitroGLYCERIN (NITROSTAT) 0.4 MG SL tablet Place 1 tablet (0.4 mg total) under the tongue every 5 (five) minutes as needed for chest pain.   OneTouch Delica Lancets 24Q MISC Use to check BG 1 to 2 times daily.  Dx:  E11.9   pantoprazole (PROTONIX) 40 MG tablet  Take 1 tablet (40 mg total) by mouth daily.   rosuvastatin (CRESTOR) 10 MG tablet Take 1 tablet (10 mg total) by mouth daily.   No facility-administered encounter medications on file as of 02/21/2022.    Past Surgical History:  Procedure Laterality Date   ANTERIOR CRUCIATE LIGAMENT REPAIR Left 2012   Knee   COLONOSCOPY     POLYPECTOMY      Family History  Problem Relation Age of Onset   Hypertension Father    Diabetes Father    Heart attack Mother    Breast cancer Mother    Diabetes Paternal Grandfather    Colon cancer Neg Hx    Esophageal cancer Neg Hx    Rectal cancer Neg Hx    Stomach cancer Neg Hx    Prostate cancer Neg Hx    Pancreatic cancer Neg Hx       Controlled substance contract: n/a     Review of Systems  Constitutional:  Negative for diaphoresis.  Eyes:  Negative for pain.  Respiratory:  Negative for shortness of breath.   Cardiovascular:  Negative for chest pain, palpitations and leg swelling.  Gastrointestinal:  Negative for abdominal pain.  Endocrine: Negative for polydipsia.  Skin:  Negative for rash.  Neurological:  Negative for dizziness, weakness and headaches.  Hematological:  Does not bruise/bleed easily.  All other systems reviewed and are negative.      Objective:   Physical Exam Vitals and nursing note reviewed.  Constitutional:      Appearance: Normal appearance. He is well-developed.  HENT:     Head: Normocephalic.     Nose: Nose normal.     Mouth/Throat:     Mouth: Mucous membranes are moist.     Pharynx: Oropharynx is clear.  Eyes:     Pupils: Pupils are equal, round, and reactive to light.  Neck:     Thyroid: No thyroid mass or thyromegaly.     Vascular: No carotid bruit or JVD.     Trachea: Phonation normal.  Cardiovascular:     Rate and Rhythm: Normal rate and regular rhythm.  Pulmonary:     Effort: Pulmonary effort is normal. No respiratory distress.     Breath sounds: Normal breath sounds.  Abdominal:      General: Bowel sounds are normal.     Palpations: Abdomen is soft.     Tenderness: There is no abdominal tenderness.  Musculoskeletal:  General: Normal range of motion.     Cervical back: Normal range of motion and neck supple.  Lymphadenopathy:     Cervical: No cervical adenopathy.  Skin:    General: Skin is warm and dry.  Neurological:     Mental Status: He is alert and oriented to person, place, and time.  Psychiatric:        Behavior: Behavior normal.        Thought Content: Thought content normal.        Judgment: Judgment normal.     BP 135/77   Pulse 64   Temp 98.1 F (36.7 C) (Temporal)   Resp 20   Ht _0  (1.778 m)   Wt 253 lb (114.8 kg)   SpO2 96%   BMI 36.30 kg/m   Hgba1c 7.6%      Assessment & Plan:   ZAI CHMIEL comes in today with chief complaint of Medical Management of Chronic Issues   Diagnosis and orders addressed:  1. Essential hypertension, benign Low sodium diet - CBC with Differential/Platelet - CMP14+EGFR - hydrochlorothiazide (HYDRODIURIL) 25 MG tablet; Take 1 tablet (25 mg total) by mouth daily.  Dispense: 90 tablet; Refill: 1 - losartan (COZAAR) 100 MG tablet; TAKE 1 TABLET BY MOUTH EVERY DAY  Dispense: 90 tablet; Refill: 1  2. Hyperlipidemia associated with type 2 diabetes mellitus (HCC) Low fat diet - Lipid panel - fenofibrate (TRICOR) 145 MG tablet; Take 1 tablet (145 mg total) by mouth daily.  Dispense: 90 tablet; Refill: 1 - rosuvastatin (CRESTOR) 10 MG tablet; Take 1 tablet (10 mg total) by mouth daily.  Dispense: 90 tablet; Refill: 1  3. Type 2 diabetes mellitus without complication, without long-term current use of insulin (HCC) Stricter carb counting - Bayer DCA Hb A1c Waived - glimepiride (AMARYL) 2 MG tablet; Take 1 tablet (2 mg total) by mouth daily before breakfast.  Dispense: 90 tablet; Refill: 1 - metFORMIN (GLUCOPHAGE) 1000 MG tablet; TAKE 1 TABLET BY MOUTH TWICE A DAY WITH FOOD  Dispense: 180 tablet;  Refill: 1  4. Gastroesophageal reflux disease without esophagitis Avoid spicy foods Do not eat 2 hours prior to bedtime - pantoprazole (PROTONIX) 40 MG tablet; Take 1 tablet (40 mg total) by mouth daily.  Dispense: 90 tablet; Refill: 1  5. OSA (obstructive sleep apnea) Continue to wear cpap  6. Recurrent major depressive disorder, in full remission ([place]) Stress management - escitalopram (LEXAPRO) 10 MG tablet; TAKE 1 TABLET BY MOUTH EVERY DAY  Dispense: 90 tablet; Refill: 1  7. Anxiety  8. Tobacco use Smoking cessation encouraged  9. Morbid obesity ([place]) Discussed diet and exercise for person with BMI >25 Will recheck weight in 3-6 months    Labs pending Health Maintenance reviewed Diet and exercise encouraged  Follow up plan: 3 months   Mary-Margaret Hassell Done, FNP

## 2022-02-22 LAB — CMP14+EGFR
ALT: 18 IU/L (ref 0–44)
AST: 12 IU/L (ref 0–40)
Albumin/Globulin Ratio: 1.7 (ref 1.2–2.2)
Albumin: 4.4 g/dL (ref 3.8–4.9)
Alkaline Phosphatase: 86 IU/L (ref 44–121)
BUN/Creatinine Ratio: 18 (ref 9–20)
BUN: 15 mg/dL (ref 6–24)
Bilirubin Total: 0.4 mg/dL (ref 0.0–1.2)
CO2: 23 mmol/L (ref 20–29)
Calcium: 9.8 mg/dL (ref 8.7–10.2)
Chloride: 97 mmol/L (ref 96–106)
Creatinine, Ser: 0.84 mg/dL (ref 0.76–1.27)
Globulin, Total: 2.6 g/dL (ref 1.5–4.5)
Glucose: 150 mg/dL — ABNORMAL HIGH (ref 70–99)
Potassium: 4.1 mmol/L (ref 3.5–5.2)
Sodium: 139 mmol/L (ref 134–144)
Total Protein: 7 g/dL (ref 6.0–8.5)
eGFR: 100 mL/min/{1.73_m2} (ref >59)

## 2022-02-22 LAB — LIPID PANEL
Chol/HDL Ratio: 3.5 ratio (ref 0.0–5.0)
Cholesterol, Total: 76 mg/dL — ABNORMAL LOW (ref 100–199)
HDL: 22 mg/dL — ABNORMAL LOW (ref >39)
LDL Chol Calc (NIH): 30 mg/dL (ref 0–99)
Triglycerides: 134 mg/dL (ref 0–149)
VLDL Cholesterol Cal: 24 mg/dL (ref 5–40)

## 2022-02-22 LAB — CBC WITH DIFFERENTIAL/PLATELET
Basophils Absolute: 0.1 10*3/uL (ref 0.0–0.2)
Basos: 1 %
EOS (ABSOLUTE): 0.3 10*3/uL (ref 0.0–0.4)
Eos: 3 %
Hematocrit: 41.2 % (ref 37.5–51.0)
Hemoglobin: 13.5 g/dL (ref 13.0–17.7)
Immature Grans (Abs): 0 10*3/uL (ref 0.0–0.1)
Immature Granulocytes: 0 %
Lymphocytes Absolute: 2.8 10*3/uL (ref 0.7–3.1)
Lymphs: 34 %
MCH: 30.6 pg (ref 26.6–33.0)
MCHC: 32.8 g/dL (ref 31.5–35.7)
MCV: 93 fL (ref 79–97)
Monocytes Absolute: 0.7 10*3/uL (ref 0.1–0.9)
Monocytes: 8 %
Neutrophils Absolute: 4.5 10*3/uL (ref 1.4–7.0)
Neutrophils: 54 %
Platelets: 252 10*3/uL (ref 150–450)
RBC: 4.41 x10E6/uL (ref 4.14–5.80)
RDW: 12.2 % (ref 11.6–15.4)
WBC: 8.3 10*3/uL (ref 3.4–10.8)

## 2022-03-29 NOTE — Progress Notes (Signed)
Received referral for initial lung cancer screening scan. Patient states that he is not interested in LCS at this time but will discuss further with his PCP at his March appt. Advised patient that if he is interested at any point, his PCP can re-refer him or he can call me directly. Referral closed at this time.

## 2022-05-24 ENCOUNTER — Ambulatory Visit: Payer: BC Managed Care – PPO | Admitting: Nurse Practitioner

## 2022-05-30 ENCOUNTER — Ambulatory Visit (INDEPENDENT_AMBULATORY_CARE_PROVIDER_SITE_OTHER): Payer: BC Managed Care – PPO | Admitting: Nurse Practitioner

## 2022-05-30 ENCOUNTER — Encounter: Payer: Self-pay | Admitting: Nurse Practitioner

## 2022-05-30 VITALS — BP 147/86 | HR 70 | Temp 98.2°F | Resp 20 | Ht 70.0 in | Wt 254.0 lb

## 2022-05-30 DIAGNOSIS — F419 Anxiety disorder, unspecified: Secondary | ICD-10-CM

## 2022-05-30 DIAGNOSIS — G4733 Obstructive sleep apnea (adult) (pediatric): Secondary | ICD-10-CM

## 2022-05-30 DIAGNOSIS — E1169 Type 2 diabetes mellitus with other specified complication: Secondary | ICD-10-CM

## 2022-05-30 DIAGNOSIS — K219 Gastro-esophageal reflux disease without esophagitis: Secondary | ICD-10-CM

## 2022-05-30 DIAGNOSIS — I10 Essential (primary) hypertension: Secondary | ICD-10-CM

## 2022-05-30 DIAGNOSIS — E349 Endocrine disorder, unspecified: Secondary | ICD-10-CM

## 2022-05-30 DIAGNOSIS — F3342 Major depressive disorder, recurrent, in full remission: Secondary | ICD-10-CM

## 2022-05-30 DIAGNOSIS — E119 Type 2 diabetes mellitus without complications: Secondary | ICD-10-CM | POA: Diagnosis not present

## 2022-05-30 DIAGNOSIS — Z720 Tobacco use: Secondary | ICD-10-CM

## 2022-05-30 DIAGNOSIS — E785 Hyperlipidemia, unspecified: Secondary | ICD-10-CM

## 2022-05-30 LAB — BAYER DCA HB A1C WAIVED: HB A1C (BAYER DCA - WAIVED): 8.3 % — ABNORMAL HIGH (ref 4.8–5.6)

## 2022-05-30 NOTE — Progress Notes (Signed)
Subjective:    Patient ID: Clayton [name], male    DOB: 11/06/1960, 62 y.o.   MRN: [id number]  Chief Complaint: annual physical   HPI:  Clayton [name] is a 62 y.o. who identifies as a male who was assigned male at birth.   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 Essential hypertension, benign No c/o chest pain, sob or headache. Does not check blood pressure at home. BP Readings from Last 3 Encounters:  02/21/22 135/77  11/08/21 118/66  08/02/21 131/78     2. Hyperlipidemia associated with type 2 diabetes mellitus ([place]) Does not watch diet and does no dedicated exercise. Lab Results  Component Value Date   CHOL 76 (L) 02/21/2022   HDL 22 (L) 02/21/2022   LDLCALC 30 02/21/2022   LDLDIRECT 70 09/24/2014   TRIG 134 02/21/2022   CHOLHDL 3.5 02/21/2022     3. Type 2 diabetes mellitus without complication, without long-term current use of insulin (HCC) Fasting bnlood sugars are running around 120-150. No  low blood sugars. He has not been watcing his diet or doing any exercise causae it has been so cold and rainy Lab Results  Component Value Date   HGBA1C 7.6 (H) 02/21/2022     4. Gastroesophageal reflux disease without esophagitis Is on protonix and is doing well.  5. OSA (obstructive sleep apnea) Wears cpap nightly.  6. Testosterone deficiency Lab Results  Component Value Date   TESTOSTERONE 351 01/11/2021     7. Recurrent major depressive disorder, in full remission (Clayton [name]) Is on lexapro daily and is doing well.    05/30/2022    9:19 AM 02/21/2022    8:31 AM 11/08/2021   10:48 AM  Depression screen PHQ 2/9  Decreased Interest 0 0 0  Down, Depressed, Hopeless 0 0 0  PHQ - 2 Score 0 0 0  Altered sleeping 0 0 1  Tired, decreased energy 0 0 0  Change in appetite 0 0 0  Feeling bad or failure about yourself  0 0 0  Trouble concentrating 0 0 0   Moving slowly or fidgety/restless 0 0 0  Suicidal thoughts 0 0 0  PHQ-9 Score 0 0 1  Difficult doing work/chores Not difficult at all Not difficult at all Not difficult at all      8. Anxiety    05/30/2022    9:19 AM 02/21/2022    8:31 AM 11/08/2021   10:48 AM 08/02/2021    8:42 AM  GAD 7 : Generalized Anxiety Score  Nervous, Anxious, on Edge 0 0 0 0  Control/stop worrying 0 0 0 0  Worry too much - different things 0 0 0 0  Trouble relaxing '1 1 1 1  '$ Restless '1 1 1 1  '$ Easily annoyed or irritable 0 0 0 0  Afraid - awful might happen 0 0 0 0  Total GAD 7 Score '2 2 2 2  '$ Anxiety Difficulty Somewhat difficult Not difficult at all Not difficult at all Not difficult at all      9. Tobacco use Smokes about a pack a day.  10. Morbid obesity (Clayton [name]) No recent weight changes Wt Readings from Last 3 Encounters:  05/30/22 254 lb (115.2 kg)  02/21/22 253 lb (114.8 kg)  11/08/21 247 lb 9.6 oz (112.3 kg)   BMI Readings from Last 3 Encounters:  05/30/22 36.45 kg/m  02/21/22 36.30 kg/m  11/08/21 35.53 kg/m     New complaints: None today  No Known Allergies Outpatient Encounter Medications as of 05/30/2022  Medication Sig   aspirin 81 MG EC tablet Take 1 tablet (81 mg total) by mouth daily. Swallow whole.   escitalopram (LEXAPRO) 10 MG tablet TAKE 1 TABLET BY MOUTH EVERY DAY   fenofibrate (TRICOR) 145 MG tablet Take 1 tablet (145 mg total) by mouth daily.   glimepiride (AMARYL) 2 MG tablet Take 1 tablet (2 mg total) by mouth daily before breakfast.   glucose blood (ONETOUCH VERIO) test strip Use to check BG 1 to 2 times daily Dx: E11.9   hydrochlorothiazide (HYDRODIURIL) 25 MG tablet Take 1 tablet (25 mg total) by mouth daily.   losartan (COZAAR) 100 MG tablet TAKE 1 TABLET BY MOUTH EVERY DAY   metFORMIN (GLUCOPHAGE) 1000 MG tablet TAKE 1 TABLET BY MOUTH TWICE A DAY WITH FOOD   nitroGLYCERIN (NITROSTAT) 0.4 MG SL tablet Place 1 tablet (0.4 mg total) under the tongue every 5  (five) minutes as needed for chest pain.   OneTouch Delica Lancets 99991111 MISC Use to check BG 1 to 2 times daily.  Dx:  E11.9   pantoprazole (PROTONIX) 40 MG tablet Take 1 tablet (40 mg total) by mouth daily.   rosuvastatin (CRESTOR) 10 MG tablet Take 1 tablet (10 mg total) by mouth daily.   No facility-administered encounter medications on file as of 05/30/2022.    Past Surgical History:  Procedure Laterality Date   ANTERIOR CRUCIATE LIGAMENT REPAIR Left 2012   Knee   COLONOSCOPY     POLYPECTOMY      Family History  Problem Relation Age of Onset   Hypertension Father    Diabetes Father    Heart attack Mother    Breast cancer Mother    Diabetes Paternal Grandfather    Colon cancer Neg Hx    Esophageal cancer Neg Hx    Rectal cancer Neg Hx    Stomach cancer Neg Hx    Prostate cancer Neg Hx    Pancreatic cancer Neg Hx       Controlled substance contract: n/a     Review of Systems  Constitutional:  Negative for diaphoresis.  Eyes:  Negative for pain.  Respiratory:  Negative for shortness of breath.   Cardiovascular:  Negative for chest pain, palpitations and leg swelling.  Gastrointestinal:  Negative for abdominal pain.  Endocrine: Negative for polydipsia.  Skin:  Negative for rash.  Neurological:  Negative for dizziness, weakness and headaches.  Hematological:  Does not bruise/bleed easily.  All other systems reviewed and are negative.      Objective:   Physical Exam Vitals and nursing note reviewed.  Constitutional:      Appearance: Normal appearance. He is well-developed.  HENT:     Head: Normocephalic.     Nose: Nose normal.     Mouth/Throat:     Mouth: Mucous membranes are moist.     Pharynx: Oropharynx is clear.  Eyes:     Pupils: Pupils are equal, round, and reactive to light.  Neck:     Thyroid: No thyroid mass or thyromegaly.     Vascular: No carotid bruit or JVD.     Trachea: Phonation normal.  Cardiovascular:     Rate and Rhythm: Normal rate  and regular rhythm.  Pulmonary:     Effort: Pulmonary effort is normal. No respiratory distress.     Breath sounds: Normal breath sounds.  Abdominal:     General: Bowel sounds are normal.     Palpations: Abdomen is soft.     Tenderness: There is no abdominal tenderness.  Musculoskeletal:        General: Normal range of motion.     Cervical back: Normal range of motion and neck supple.  Lymphadenopathy:     Cervical: No cervical adenopathy.  Skin:    General: Skin is warm and dry.  Neurological:     Mental Status: He is alert and oriented to person, place, and Clayton [name].  Psychiatric:        Behavior: Behavior normal.        Thought Content: Thought content normal.        Judgment: Judgment normal.     BP (!) 147/86   Pulse 70   Temp 98.2 F (36.8 C) (Temporal)   Resp 20   Ht '5\' 10"'$  (1.778 m)   Wt 254 lb (115.2 kg)   SpO2 96%   BMI 36.45 kg/m   HGBA1c 8.3%      Assessment & Plan:   Clayton [name] comes in today with chief complaint of Annual Exam   Diagnosis and orders addressed:  1. Essential hypertension, benign Low sodium diet - CBC with Differential/Platelet - CMP14+EGFR  2. Hyperlipidemia associated with type 2 diabetes mellitus (HCC) Low fat diet - Lipid panel  3. Type 2 diabetes mellitus without complication, without long-term current use of insulin (HCC) Stricter carb counting. Wants to get down with exercise - Bayer DCA Hb A1c Waived - Microalbumin / creatinine urine ratio  4. Gastroesophageal reflux disease without esophagitis Avoid spicy foods Do not eat 2 hours prior to bedtime   5. OSA (obstructive sleep apnea) Continue to wear CPAP machine  6. Testosterone deficiency Labs pending  7. Recurrent major depressive disorder, in full remission (Clayton [name]) Stress management  8. Anxiety   9. Tobacco use Smoking cessation encouraged  10. Morbid obesity (Clayton [name]) Discussed diet and exercise for person with BMI >25 Will recheck weight in 3-6  months    Labs pending Health Maintenance reviewed Diet and exercise encouraged  Follow up plan: 3 months   Mary-Margaret Hassell Done, FNP

## 2022-05-30 NOTE — Patient Instructions (Signed)

## 2022-05-31 LAB — CMP14+EGFR
ALT: 15 IU/L (ref 0–44)
AST: 13 IU/L (ref 0–40)
Albumin/Globulin Ratio: 1.6 (ref 1.2–2.2)
Albumin: 4.5 g/dL (ref 3.8–4.9)
Alkaline Phosphatase: 87 IU/L (ref 44–121)
BUN/Creatinine Ratio: 26 — ABNORMAL HIGH (ref 10–24)
BUN: 21 mg/dL (ref 8–27)
Bilirubin Total: 0.6 mg/dL (ref 0.0–1.2)
CO2: 26 mmol/L (ref 20–29)
Calcium: 10.2 mg/dL (ref 8.6–10.2)
Chloride: 99 mmol/L (ref 96–106)
Creatinine, Ser: 0.81 mg/dL (ref 0.76–1.27)
Globulin, Total: 2.9 g/dL (ref 1.5–4.5)
Glucose: 170 mg/dL — ABNORMAL HIGH (ref 70–99)
Potassium: 4.9 mmol/L (ref 3.5–5.2)
Sodium: 139 mmol/L (ref 134–144)
Total Protein: 7.4 g/dL (ref 6.0–8.5)
eGFR: 101 mL/min/{1.73_m2} (ref >59)

## 2022-05-31 LAB — LIPID PANEL
Chol/HDL Ratio: 4.5 ratio (ref 0.0–5.0)
Cholesterol, Total: 131 mg/dL (ref 100–199)
HDL: 29 mg/dL — ABNORMAL LOW (ref >39)
LDL Chol Calc (NIH): 70 mg/dL (ref 0–99)
Triglycerides: 189 mg/dL — ABNORMAL HIGH (ref 0–149)
VLDL Cholesterol Cal: 32 mg/dL (ref 5–40)

## 2022-05-31 LAB — CBC WITH DIFFERENTIAL/PLATELET
Basophils Absolute: 0.1 10*3/uL (ref 0.0–0.2)
Basos: 1 %
EOS (ABSOLUTE): 0.3 10*3/uL (ref 0.0–0.4)
Eos: 2 %
Hematocrit: 43.1 % (ref 37.5–51.0)
Hemoglobin: 14.1 g/dL (ref 13.0–17.7)
Immature Grans (Abs): 0 10*3/uL (ref 0.0–0.1)
Immature Granulocytes: 0 %
Lymphocytes Absolute: 2.5 10*3/uL (ref 0.7–3.1)
Lymphs: 23 %
MCH: 30.4 pg (ref 26.6–33.0)
MCHC: 32.7 g/dL (ref 31.5–35.7)
MCV: 93 fL (ref 79–97)
Monocytes Absolute: 0.8 10*3/uL (ref 0.1–0.9)
Monocytes: 7 %
Neutrophils Absolute: 7.3 10*3/uL — ABNORMAL HIGH (ref 1.4–7.0)
Neutrophils: 67 %
Platelets: 262 10*3/uL (ref 150–450)
RBC: 4.64 x10E6/uL (ref 4.14–5.80)
RDW: 12.6 % (ref 11.6–15.4)
WBC: 10.9 10*3/uL — ABNORMAL HIGH (ref 3.4–10.8)

## 2022-05-31 LAB — MICROALBUMIN / CREATININE URINE RATIO
Creatinine, Urine: 125.9 mg/dL
Microalb/Creat Ratio: 17 mg/g creat (ref 0–29)
Microalbumin, Urine: 21.9 ug/mL

## 2022-09-05 ENCOUNTER — Ambulatory Visit: Payer: BC Managed Care – PPO | Admitting: Nurse Practitioner

## 2022-09-05 ENCOUNTER — Encounter: Payer: Self-pay | Admitting: Nurse Practitioner

## 2022-09-05 VITALS — BP 133/76 | HR 67 | Temp 98.4°F | Resp 20 | Ht 70.0 in | Wt 252.0 lb

## 2022-09-05 DIAGNOSIS — G4733 Obstructive sleep apnea (adult) (pediatric): Secondary | ICD-10-CM

## 2022-09-05 DIAGNOSIS — I10 Essential (primary) hypertension: Secondary | ICD-10-CM

## 2022-09-05 DIAGNOSIS — Z7984 Long term (current) use of oral hypoglycemic drugs: Secondary | ICD-10-CM

## 2022-09-05 DIAGNOSIS — F419 Anxiety disorder, unspecified: Secondary | ICD-10-CM

## 2022-09-05 DIAGNOSIS — E349 Endocrine disorder, unspecified: Secondary | ICD-10-CM

## 2022-09-05 DIAGNOSIS — E785 Hyperlipidemia, unspecified: Secondary | ICD-10-CM

## 2022-09-05 DIAGNOSIS — E1169 Type 2 diabetes mellitus with other specified complication: Secondary | ICD-10-CM

## 2022-09-05 DIAGNOSIS — K219 Gastro-esophageal reflux disease without esophagitis: Secondary | ICD-10-CM

## 2022-09-05 DIAGNOSIS — M25561 Pain in right knee: Secondary | ICD-10-CM

## 2022-09-05 DIAGNOSIS — Z125 Encounter for screening for malignant neoplasm of prostate: Secondary | ICD-10-CM

## 2022-09-05 DIAGNOSIS — E119 Type 2 diabetes mellitus without complications: Secondary | ICD-10-CM

## 2022-09-05 DIAGNOSIS — F3342 Major depressive disorder, recurrent, in full remission: Secondary | ICD-10-CM

## 2022-09-05 DIAGNOSIS — Z720 Tobacco use: Secondary | ICD-10-CM

## 2022-09-05 LAB — CMP14+EGFR

## 2022-09-05 LAB — BAYER DCA HB A1C WAIVED: HB A1C (BAYER DCA - WAIVED): 8.6 % — ABNORMAL HIGH (ref 4.8–5.6)

## 2022-09-05 LAB — PSA, TOTAL AND FREE

## 2022-09-05 LAB — LIPID PANEL

## 2022-09-05 MED ORDER — METFORMIN HCL 1000 MG PO TABS
ORAL_TABLET | ORAL | 1 refills | Status: DC
Start: 2022-09-05 — End: 2022-12-12

## 2022-09-05 MED ORDER — FENOFIBRATE 145 MG PO TABS
145.0000 mg | ORAL_TABLET | Freq: Every day | ORAL | 1 refills | Status: DC
Start: 2022-09-05 — End: 2023-03-13

## 2022-09-05 MED ORDER — GLIMEPIRIDE 2 MG PO TABS: 2.0000 mg | ORAL_TABLET | Freq: Every day | ORAL | 1 refills | Status: DC

## 2022-09-05 MED ORDER — ROSUVASTATIN CALCIUM 10 MG PO TABS: 10.0000 mg | ORAL_TABLET | Freq: Every day | ORAL | 1 refills | Status: DC

## 2022-09-05 MED ORDER — GLIMEPIRIDE 4 MG PO TABS
4.0000 mg | ORAL_TABLET | Freq: Every day | ORAL | 1 refills | Status: DC
Start: 2022-09-05 — End: 2022-12-13

## 2022-09-05 MED ORDER — HYDROCHLOROTHIAZIDE 25 MG PO TABS: 25.0000 mg | ORAL_TABLET | Freq: Every day | ORAL | 1 refills | Status: DC

## 2022-09-05 MED ORDER — ESCITALOPRAM OXALATE 10 MG PO TABS
ORAL_TABLET | ORAL | 1 refills | Status: DC
Start: 2022-09-05 — End: 2023-03-13

## 2022-09-05 MED ORDER — LOSARTAN POTASSIUM 100 MG PO TABS
ORAL_TABLET | ORAL | 1 refills | Status: DC
Start: 2022-09-05 — End: 2022-12-12

## 2022-09-05 MED ORDER — PANTOPRAZOLE SODIUM 40 MG PO TBEC
40.0000 mg | DELAYED_RELEASE_TABLET | Freq: Every day | ORAL | 1 refills | Status: DC
Start: 2022-09-05 — End: 2023-03-13

## 2022-09-05 NOTE — Progress Notes (Signed)
Subjective:    Patient ID: Clayton [name], male    DOB: 1960-12-22, 62 y.o.   MRN: [id number]   Chief Complaint: medical management of chronic issues     HPI:  Clayton [name] is a 62 y.o. who identifies as a male who was assigned male at birth.   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 Essential hypertension, benign No c/o chest pain, sob or headache. Does not check blood pressure at home. BP Readings from Last 3 Encounters:  05/30/22 (!) 147/86  02/21/22 135/77  11/08/21 118/66     2. Hyperlipidemia associated with type 2 diabetes mellitus (HCC) Does not watch diet and does no dedicated exercise. Lab Results  Component Value Date   CHOL 131 05/30/2022   HDL 29 (L) 05/30/2022   LDLCALC 70 05/30/2022   LDLDIRECT 70 09/24/2014   TRIG 189 (H) 05/30/2022   CHOLHDL 4.5 05/30/2022   The 10-year ASCVD risk score (Arnett DK, et al., 2019) is: 30.2%   3. Diabetes mellitus treated with oral medication (HCC) Does not check blood sugars everyday. Is on oral meds only. Patient was told at last visit if hgba1c did not improve he will need to go on some type of injectable med Lab Results  Component Value Date   HGBA1C 8.3 (H) 05/30/2022     4. Gastroesophageal reflux disease without esophagitis Has to take protonix daily to keep symptoms under control.  5. OSA (obstructive sleep apnea) Wears cpap nightly. Says he feels rested when he wakes up  6. Testosterone deficiency On no testosterone supplements Lab Results  Component Value Date   TESTOSTERONE 351 01/11/2021     7. Anxiety 8. Recurrent major depressive disorder, in full remission (HCC) Is on lexapro daily and is doing well.    09/05/2022   10:30 AM 05/30/2022    9:19 AM 02/21/2022    8:31 AM 11/08/2021   10:48 AM  GAD 7 : Generalized Anxiety Score  Nervous, Anxious, on  Edge 0 0 0 0  Control/stop worrying 0 0 0 0  Worry too much - different things 0 0 0 0  Trouble relaxing 2 1 1 1   Restless 2 1 1 1   Easily annoyed or irritable 0 0 0 0  Afraid - awful might happen 0 0 0 0  Total GAD 7 Score 4 2 2 2   Anxiety Difficulty Somewhat difficult Somewhat difficult Not difficult at all Not difficult at all       09/05/2022   10:29 AM 05/30/2022    9:19 AM 02/21/2022    8:31 AM  Depression screen PHQ 2/9  Decreased Interest 0 0 0  Down, Depressed, Hopeless 0 0 0  PHQ - 2 Score 0 0 0  Altered sleeping 2 0 0  Tired, decreased energy 0 0 0  Change in appetite 0 0 0  Feeling bad or failure about yourself  0 0 0  Trouble concentrating 0 0 0  Moving slowly or fidgety/restless 0 0 0  Suicidal thoughts 0 0 0  PHQ-9 Score 2 0 0  Difficult doing work/chores Not difficult at all Not difficult at all Not difficult at all     9. Tobacco use Still smoking over a pack a day.  10. Morbid obesity (HCC) No recent weight changes Wt Readings from Last 3 Encounters:  09/05/22 252 lb (114.3 kg)  05/30/22 254 lb (115.2 kg)  02/21/22 253 lb (114.8 kg)   BMI Readings from Last 3 Encounters:  09/05/22 36.16 kg/m  05/30/22 36.45 kg/m  02/21/22 36.30 kg/m     New complaints: Right knee pain- started 3-4 weeks ago. Pain when walking. Cannot squat at all. Rates pain 9-10. Some edema.  No Known Allergies Outpatient Encounter Medications as of 09/05/2022  Medication Sig   aspirin 81 MG EC tablet Take 1 tablet (81 mg total) by mouth daily. Swallow whole.   escitalopram (LEXAPRO) 10 MG tablet TAKE 1 TABLET BY MOUTH EVERY DAY   fenofibrate (TRICOR) 145 MG tablet Take 1 tablet (145 mg total) by mouth daily.   glimepiride (AMARYL) 2 MG tablet Take 1 tablet (2 mg total) by mouth daily before breakfast.   glucose blood (ONETOUCH VERIO) test strip Use to check BG 1 to 2 times daily Dx: E11.9   hydrochlorothiazide (HYDRODIURIL) 25 MG tablet Take 1 tablet (25 mg total) by  mouth daily.   losartan (COZAAR) 100 MG tablet TAKE 1 TABLET BY MOUTH EVERY DAY   metFORMIN (GLUCOPHAGE) 1000 MG tablet TAKE 1 TABLET BY MOUTH TWICE A DAY WITH FOOD   nitroGLYCERIN (NITROSTAT) 0.4 MG SL tablet Place 1 tablet (0.4 mg total) under the tongue every 5 (five) minutes as needed for chest pain.   OneTouch Delica Lancets 33G MISC Use to check BG 1 to 2 times daily.  Dx:  E11.9   pantoprazole (PROTONIX) 40 MG tablet Take 1 tablet (40 mg total) by mouth daily.   rosuvastatin (CRESTOR) 10 MG tablet Take 1 tablet (10 mg total) by mouth daily.   No facility-administered encounter medications on file as of 09/05/2022.    Past Surgical History:  Procedure Laterality Date   ANTERIOR CRUCIATE LIGAMENT REPAIR Left 2012   Knee   COLONOSCOPY     POLYPECTOMY      Family History  Problem Relation Age of Onset   Hypertension Father    Diabetes Father    Heart attack Mother    Breast cancer Mother    Diabetes Paternal Grandfather    Colon cancer Neg Hx    Esophageal cancer Neg Hx    Rectal cancer Neg Hx    Stomach cancer Neg Hx    Prostate cancer Neg Hx    Pancreatic cancer Neg Hx       Controlled substance contract: n/a     Review of Systems  Constitutional:  Negative for diaphoresis.  Eyes:  Negative for pain.  Respiratory:  Negative for shortness of breath.   Cardiovascular:  Negative for chest pain, palpitations and leg swelling.  Gastrointestinal:  Negative for abdominal pain.  Endocrine: Negative for polydipsia.  Skin:  Negative for rash.  Neurological:  Negative for dizziness, weakness and headaches.  Hematological:  Does not bruise/bleed easily.  All other systems reviewed and are negative.      Objective:   Physical Exam Vitals and nursing note reviewed.  Constitutional:      Appearance: Normal appearance. He is well-developed.  HENT:     Head: Normocephalic.     Nose: Nose normal.     Mouth/Throat:     Mouth: Mucous membranes are moist.     Pharynx:  Oropharynx is clear.  Eyes:     Pupils: Pupils are equal, round, and reactive to light.  Neck:     Thyroid: No thyroid mass or thyromegaly.     Vascular: No carotid bruit or JVD.  Trachea: Phonation normal.  Cardiovascular:     Rate and Rhythm: Normal rate and regular rhythm.  Pulmonary:     Effort: Pulmonary effort is normal. No respiratory distress.     Breath sounds: Normal breath sounds.  Abdominal:     General: Bowel sounds are normal.     Palpations: Abdomen is soft.     Tenderness: There is no abdominal tenderness.  Musculoskeletal:        General: Normal range of motion.     Cervical back: Normal range of motion and neck supple.     Comments: FROM of right knee Mid knee effusion All ligaments intact  Lymphadenopathy:     Cervical: No cervical adenopathy.  Skin:    General: Skin is warm and dry.  Neurological:     Mental Status: He is alert and oriented to person, place, and time.  Psychiatric:        Behavior: Behavior normal.        Thought Content: Thought content normal.        Judgment: Judgment normal.    BP 133/76   Pulse 67   Temp 98.4 F (36.9 C) (Temporal)   Resp 20   Ht 5\' 10"  (1.778 m)   Wt 252 lb (114.3 kg)   SpO2 95%   BMI 36.16 kg/m   HBA1c 8.3%      Assessment & Plan:  Clayton [name] comes in today with chief complaint of Medical Management of Chronic Issues   Diagnosis and orders addressed:  1. Essential hypertension, benign Low sodium diet - CBC with Differential/Platelet - CMP14+EGFR - hydrochlorothiazide (HYDRODIURIL) 25 MG tablet; Take 1 tablet (25 mg total) by mouth daily.  Dispense: 90 tablet; Refill: 1 - losartan (COZAAR) 100 MG tablet; TAKE 1 TABLET BY MOUTH EVERY DAY  Dispense: 90 tablet; Refill: 1  2. Hyperlipidemia associated with type 2 diabetes mellitus (HCC) [place] diet - Lipid panel - fenofibrate (TRICOR) 145 MG tablet; Take 1 tablet (145 mg total) by mouth daily.  Dispense: 90 tablet; Refill: 1 -  rosuvastatin (CRESTOR) 10 MG tablet; Take 1 tablet (10 mg total) by mouth daily.  Dispense: 90 tablet; Refill: 1  3. Diabetes mellitus treated with oral medication (HCC) Continue to watch carbs in diet - Bayer DCA Hb A1c Waived - glimepiride (AMARYL) 2 MG tablet; Take 1 tablet (2 mg total) by mouth daily before breakfast.  Dispense: 90 tablet; Refill: 1 - metFORMIN (GLUCOPHAGE) 1000 MG tablet; TAKE 1 TABLET BY MOUTH TWICE A DAY WITH FOOD  Dispense: 180 tablet; Refill: 1  4. Gastroesophageal reflux disease without esophagitis Avoid spicy foods Do not eat 2 hours prior to bedtime - pantoprazole (PROTONIX) 40 MG tablet; Take 1 tablet (40 mg total) by mouth daily.  Dispense: 90 tablet; Refill: 1  5. OSA (obstructive sleep apnea) Wear cpap  6. Testosterone deficiency Refuse testosterone  7. Anxiety Stress management  8. Recurrent major depressive disorder, in full remission (HCC) - escitalopram (LEXAPRO) 10 MG tablet; TAKE 1 TABLET BY MOUTH EVERY DAY  Dispense: 90 tablet; Refill: 1  9. Tobacco use Smoking cessation encouraged  10. Morbid obesity (HCC) Discussed diet and exercise for person with BMI >25 Will recheck weight in 3-6 months   11. Prostate cancer screening Labs pending - PSA, total and free  12. Right knee pain Referral to ortho  Labs pending Health Maintenance reviewed Diet and exercise encouraged  Follow up plan: 3 months   Mary-Margaret Daphine Deutscher, FNP

## 2022-09-05 NOTE — Patient Instructions (Signed)

## 2022-09-06 LAB — CBC WITH DIFFERENTIAL/PLATELET
Basophils Absolute: 0.1 10*3/uL (ref 0.0–0.2)
Basos: 1 %
EOS (ABSOLUTE): 0.4 10*3/uL (ref 0.0–0.4)
Eos: 4 %
Hematocrit: 39.6 % (ref 37.5–51.0)
Hemoglobin: 13.1 g/dL (ref 13.0–17.7)
Immature Grans (Abs): 0 10*3/uL (ref 0.0–0.1)
Immature Granulocytes: 0 %
Lymphocytes Absolute: 2.4 10*3/uL (ref 0.7–3.1)
Lymphs: 26 %
MCH: 30.3 pg (ref 26.6–33.0)
MCHC: 33.1 g/dL (ref 31.5–35.7)
MCV: 92 fL (ref 79–97)
Monocytes Absolute: 0.8 10*3/uL (ref 0.1–0.9)
Monocytes: 9 %
Neutrophils Absolute: 5.6 10*3/uL (ref 1.4–7.0)
Neutrophils: 60 %
Platelets: 250 10*3/uL (ref 150–450)
RBC: 4.33 x10E6/uL (ref 4.14–5.80)
RDW: 12.5 % (ref 11.6–15.4)
WBC: 9.3 10*3/uL (ref 3.4–10.8)

## 2022-09-06 LAB — CMP14+EGFR
ALT: 18 IU/L (ref 0–44)
Albumin: 4.3 g/dL (ref 3.8–4.9)
Alkaline Phosphatase: 77 IU/L (ref 44–121)
BUN/Creatinine Ratio: 22 (ref 10–24)
Bilirubin Total: 0.7 mg/dL (ref 0.0–1.2)
CO2: 25 mmol/L (ref 20–29)
Calcium: 9.6 mg/dL (ref 8.6–10.2)
Chloride: 98 mmol/L (ref 96–106)
Creatinine, Ser: 0.97 mg/dL (ref 0.76–1.27)
Globulin, Total: 2.5 g/dL (ref 1.5–4.5)
Glucose: 167 mg/dL — ABNORMAL HIGH (ref 70–99)
Potassium: 4.1 mmol/L (ref 3.5–5.2)
Sodium: 137 mmol/L (ref 134–144)
Total Protein: 6.8 g/dL (ref 6.0–8.5)
eGFR: 89 mL/min/{1.73_m2} (ref >59)

## 2022-09-06 LAB — LIPID PANEL
Cholesterol, Total: 156 mg/dL (ref 100–199)
HDL: 29 mg/dL — ABNORMAL LOW (ref >39)
Triglycerides: 237 mg/dL — ABNORMAL HIGH (ref 0–149)
VLDL Cholesterol Cal: 40 mg/dL (ref 5–40)

## 2022-09-06 LAB — PSA, TOTAL AND FREE
PSA, Free Pct: 16.9 %
Prostate Specific Ag, Serum: 1.3 ng/mL (ref 0.0–4.0)

## 2022-12-12 ENCOUNTER — Ambulatory Visit: Payer: BC Managed Care – PPO | Admitting: Nurse Practitioner

## 2022-12-12 ENCOUNTER — Encounter: Payer: Self-pay | Admitting: Nurse Practitioner

## 2022-12-12 VITALS — BP 140/80 | HR 71 | Temp 97.9°F | Resp 20 | Ht 70.0 in | Wt 252.0 lb

## 2022-12-12 DIAGNOSIS — Z720 Tobacco use: Secondary | ICD-10-CM

## 2022-12-12 DIAGNOSIS — F3342 Major depressive disorder, recurrent, in full remission: Secondary | ICD-10-CM

## 2022-12-12 DIAGNOSIS — E1169 Type 2 diabetes mellitus with other specified complication: Secondary | ICD-10-CM

## 2022-12-12 DIAGNOSIS — E785 Hyperlipidemia, unspecified: Secondary | ICD-10-CM

## 2022-12-12 DIAGNOSIS — I10 Essential (primary) hypertension: Secondary | ICD-10-CM

## 2022-12-12 DIAGNOSIS — F419 Anxiety disorder, unspecified: Secondary | ICD-10-CM

## 2022-12-12 DIAGNOSIS — Z7984 Long term (current) use of oral hypoglycemic drugs: Secondary | ICD-10-CM

## 2022-12-12 DIAGNOSIS — K219 Gastro-esophageal reflux disease without esophagitis: Secondary | ICD-10-CM

## 2022-12-12 DIAGNOSIS — G4733 Obstructive sleep apnea (adult) (pediatric): Secondary | ICD-10-CM

## 2022-12-12 DIAGNOSIS — E349 Endocrine disorder, unspecified: Secondary | ICD-10-CM

## 2022-12-12 DIAGNOSIS — E119 Type 2 diabetes mellitus without complications: Secondary | ICD-10-CM | POA: Diagnosis not present

## 2022-12-12 LAB — LIPID PANEL

## 2022-12-12 LAB — BAYER DCA HB A1C WAIVED: HB A1C (BAYER DCA - WAIVED): 8.9 % — ABNORMAL HIGH (ref 4.8–5.6)

## 2022-12-12 MED ORDER — TADALAFIL 20 MG PO TABS: 20.0000 mg | ORAL_TABLET | Freq: Every day | ORAL | 2 refills | Status: DC | PRN

## 2022-12-12 MED ORDER — SITAGLIPTIN PHOSPHATE 100 MG PO TABS
100.0000 mg | ORAL_TABLET | Freq: Every day | ORAL | 0 refills | Status: DC
Start: 2022-12-12 — End: 2023-03-13

## 2022-12-12 MED ORDER — METFORMIN HCL 1000 MG PO TABS: ORAL_TABLET | ORAL | 1 refills | Status: DC

## 2022-12-12 MED ORDER — LOSARTAN POTASSIUM 100 MG PO TABS
ORAL_TABLET | ORAL | 1 refills | Status: DC
Start: 2022-12-12 — End: 2023-11-27

## 2022-12-12 NOTE — Progress Notes (Signed)
Subjective:    Patient ID: Clayton [name], male    DOB: [birth year], 62 y.o.   MRN: [id number]   Chief Complaint: medical management of chronic issues     HPI:  Clayton [name] is a 62 y.o. who identifies as a male who was assigned male at birth.   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 Essential hypertension, benign No c/o chest pain, sob or headache. Does not check blood pressure at home. BP Readings from Last 3 Encounters:  09/05/22 133/76  05/30/22 (!) 147/86  02/21/22 135/77     2. Hyperlipidemia associated with type 2 diabetes mellitus (HCC) Does not really watch diet and does no dedicated exercise. Lab Results  Component Value Date   CHOL 156 09/05/2022   HDL 29 (L) 09/05/2022   LDLCALC 87 09/05/2022   LDLDIRECT 70 09/24/2014   TRIG 237 (H) 09/05/2022   CHOLHDL 5.4 (H) 09/05/2022      3. Diabetes mellitus treated with oral medication (HCC) Doe snot really check blood sugars every day. Encouraged a stricter low carb diet with possible medication changes at next visit if no improvement. Lab Results  Component Value Date   HGBA1C 8.6 (H) 09/05/2022     4. Gastroesophageal reflux disease without esophagitis Is on protonix daily and is doing well.  5. Testosterone deficiency Is currently on testosterone Lab Results  Component Value Date   TESTOSTERONE 351 01/11/2021     6. Anxiety    12/12/2022    8:40 AM 09/05/2022   10:30 AM 05/30/2022    9:19 AM 02/21/2022    8:31 AM  GAD 7 : Generalized Anxiety Score  Nervous, Anxious, on Edge 0 0 0 0  Control/stop worrying 0 0 0 0  Worry too much - different things 0 0 0 0  Trouble relaxing 1 2 1 1   Restless 1 2 1 1   Easily annoyed or irritable 0 0 0 0  Afraid - awful might happen 0 0 0 0  Total GAD 7 Score 2 4 2 2   Anxiety Difficulty Somewhat difficult Somewhat difficult Somewhat difficult  Not difficult at all      7. Recurrent major depressive disorder, in full remission (HCC) Is on lexapro and is doing well    12/12/2022    8:39 AM 09/05/2022   10:29 AM 05/30/2022    9:19 AM  Depression screen PHQ 2/9  Decreased Interest 0 0 0  Down, Depressed, Hopeless 0 0 0  PHQ - 2 Score 0 0 0  Altered sleeping 1 2 0  Tired, decreased energy 0 0 0  Change in appetite 1 0 0  Feeling bad or failure about yourself  0 0 0  Trouble concentrating 0 0 0  Moving slowly or fidgety/restless 0 0 0  Suicidal thoughts 0 0 0  PHQ-9 Score 2 2 0  Difficult doing work/chores Somewhat difficult Not difficult at all Not difficult at all     8. OSA (obstructive sleep apnea) Wears cpap machine nightly  9. Tobacco use Smokes at  least 1/2 -1 pack a day. Has not had a  low dose CT scan.  10. Morbid obesity (HCC) No recent weight changes Wt Readings from Last 3 Encounters:  12/12/22 252 lb (114.3 kg)  09/05/22 252 lb (114.3 kg)  05/30/22 254 lb (115.2 kg)   BMI Readings from Last 3 Encounters:  12/12/22 36.16 kg/m  09/05/22 36.16 kg/m  05/30/22 36.45 kg/m      New complaints: None today  No Known Allergies Outpatient Encounter Medications as of 12/12/2022  Medication Sig   aspirin 81 MG EC tablet Take 1 tablet (81 mg total) by mouth daily. Swallow whole.   escitalopram (LEXAPRO) 10 MG tablet TAKE 1 TABLET BY MOUTH EVERY DAY   fenofibrate (TRICOR) 145 MG tablet Take 1 tablet (145 mg total) by mouth daily.   glimepiride (AMARYL) 4 MG tablet Take 1 tablet (4 mg total) by mouth daily before breakfast.   glucose blood (ONETOUCH VERIO) test strip Use to check BG 1 to 2 times daily Dx: E11.9   hydrochlorothiazide (HYDRODIURIL) 25 MG tablet Take 1 tablet (25 mg total) by mouth daily.   losartan (COZAAR) 100 MG tablet TAKE 1 TABLET BY MOUTH EVERY DAY   metFORMIN (GLUCOPHAGE) 1000 MG tablet TAKE 1 TABLET BY MOUTH TWICE A DAY WITH FOOD   nitroGLYCERIN (NITROSTAT) 0.4 MG SL tablet Place  1 tablet (0.4 mg total) under the tongue every 5 (five) minutes as needed for chest pain.   OneTouch Delica Lancets 33G MISC Use to check BG 1 to 2 times daily.  Dx:  E11.9   pantoprazole (PROTONIX) 40 MG tablet Take 1 tablet (40 mg total) by mouth daily.   rosuvastatin (CRESTOR) 10 MG tablet Take 1 tablet (10 mg total) by mouth daily.   No facility-administered encounter medications on file as of 12/12/2022.    Past Surgical History:  Procedure Laterality Date   ANTERIOR CRUCIATE LIGAMENT REPAIR Left 2012   Knee   COLONOSCOPY     POLYPECTOMY      Family History  Problem Relation Age of Onset   Hypertension Father    Diabetes Father    Heart attack Mother    Breast cancer Mother    Diabetes Paternal Grandfather    Colon cancer Neg Hx    Esophageal cancer Neg Hx    Rectal cancer Neg Hx    Stomach cancer Neg Hx    Prostate cancer Neg Hx    Pancreatic cancer Neg Hx       Controlled substance contract: n/a     Review of Systems  Constitutional:  Negative for diaphoresis.  Eyes:  Negative for pain.  Respiratory:  Negative for shortness of breath.   Cardiovascular:  Negative for chest pain, palpitations and leg swelling.  Gastrointestinal:  Negative for abdominal pain.  Endocrine: Negative for polydipsia.  Skin:  Negative for rash.  Neurological:  Negative for dizziness, weakness and headaches.  Hematological:  Does not bruise/bleed easily.  All other systems reviewed and are negative.      Objective:   Physical Exam Vitals and nursing note reviewed.  Constitutional:      Appearance: Normal appearance. He is well-developed.  HENT:     Head: Normocephalic.     Nose: Nose normal.     Mouth/Throat:     Mouth: Mucous membranes are moist.     Pharynx: Oropharynx is clear.  Eyes:     Pupils: Pupils are equal, round, and reactive to light.  Neck:     Thyroid: No thyroid mass or thyromegaly.     Vascular: No carotid bruit or JVD.     Trachea: Phonation normal.   Cardiovascular:     Rate and Rhythm: Normal rate and regular rhythm.  Pulmonary:     Effort: Pulmonary effort is normal. No respiratory distress.     Breath  sounds: Normal breath sounds.  Abdominal:     General: Bowel sounds are normal.     Palpations: Abdomen is soft.     Tenderness: There is no abdominal tenderness.  Musculoskeletal:        General: Normal range of motion.     Cervical back: Normal range of motion and neck supple.  Lymphadenopathy:     Cervical: No cervical adenopathy.  Skin:    General: Skin is warm and dry.  Neurological:     Mental Status: He is alert and oriented to person, place, and time.  Psychiatric:        Behavior: Behavior normal.        Thought Content: Thought content normal.        Judgment: Judgment normal.     BP (!) 140/80   Pulse 71   Temp 97.9 F (36.6 C) (Temporal)   Resp 20   Ht 5\' 10"  (1.778 m)   Wt 252 lb (114.3 kg)   SpO2 98%   BMI 36.16 kg/m   HGBA1c 8.9%     Assessment & Plan:   JAIMES DELOSANGELES comes in today with chief complaint of Medical Management of Chronic Issues   Diagnosis and orders addressed:  1. Essential hypertension, benign Low sodium diet - CBC with Differential/Platelet - CMP14+EGFR - losartan (COZAAR) 100 MG tablet; TAKE 1 TABLET BY MOUTH EVERY DAY  Dispense: 90 tablet; Refill: 1  2. Hyperlipidemia associated with type 2 diabetes mellitus (HCC) Low fat diet - Lipid panel  3. Diabetes mellitus treated with oral medication (HCC) Added januvia to meds Please keep diary of blood pressure - Bayer DCA Hb A1c Waived - sitaGLIPtin (JANUVIA) 100 MG tablet; Take 1 tablet (100 mg total) by mouth daily.  Dispense: 90 tablet; Refill: 0 - metFORMIN (GLUCOPHAGE) 1000 MG tablet; TAKE 1 TABLET BY MOUTH TWICE A DAY WITH FOOD  Dispense: 180 tablet; Refill: 1  4. Gastroesophageal reflux disease without esophagitis Avoid spicy foods Do not eat 2 hours prior to bedtime   5. Testosterone deficiency -  Testosterone,Free and Total  6. Anxiety 7. Recurrent major depressive disorder, in full remission (HCC) Stress management  8. OSA (obstructive sleep apnea) Wear cpap at night  9. Tobacco use Smoking cessation  10. Morbid obesity (HCC) Discussed diet and exercise for person with BMI >25 Will recheck weight in 3-6 months    Labs pending Health Maintenance reviewed Diet and exercise encouraged  Follow up plan: 3 months   Mary-Margaret Daphine Deutscher, FNP

## 2022-12-13 ENCOUNTER — Other Ambulatory Visit (INDEPENDENT_AMBULATORY_CARE_PROVIDER_SITE_OTHER): Payer: BC Managed Care – PPO | Admitting: Nurse Practitioner

## 2022-12-13 DIAGNOSIS — E119 Type 2 diabetes mellitus without complications: Secondary | ICD-10-CM

## 2022-12-13 DIAGNOSIS — Z7984 Long term (current) use of oral hypoglycemic drugs: Secondary | ICD-10-CM | POA: Diagnosis not present

## 2022-12-13 MED ORDER — GLIMEPIRIDE 4 MG PO TABS: 8.0000 mg | ORAL_TABLET | Freq: Every day | ORAL | 1 refills | Status: DC

## 2022-12-13 MED ORDER — GLIMEPIRIDE 4 MG PO TABS
8.0000 mg | ORAL_TABLET | Freq: Every day | ORAL | 1 refills | Status: DC
Start: 2022-12-13 — End: 2022-12-13

## 2022-12-13 NOTE — Progress Notes (Signed)
Increase amaryl to 8mg  daily  Meds ordered this encounter  Medications   DISCONTD: glimepiride (AMARYL) 4 MG tablet    Sig: Take 2 tablets (8 mg total) by mouth daily before breakfast.    Dispense:  90 tablet    Refill:  1    Order Specific Question:   Supervising Provider    Answer:   Arville Care A [[id number]]   glimepiride (AMARYL) 4 MG tablet    Sig: Take 2 tablets (8 mg total) by mouth daily before breakfast.    Dispense:  180 tablet    Refill:  1    Order Specific Question:   Supervising Provider    Answer:   Shanon Payor   Mary-Margaret Daphine Deutscher, FNP

## 2022-12-15 LAB — CBC WITH DIFFERENTIAL/PLATELET
Basophils Absolute: 0.1 10*3/uL (ref 0.0–0.2)
Basos: 1 %
EOS (ABSOLUTE): 0.4 10*3/uL (ref 0.0–0.4)
Eos: 5 %
Hematocrit: 42.3 % (ref 37.5–51.0)
Hemoglobin: 13.5 g/dL (ref 13.0–17.7)
Immature Grans (Abs): 0 10*3/uL (ref 0.0–0.1)
Immature Granulocytes: 0 %
Lymphocytes Absolute: 2.5 10*3/uL (ref 0.7–3.1)
Lymphs: 27 %
MCH: 30.4 pg (ref 26.6–33.0)
MCHC: 31.9 g/dL (ref 31.5–35.7)
MCV: 95 fL (ref 79–97)
Monocytes Absolute: 0.8 10*3/uL (ref 0.1–0.9)
Monocytes: 9 %
Neutrophils Absolute: 5.4 10*3/uL (ref 1.4–7.0)
Neutrophils: 58 %
Platelets: 249 10*3/uL (ref 150–450)
RBC: 4.44 x10E6/uL (ref 4.14–5.80)
RDW: 12.4 % (ref 11.6–15.4)
WBC: 9.2 10*3/uL (ref 3.4–10.8)

## 2022-12-15 LAB — CMP14+EGFR
ALT: 17 IU/L (ref 0–44)
AST: 17 IU/L (ref 0–40)
Albumin: 4.6 g/dL (ref 3.8–4.9)
Alkaline Phosphatase: 75 IU/L (ref 44–121)
BUN/Creatinine Ratio: 27 — ABNORMAL HIGH (ref 10–24)
BUN: 22 mg/dL (ref 8–27)
Bilirubin Total: 0.7 mg/dL (ref 0.0–1.2)
CO2: 22 mmol/L (ref 20–29)
Calcium: 9.6 mg/dL (ref 8.6–10.2)
Chloride: 98 mmol/L (ref 96–106)
Creatinine, Ser: 0.81 mg/dL (ref 0.76–1.27)
Globulin, Total: 2.5 g/dL (ref 1.5–4.5)
Glucose: 211 mg/dL — ABNORMAL HIGH (ref 70–99)
Potassium: 4.1 mmol/L (ref 3.5–5.2)
Sodium: 138 mmol/L (ref 134–144)
Total Protein: 7.1 g/dL (ref 6.0–8.5)
eGFR: 101 mL/min/{1.73_m2} (ref >59)

## 2022-12-15 LAB — LIPID PANEL
Cholesterol, Total: 138 mg/dL (ref 100–199)
HDL: 27 mg/dL — ABNORMAL LOW (ref >39)
LDL CALC COMMENT:: 5.1 ratio — ABNORMAL HIGH (ref 0.0–5.0)
LDL Chol Calc (NIH): 75 mg/dL (ref 0–99)
Triglycerides: 218 mg/dL — ABNORMAL HIGH (ref 0–149)
VLDL Cholesterol Cal: 36 mg/dL (ref 5–40)

## 2022-12-15 LAB — TESTOSTERONE,FREE AND TOTAL
Testosterone, Free: 7.5 pg/mL (ref 6.6–18.1)
Testosterone: 266 ng/dL (ref 264–916)

## 2023-03-09 ENCOUNTER — Ambulatory Visit: Payer: BC Managed Care – PPO | Admitting: Nurse Practitioner

## 2023-03-13 ENCOUNTER — Encounter: Payer: Self-pay | Admitting: Nurse Practitioner

## 2023-03-13 ENCOUNTER — Ambulatory Visit: Payer: BC Managed Care – PPO | Admitting: Nurse Practitioner

## 2023-03-13 VITALS — BP 165/75 | HR 74 | Temp 98.2°F | Ht 70.0 in | Wt 255.0 lb

## 2023-03-13 DIAGNOSIS — I10 Essential (primary) hypertension: Secondary | ICD-10-CM | POA: Diagnosis not present

## 2023-03-13 DIAGNOSIS — K219 Gastro-esophageal reflux disease without esophagitis: Secondary | ICD-10-CM | POA: Diagnosis not present

## 2023-03-13 DIAGNOSIS — E119 Type 2 diabetes mellitus without complications: Secondary | ICD-10-CM | POA: Diagnosis not present

## 2023-03-13 DIAGNOSIS — E785 Hyperlipidemia, unspecified: Secondary | ICD-10-CM

## 2023-03-13 DIAGNOSIS — G4733 Obstructive sleep apnea (adult) (pediatric): Secondary | ICD-10-CM

## 2023-03-13 DIAGNOSIS — E1169 Type 2 diabetes mellitus with other specified complication: Secondary | ICD-10-CM

## 2023-03-13 DIAGNOSIS — Z720 Tobacco use: Secondary | ICD-10-CM

## 2023-03-13 DIAGNOSIS — F419 Anxiety disorder, unspecified: Secondary | ICD-10-CM

## 2023-03-13 DIAGNOSIS — Z7984 Long term (current) use of oral hypoglycemic drugs: Secondary | ICD-10-CM

## 2023-03-13 DIAGNOSIS — E349 Endocrine disorder, unspecified: Secondary | ICD-10-CM

## 2023-03-13 DIAGNOSIS — F3342 Major depressive disorder, recurrent, in full remission: Secondary | ICD-10-CM

## 2023-03-13 LAB — BAYER DCA HB A1C WAIVED: HB A1C (BAYER DCA - WAIVED): 8.6 % — ABNORMAL HIGH (ref 4.8–5.6)

## 2023-03-13 LAB — LIPID PANEL

## 2023-03-13 MED ORDER — GLIMEPIRIDE 4 MG PO TABS: 8.0000 mg | ORAL_TABLET | Freq: Every day | ORAL | 0 refills | Status: DC

## 2023-03-13 MED ORDER — GLIMEPIRIDE 4 MG PO TABS
8.0000 mg | ORAL_TABLET | Freq: Every day | ORAL | 1 refills | Status: DC
Start: 2023-03-13 — End: 2023-03-13

## 2023-03-13 MED ORDER — PANTOPRAZOLE SODIUM 40 MG PO TBEC
40.0000 mg | DELAYED_RELEASE_TABLET | Freq: Every day | ORAL | 1 refills | Status: DC
Start: 2023-03-13 — End: 2023-04-28

## 2023-03-13 MED ORDER — GLIMEPIRIDE 4 MG PO TABS: 8.0000 mg | ORAL_TABLET | Freq: Every day | ORAL | 1 refills | Status: DC

## 2023-03-13 MED ORDER — ROSUVASTATIN CALCIUM 10 MG PO TABS
10.0000 mg | ORAL_TABLET | Freq: Every day | ORAL | 1 refills | Status: DC
Start: 2023-03-13 — End: 2023-11-27

## 2023-03-13 MED ORDER — FENOFIBRATE 145 MG PO TABS: 145.0000 mg | ORAL_TABLET | Freq: Every day | ORAL | 1 refills | Status: DC

## 2023-03-13 MED ORDER — ESCITALOPRAM OXALATE 10 MG PO TABS
ORAL_TABLET | ORAL | 1 refills | Status: DC
Start: 2023-03-13 — End: 2023-11-27

## 2023-03-13 NOTE — Progress Notes (Signed)
Subjective:    Patient ID: Clayton [name], male    DOB: [birth year], 62 y.o.   MRN: [id number]   Chief Complaint: medical management of chronic issues     HPI:  Clayton [name] is a 62 y.o. who identifies as a male who was assigned male at birth.   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 Essential hypertension, benign No c/o chest pain, sob or headache. Does not check blood pressure at home.  BP Readings from Last 3 Encounters:  03/13/23 (!) 165/75  12/12/22 (!) 140/80  09/05/22 133/76     2. Hyperlipidemia associated with type 2 diabetes mellitus (HCC) Does not really watch diet and does no dedicated exercise. Lab Results  Component Value Date   CHOL 138 12/12/2022   HDL 27 (L) 12/12/2022   LDLCALC 75 12/12/2022   LDLDIRECT 70 09/24/2014   TRIG 218 (H) 12/12/2022   CHOLHDL 5.1 (H) 12/12/2022      3. Diabetes mellitus treated with oral medication (HCC) Does not really check blood sugars every day. Encouraged a stricter low carb diet with possible medication changes at next visit if no improvement.. we added januvia to meds at last visit. He did not start  Januvia because was to expensive. So he doubled up on amaryl. Lab Results  Component Value Date   HGBA1C 8.9 (H) 12/12/2022     4. Gastroesophageal reflux disease without esophagitis Is on protonix daily and is doing well.  5. Testosterone deficiency Is currently on testosterone Lab Results  Component Value Date   TESTOSTERONE 266 12/12/2022     6. Anxiety    12/12/2022    8:40 AM 09/05/2022   10:30 AM 05/30/2022    9:19 AM 02/21/2022    8:31 AM  GAD 7 : Generalized Anxiety Score  Nervous, Anxious, on Edge 0 0 0 0  Control/stop worrying 0 0 0 0  Worry too much - different things 0 0 0 0  Trouble relaxing 1 2 1 1   Restless 1 2 1 1   Easily annoyed or irritable 0 0 0 0  Afraid - awful  might happen 0 0 0 0  Total GAD 7 Score 2 4 2 2   Anxiety Difficulty Somewhat difficult Somewhat difficult Somewhat difficult Not difficult at all        7. Recurrent major depressive disorder, in full remission (HCC) Is on lexapro and is doing well    03/13/2023    8:16 AM 12/12/2022    8:39 AM 09/05/2022   10:29 AM  Depression screen PHQ 2/9  Decreased Interest 0 0 0  Down, Depressed, Hopeless 0 0 0  PHQ - 2 Score 0 0 0  Altered sleeping  1 2  Tired, decreased energy  0 0  Change in appetite  1 0  Feeling bad or failure about yourself   0 0  Trouble concentrating  0 0  Moving slowly or fidgety/restless  0 0  Suicidal thoughts  0 0  PHQ-9 Score  2 2  Difficult doing work/chores  Somewhat difficult Not difficult at all      8. OSA (obstructive sleep apnea) Wears cpap machine nightly  9. Tobacco use Smokes at  least 1/2 -1 pack a day. Has not had a  low dose CT scan.  10. Morbid obesity (HCC) No recent weight changes Wt Readings from Last 3 Encounters:  03/13/23 255 lb (115.7 kg)  12/12/22 252 lb (114.3 kg)  09/05/22 252 lb (114.3 kg)    BMI Readings from Last 3 Encounters:  03/13/23 36.59 kg/m  12/12/22 36.16 kg/m  09/05/22 36.16 kg/m       New complaints: None today  No Known Allergies Outpatient Encounter Medications as of 03/13/2023  Medication Sig   aspirin 81 MG EC tablet Take 1 tablet (81 mg total) by mouth daily. Swallow whole.   escitalopram (LEXAPRO) 10 MG tablet TAKE 1 TABLET BY MOUTH EVERY DAY   fenofibrate (TRICOR) 145 MG tablet Take 1 tablet (145 mg total) by mouth daily.   glimepiride (AMARYL) 4 MG tablet Take 2 tablets (8 mg total) by mouth daily before breakfast.   glucose blood (ONETOUCH VERIO) test strip Use to check BG 1 to 2 times daily Dx: E11.9   hydrochlorothiazide (HYDRODIURIL) 25 MG tablet Take 1 tablet (25 mg total) by mouth daily.   losartan (COZAAR) 100 MG tablet TAKE 1 TABLET BY MOUTH EVERY DAY   metFORMIN (GLUCOPHAGE)  1000 MG tablet TAKE 1 TABLET BY MOUTH TWICE A DAY WITH FOOD   nitroGLYCERIN (NITROSTAT) 0.4 MG SL tablet Place 1 tablet (0.4 mg total) under the tongue every 5 (five) minutes as needed for chest pain.   OneTouch Delica Lancets 33G MISC Use to check BG 1 to 2 times daily.  Dx:  E11.9   pantoprazole (PROTONIX) 40 MG tablet Take 1 tablet (40 mg total) by mouth daily.   rosuvastatin (CRESTOR) 10 MG tablet Take 1 tablet (10 mg total) by mouth daily.   sitaGLIPtin (JANUVIA) 100 MG tablet Take 1 tablet (100 mg total) by mouth daily.   tadalafil (CIALIS) 20 MG tablet Take 1 tablet (20 mg total) by mouth daily as needed for erectile dysfunction.   No facility-administered encounter medications on file as of 03/13/2023.    Past Surgical History:  Procedure Laterality Date   ANTERIOR CRUCIATE LIGAMENT REPAIR Left 2012   Knee   COLONOSCOPY     POLYPECTOMY      Family History  Problem Relation Age of Onset   Hypertension Father    Diabetes Father    Heart attack Mother    Breast cancer Mother    Diabetes Paternal Grandfather    Colon cancer Neg Hx    Esophageal cancer Neg Hx    Rectal cancer Neg Hx    Stomach cancer Neg Hx    Prostate cancer Neg Hx    Pancreatic cancer Neg Hx       Controlled substance contract: n/a     Review of Systems  Constitutional:  Negative for diaphoresis.  Eyes:  Negative for pain.  Respiratory:  Negative for shortness of breath.   Cardiovascular:  Negative for chest pain, palpitations and leg swelling.  Gastrointestinal:  Negative for abdominal pain.  Endocrine: Negative for polydipsia.  Skin:  Negative for rash.  Neurological:  Negative for dizziness, weakness and headaches.  Hematological:  Does not bruise/bleed easily.  All other systems reviewed and are negative.      Objective:   Physical Exam Vitals and nursing note reviewed.  Constitutional:      Appearance: Normal appearance. He is well-developed.  HENT:     Head: Normocephalic.      Nose: Nose normal.     Mouth/Throat:     Mouth: Mucous membranes are moist.     Pharynx: Oropharynx is clear.  Eyes:     Pupils: Pupils are equal, round, and  reactive to light.  Neck:     Thyroid: No thyroid mass or thyromegaly.     Vascular: No carotid bruit or JVD.     Trachea: Phonation normal.  Cardiovascular:     Rate and Rhythm: Normal rate and regular rhythm.  Pulmonary:     Effort: Pulmonary effort is normal. No respiratory distress.     Breath sounds: Normal breath sounds.  Abdominal:     General: Bowel sounds are normal.     Palpations: Abdomen is soft.     Tenderness: There is no abdominal tenderness.  Musculoskeletal:        General: Normal range of motion.     Cervical back: Normal range of motion and neck supple.  Lymphadenopathy:     Cervical: No cervical adenopathy.  Skin:    General: Skin is warm and dry.  Neurological:     Mental Status: He is alert and oriented to person, place, and time.  Psychiatric:        Behavior: Behavior normal.        Thought Content: Thought content normal.        Judgment: Judgment normal.     There were no vitals taken for this visit.  HGBA1c 8.6%     Assessment & Plan:   RAYDEN KOHRING comes in today with chief complaint of No chief complaint on file.   Diagnosis and orders addressed:  1. Essential hypertension, benign Low sodium diet - CBC with Differential/Platelet - CMP14+EGFR - losartan (COZAAR) 100 MG tablet; TAKE 1 TABLET BY MOUTH EVERY DAY  Dispense: 90 tablet; Refill: 1  2. Hyperlipidemia associated with type 2 diabetes mellitus (HCC) Low fat diet - Lipid panel  3. Diabetes mellitus treated with oral medication (HCC) Ry[place]us sample3mg  1po daily Please keep diary of blood pressure - Bayer DCA Hb A1c Waived - sitaGLIPtin (JANUVIA) 100 MG tablet; Take 1 tablet (100 mg total) by mouth daily.  Dispense: 90 tablet; Refill: 0 - metFORMIN (GLUCOPHAGE) 1000 MG tablet; TAKE 1 TABLET BY MOUTH TWICE A DAY  WITH FOOD  Dispense: 180 tablet; Refill: 1  4. Gastroesophageal reflux disease without esophagitis Avoid spicy foods Do not eat 2 hours prior to bedtime   5. Testosterone deficiency - Testosterone,Free and Total  6. Anxiety 7. Recurrent major depressive disorder, in full remission (HCC) Stress management  8. OSA (obstructive sleep apnea) Wear cpap at night  9. Tobacco use Smoking cessation  10. Morbid obesity (HCC) Discussed diet and exercise for person with BMI >25 Will recheck weight in 3-6 months    Labs pending Health Maintenance reviewed Diet and exercise encouraged  Follow up plan: 3 months   Mary-Margaret Daphine Deutscher, FNP

## 2023-03-13 NOTE — Addendum Note (Signed)
Addended by: Dorene Sorrow on: 03/13/2023 11:18 AM   Modules accepted: Orders

## 2023-03-13 NOTE — Patient Instructions (Signed)

## 2023-03-14 LAB — CMP14+EGFR
ALT: 15 IU/L (ref 0–44)
AST: 14 IU/L (ref 0–40)
Albumin: 4.4 g/dL (ref 3.9–4.9)
Alkaline Phosphatase: 79 IU/L (ref 44–121)
BUN/Creatinine Ratio: 19 (ref 10–24)
BUN: 18 mg/dL (ref 8–27)
Bilirubin Total: 0.6 mg/dL (ref 0.0–1.2)
CO2: 24 mmol/L (ref 20–29)
Calcium: 9.9 mg/dL (ref 8.6–10.2)
Chloride: 96 mmol/L (ref 96–106)
Creatinine, Ser: 0.93 mg/dL (ref 0.76–1.27)
Globulin, Total: 2.6 g/dL (ref 1.5–4.5)
Glucose: 192 mg/dL — ABNORMAL HIGH (ref 70–99)
Potassium: 4.8 mmol/L (ref 3.5–5.2)
Sodium: 137 mmol/L (ref 134–144)
Total Protein: 7 g/dL (ref 6.0–8.5)
eGFR: 93 mL/min/{1.73_m2} (ref >59)

## 2023-03-14 LAB — LIPID PANEL
Cholesterol, Total: 148 mg/dL (ref 100–199)
HDL: 28 mg/dL — ABNORMAL LOW (ref >39)
LDL CALC COMMENT:: 5.3 ratio — ABNORMAL HIGH (ref 0.0–5.0)
LDL Chol Calc (NIH): 83 mg/dL (ref 0–99)
Triglycerides: 220 mg/dL — ABNORMAL HIGH (ref 0–149)
VLDL Cholesterol Cal: 37 mg/dL (ref 5–40)

## 2023-03-14 LAB — CBC WITH DIFFERENTIAL/PLATELET
Basophils Absolute: 0.1 10*3/uL (ref 0.0–0.2)
Basos: 1 %
EOS (ABSOLUTE): 0.4 10*3/uL (ref 0.0–0.4)
Eos: 3 %
Hematocrit: 41.7 % (ref 37.5–51.0)
Hemoglobin: 14 g/dL (ref 13.0–17.7)
Immature Grans (Abs): 0 10*3/uL (ref 0.0–0.1)
Immature Granulocytes: 0 %
Lymphocytes Absolute: 2.8 10*3/uL (ref 0.7–3.1)
Lymphs: 25 %
MCH: 31.6 pg (ref 26.6–33.0)
MCHC: 33.6 g/dL (ref 31.5–35.7)
MCV: 94 fL (ref 79–97)
Monocytes Absolute: 0.9 10*3/uL (ref 0.1–0.9)
Monocytes: 8 %
Neutrophils Absolute: 7 10*3/uL (ref 1.4–7.0)
Neutrophils: 63 %
Platelets: 251 10*3/uL (ref 150–450)
RBC: 4.43 x10E6/uL (ref 4.14–5.80)
RDW: 12.3 % (ref 11.6–15.4)
WBC: 11.2 10*3/uL — ABNORMAL HIGH (ref 3.4–10.8)

## 2023-04-28 ENCOUNTER — Other Ambulatory Visit: Payer: Self-pay | Admitting: Nurse Practitioner

## 2023-04-28 DIAGNOSIS — K219 Gastro-esophageal reflux disease without esophagitis: Secondary | ICD-10-CM

## 2023-05-29 ENCOUNTER — Ambulatory Visit: Payer: BC Managed Care – PPO | Admitting: Nurse Practitioner

## 2023-06-05 ENCOUNTER — Ambulatory Visit: Admitting: Nurse Practitioner

## 2023-06-05 NOTE — Progress Notes (Deleted)
 Subjective:    Patient ID: Clayton [name], male    DOB: 1960-06-29, 63 y.o.   MRN: [id number]   Chief Complaint: medical management of chronic issues     HPI:  Clayton [name] is a 63 y.o. who identifies as a male who was assigned male at birth.   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 Essential hypertension, benign No c/o chest pain, sob or headache. Does not check blood pressure at home.  BP Readings from Last 3 Encounters:  03/13/23 (!) 165/75  12/12/22 (!) 140/80  09/05/22 133/76     2. Hyperlipidemia associated with type 2 diabetes mellitus (HCC) Does not really watch diet and does no dedicated exercise. Lab Results  Component Value Date   CHOL 148 03/13/2023   HDL 28 (L) 03/13/2023   LDLCALC 83 03/13/2023   LDLDIRECT 70 09/24/2014   TRIG 220 (H) 03/13/2023   CHOLHDL 5.3 (H) 03/13/2023      3. Diabetes mellitus treated with oral medication (HCC) Does not really check blood sugars every day. Encouraged a stricter low carb diet with possible medication changes at next visit if no improvement.. we added januvia to meds at last visit. He did not start  Januvia because was to expensive. So he doubled up on amaryl. Lab Results  Component Value Date   HGBA1C 8.6 (H) 03/13/2023     4. Gastroesophageal reflux disease without esophagitis Is on protonix daily and is doing well.  5. Testosterone deficiency Is currently on testosterone Lab Results  Component Value Date   TESTOSTERONE 266 12/12/2022     6. Anxiety ***      7. Recurrent major depressive disorder, in full remission (HCC) Is on lexapro and is doing well ***    8. OSA (obstructive sleep apnea) Wears cpap machine nightly  9. Tobacco use Smokes at  least 1/2 -1 pack a day. Has not had a  low dose CT scan.  10. Morbid obesity (HCC) No recent weight  changes ***       New complaints: None today  No Known Allergies Outpatient Encounter Medications as of 06/05/2023  Medication Sig   aspirin 81 MG EC tablet Take 1 tablet (81 mg total) by mouth daily. Swallow whole.   escitalopram (LEXAPRO) 10 MG tablet TAKE 1 TABLET BY MOUTH EVERY DAY   fenofibrate (TRICOR) 145 MG tablet Take 1 tablet (145 mg total) by mouth daily.   glimepiride (AMARYL) 4 MG tablet Take 2 tablets (8 mg total) by mouth daily before breakfast.   glucose blood (ONETOUCH VERIO) test strip Use to check BG 1 to 2 times daily Dx: E11.9   hydrochlorothiazide (HYDRODIURIL) 25 MG tablet Take 1 tablet (25 mg total) by mouth daily.   losartan (COZAAR) 100 MG tablet TAKE 1 TABLET BY MOUTH EVERY DAY   metFORMIN (GLUCOPHAGE) 1000 MG tablet TAKE 1 TABLET BY MOUTH TWICE A DAY WITH FOOD   nitroGLYCERIN (NITROSTAT) 0.4 MG SL tablet Place 1 tablet (0.4 mg total) under the tongue every 5 (five) minutes as needed for chest pain.   OneTouch Delica Lancets 33G MISC Use to check BG 1 to 2 times daily.  Dx:  E11.9   pantoprazole (PROTONIX) 40 MG tablet TAKE 1 TABLET BY MOUTH EVERY DAY   rosuvastatin (CRESTOR) 10 MG tablet Take 1 tablet (10 mg total) by mouth daily.   tadalafil (CIALIS)  20 MG tablet Take 1 tablet (20 mg total) by mouth daily as needed for erectile dysfunction.   No facility-administered encounter medications on file as of 06/05/2023.    Past Surgical History:  Procedure Laterality Date   ANTERIOR CRUCIATE LIGAMENT REPAIR Left 2012   Knee   COLONOSCOPY     POLYPECTOMY      Family History  Problem Relation Age of Onset   Hypertension Father    Diabetes Father    Heart attack Mother    Breast cancer Mother    Diabetes Paternal Grandfather    Colon cancer Neg Hx    Esophageal cancer Neg Hx    Rectal cancer Neg Hx    Stomach cancer Neg Hx    Prostate cancer Neg Hx    Pancreatic cancer Neg Hx       Controlled substance contract: n/a     Review of Systems   Constitutional:  Negative for diaphoresis.  Eyes:  Negative for pain.  Respiratory:  Negative for shortness of breath.   Cardiovascular:  Negative for chest pain, palpitations and leg swelling.  Gastrointestinal:  Negative for abdominal pain.  Endocrine: Negative for polydipsia.  Skin:  Negative for rash.  Neurological:  Negative for dizziness, weakness and headaches.  Hematological:  Does not bruise/bleed easily.  All other systems reviewed and are negative.      Objective:   Physical Exam Vitals and nursing note reviewed.  Constitutional:      Appearance: Normal appearance. He is well-developed.  HENT:     Head: Normocephalic.     Nose: Nose normal.     Mouth/Throat:     Mouth: Mucous membranes are moist.     Pharynx: Oropharynx is clear.  Eyes:     Pupils: Pupils are equal, round, and reactive to light.  Neck:     Thyroid: No thyroid mass or thyromegaly.     Vascular: No carotid bruit or JVD.     Trachea: Phonation normal.  Cardiovascular:     Rate and Rhythm: Normal rate and regular rhythm.  Pulmonary:     Effort: Pulmonary effort is normal. No respiratory distress.     Breath sounds: Normal breath sounds.  Abdominal:     General: Bowel sounds are normal.     Palpations: Abdomen is soft.     Tenderness: There is no abdominal tenderness.  Musculoskeletal:        General: Normal range of motion.     Cervical back: Normal range of motion and neck supple.  Lymphadenopathy:     Cervical: No cervical adenopathy.  Skin:    General: Skin is warm and dry.  Neurological:     Mental Status: He is alert and oriented to person, place, and time.  Psychiatric:        Behavior: Behavior normal.        Thought Content: Thought content normal.        Judgment: Judgment normal.     There were no vitals taken for this visit.  HGBA1c 8.6%     Assessment & Plan:   [name] comes in today with chief complaint of No chief complaint on file.   Diagnosis and  orders addressed:  1. Essential hypertension, benign Low sodium diet - CBC with Differential/Platelet - CMP14+EGFR - losartan (COZAAR) 100 MG tablet; TAKE 1 TABLET BY MOUTH EVERY DAY  Dispense: 90 tablet; Refill: 1  2. Hyperlipidemia associated with type 2 diabetes mellitus (HCC) Low fat diet - Lipid  panel  3. Diabetes mellitus treated with oral medication (HCC) Rybellsus sample3mg  1po daily Please keep diary of blood pressure - Bayer DCA Hb A1c Waived - sitaGLIPtin (JANUVIA) 100 MG tablet; Take 1 tablet (100 mg total) by mouth daily.  Dispense: 90 tablet; Refill: 0 - metFORMIN (GLUCOPHAGE) 1000 MG tablet; TAKE 1 TABLET BY MOUTH TWICE A DAY WITH FOOD  Dispense: 180 tablet; Refill: 1  4. Gastroesophageal reflux disease without esophagitis Avoid spicy foods Do not eat 2 hours prior to bedtime   5. Testosterone deficiency - Testosterone,Free and Total  6. Anxiety 7. Recurrent major depressive disorder, in full remission (HCC) Stress management  8. OSA (obstructive sleep apnea) Wear cpap at night  9. Tobacco use Smoking cessation  10. Morbid obesity (HCC) Discussed diet and exercise for person with BMI >25 Will recheck weight in 3-6 months    Labs pending Health Maintenance reviewed Diet and exercise encouraged  Follow up plan: 3 months   Mary-Margaret Daphine Deutscher, FNP

## 2023-06-06 ENCOUNTER — Encounter: Payer: Self-pay | Admitting: Nurse Practitioner

## 2023-07-10 ENCOUNTER — Encounter: Payer: Self-pay | Admitting: Nurse Practitioner

## 2023-07-10 ENCOUNTER — Ambulatory Visit: Admitting: Nurse Practitioner

## 2023-07-10 ENCOUNTER — Ambulatory Visit (INDEPENDENT_AMBULATORY_CARE_PROVIDER_SITE_OTHER)

## 2023-07-10 VITALS — BP 135/72 | HR 70 | Temp 97.9°F | Ht 70.0 in | Wt 251.0 lb

## 2023-07-10 DIAGNOSIS — N3943 Post-void dribbling: Secondary | ICD-10-CM

## 2023-07-10 DIAGNOSIS — E119 Type 2 diabetes mellitus without complications: Secondary | ICD-10-CM

## 2023-07-10 DIAGNOSIS — K219 Gastro-esophageal reflux disease without esophagitis: Secondary | ICD-10-CM

## 2023-07-10 DIAGNOSIS — I10 Essential (primary) hypertension: Secondary | ICD-10-CM

## 2023-07-10 DIAGNOSIS — Z7984 Long term (current) use of oral hypoglycemic drugs: Secondary | ICD-10-CM

## 2023-07-10 DIAGNOSIS — F419 Anxiety disorder, unspecified: Secondary | ICD-10-CM

## 2023-07-10 DIAGNOSIS — Z720 Tobacco use: Secondary | ICD-10-CM

## 2023-07-10 DIAGNOSIS — Z0001 Encounter for general adult medical examination with abnormal findings: Secondary | ICD-10-CM | POA: Diagnosis not present

## 2023-07-10 DIAGNOSIS — J302 Other seasonal allergic rhinitis: Secondary | ICD-10-CM

## 2023-07-10 DIAGNOSIS — E1169 Type 2 diabetes mellitus with other specified complication: Secondary | ICD-10-CM | POA: Diagnosis not present

## 2023-07-10 DIAGNOSIS — F3342 Major depressive disorder, recurrent, in full remission: Secondary | ICD-10-CM

## 2023-07-10 DIAGNOSIS — E785 Hyperlipidemia, unspecified: Secondary | ICD-10-CM

## 2023-07-10 DIAGNOSIS — E349 Endocrine disorder, unspecified: Secondary | ICD-10-CM

## 2023-07-10 DIAGNOSIS — G4733 Obstructive sleep apnea (adult) (pediatric): Secondary | ICD-10-CM

## 2023-07-10 DIAGNOSIS — Z0000 Encounter for general adult medical examination without abnormal findings: Secondary | ICD-10-CM

## 2023-07-10 LAB — HM DIABETES EYE EXAM

## 2023-07-10 LAB — LIPID PANEL

## 2023-07-10 LAB — BAYER DCA HB A1C WAIVED: HB A1C (BAYER DCA - WAIVED): 8.9 % — ABNORMAL HIGH (ref 4.8–5.6)

## 2023-07-10 MED ORDER — FLUTICASONE PROPIONATE 50 MCG/ACT NA SUSP: 2.0000 | Freq: Every day | NASAL | 6 refills | Status: DC

## 2023-07-10 MED ORDER — TADALAFIL 20 MG PO TABS: 20.0000 mg | ORAL_TABLET | Freq: Every day | ORAL | 6 refills | Status: DC | PRN

## 2023-07-10 MED ORDER — PRAMIPEXOLE DIHYDROCHLORIDE 0.5 MG PO TABS: 0.5000 mg | ORAL_TABLET | Freq: Three times a day (TID) | ORAL | 1 refills | Status: DC

## 2023-07-10 NOTE — Progress Notes (Signed)
 Subjective:    Patient ID: Clayton [name], male    DOB: May 09, 1960, 63 y.o.   MRN: [id number]   Chief Complaint: medical management of chronic issues     HPI:  Clayton [name] is a 63 y.o. who identifies as a male who was assigned male at birth.   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 Essential hypertension, benign No c/o chest pain, sob or headache. Does not check blood pressure at home.  BP Readings from Last 3 Encounters:  07/10/23 135/72  03/13/23 (!) 165/75  12/12/22 (!) 140/80      2. Hyperlipidemia associated with type 2 diabetes mellitus (HCC) Does not really watch diet and does no dedicated exercise. Lab Results  Component Value Date   CHOL 148 03/13/2023   HDL 28 (L) 03/13/2023   LDLCALC 83 03/13/2023   LDLDIRECT 70 09/24/2014   TRIG 220 (H) 03/13/2023   CHOLHDL 5.3 (H) 03/13/2023      3. Diabetes mellitus treated with oral medication (HCC) Does not really check blood sugars every day. Encouraged a stricter low carb diet with possible medication changes at next visit if no improvement..  Lab Results  Component Value Date   HGBA1C 8.6 (H) 03/13/2023        4. Gastroesophageal reflux disease without esophagitis Is on protonix  daily and is doing well.  5. Testosterone  deficiency Is currently on testosterone  Lab Results  Component Value Date   TESTOSTERONE  266 12/12/2022     6. Anxiety     07/10/2023    9:52 AM 12/12/2022    8:40 AM 09/05/2022   10:30 AM 05/30/2022    9:19 AM  GAD 7 : Generalized Anxiety Score  Nervous, Anxious, on Edge 0 0 0 0  Control/stop worrying 0 0 0 0  Worry too much - different things 0 0 0 0  Trouble relaxing 0 1 2 1   Restless 0 1 2 1   Easily annoyed or irritable 0 0 0 0  Afraid - awful might happen 0 0 0 0  Total GAD 7 Score 0 2 4 2   Anxiety Difficulty Not difficult at all Somewhat  difficult Somewhat difficult Somewhat difficult         7. Recurrent major depressive disorder, in full remission (HCC) Is on lexapro  and is doing well     07/10/2023    9:53 AM 07/10/2023    9:52 AM 03/13/2023    8:16 AM  Depression screen PHQ 2/9  Decreased Interest 0 0 0  Down, Depressed, Hopeless 0 0 0  PHQ - 2 Score 0 0 0  Altered sleeping 1    Tired, decreased energy 0    Change in appetite 0    Feeling bad or failure about yourself  0    Trouble concentrating 0    Moving slowly or fidgety/restless 0    Suicidal thoughts 0    PHQ-9 Score 1    Difficult doing work/chores Not difficult at all        8. OSA (obstructive sleep apnea) Wears cpap machine nightly  9. Tobacco use Smokes at  least 1/2 -1 pack a day. Has not had a  low dose CT scan.  10. Morbid obesity (HCC) Weight is down 4lbs   Wt Readings from Last 3 Encounters:  07/10/23 251 lb (113.9 kg)  03/13/23 255 lb (115.7 kg)  12/12/22  252 lb (114.3 kg)   Wt Readings from Last 3 Encounters:  07/10/23 251 lb (113.9 kg)  03/13/23 255 lb (115.7 kg)  12/12/22 252 lb (114.3 kg)        New complaints: -Terrible sinus congestion and sneezing- has been using off brand afrin and not helping. - urinary incontinence- has dribbing urine- trouble with No Known Allergies Outpatient Encounter Medications as of 07/10/2023  Medication Sig   aspirin  81 MG EC tablet Take 1 tablet (81 mg total) by mouth daily. Swallow whole.   escitalopram  (LEXAPRO ) 10 MG tablet TAKE 1 TABLET BY MOUTH EVERY DAY   fenofibrate  (TRICOR ) 145 MG tablet Take 1 tablet (145 mg total) by mouth daily.   glimepiride  (AMARYL ) 4 MG tablet Take 2 tablets (8 mg total) by mouth daily before breakfast.   glucose blood (ONETOUCH VERIO) test strip Use to check BG 1 to 2 times daily Dx: E11.9   hydrochlorothiazide  (HYDRODIURIL ) 25 MG tablet Take 1 tablet (25 mg total) by mouth daily.   losartan  (COZAAR ) 100 MG tablet TAKE 1 TABLET BY MOUTH EVERY DAY    metFORMIN  (GLUCOPHAGE ) 1000 MG tablet TAKE 1 TABLET BY MOUTH TWICE A DAY WITH FOOD   nitroGLYCERIN  (NITROSTAT ) 0.4 MG SL tablet Place 1 tablet (0.4 mg total) under the tongue every 5 (five) minutes as needed for chest pain.   OneTouch Delica Lancets 33G MISC Use to check BG 1 to 2 times daily.  Dx:  E11.9   pantoprazole  (PROTONIX ) 40 MG tablet TAKE 1 TABLET BY MOUTH EVERY DAY   rosuvastatin  (CRESTOR ) 10 MG tablet Take 1 tablet (10 mg total) by mouth daily.   tadalafil  (CIALIS ) 20 MG tablet Take 1 tablet (20 mg total) by mouth daily as needed for erectile dysfunction.   No facility-administered encounter medications on file as of 07/10/2023.    Past Surgical History:  Procedure Laterality Date   ANTERIOR CRUCIATE LIGAMENT REPAIR Left 2012   Knee   COLONOSCOPY     POLYPECTOMY      Family History  Problem Relation Age of Onset   Hypertension Father    Diabetes Father    Heart attack Mother    Breast cancer Mother    Diabetes Paternal Grandfather    Colon cancer Neg Hx    Esophageal cancer Neg Hx    Rectal cancer Neg Hx    Stomach cancer Neg Hx    Prostate cancer Neg Hx    Pancreatic cancer Neg Hx       Controlled substance contract: n/a     Review of Systems  Constitutional:  Negative for diaphoresis.  Eyes:  Negative for pain.  Respiratory:  Negative for shortness of breath.   Cardiovascular:  Negative for chest pain, palpitations and leg swelling.  Gastrointestinal:  Negative for abdominal pain.  Endocrine: Negative for polydipsia.  Skin:  Negative for rash.  Neurological:  Negative for dizziness, weakness and headaches.  Hematological:  Does not bruise/bleed easily.  All other systems reviewed and are negative.      Objective:   Physical Exam Vitals and nursing note reviewed.  Constitutional:      Appearance: Normal appearance. He is well-developed.  HENT:     Head: Normocephalic.     Nose: Nose normal.     Mouth/Throat:     Mouth: Mucous membranes are  moist.     Pharynx: Oropharynx is clear.  Eyes:     Pupils: Pupils are equal, round, and reactive to light.  Neck:  Thyroid: No thyroid mass or thyromegaly.     Vascular: No carotid bruit or JVD.     Trachea: Phonation normal.  Cardiovascular:     Rate and Rhythm: Normal rate and regular rhythm.  Pulmonary:     Effort: Pulmonary effort is normal. No respiratory distress.     Breath sounds: Normal breath sounds.  Abdominal:     General: Bowel sounds are normal.     Palpations: Abdomen is soft.     Tenderness: There is no abdominal tenderness.  Musculoskeletal:        General: Normal range of motion.     Cervical back: Normal range of motion and neck supple.  Lymphadenopathy:     Cervical: No cervical adenopathy.  Skin:    General: Skin is warm and dry.  Neurological:     Mental Status: He is alert and oriented to person, place, and time.  Psychiatric:        Behavior: Behavior normal.        Thought Content: Thought content normal.        Judgment: Judgment normal.     BP 135/72   Pulse 70   Temp 97.9 F (36.6 C) (Temporal)   Ht 5\' 10"  (1.778 m)   Wt 251 lb (113.9 kg)   SpO2 96%   BMI 36.01 kg/m    HGBA1c 8.9%     Assessment & Plan:   DONTRAY HABERLAND comes in today with chief complaint of No chief complaint on file.   Diagnosis and orders addressed:  1. Essential hypertension, benign Low sodium diet - CBC with Differential/Platelet - CMP14+EGFR - losartan  (COZAAR ) 100 MG tablet; TAKE 1 TABLET BY MOUTH EVERY DAY  Dispense: 90 tablet; Refill: 1  2. Hyperlipidemia associated with type 2 diabetes mellitus (HCC) Low fat diet - Lipid panel  3. Diabetes mellitus treated with oral medication (HCC) Please keep diary of blood pressure Patient does not want to do medication changes- wants to try diet- wil have him come back in a month to see if trending down - Bayer DCA Hb A1c Waived - glimipiride 4mg  2 po daily #180 1 refill - metFORMIN  (GLUCOPHAGE ) 1000  MG tablet; TAKE 1 TABLET BY MOUTH TWICE A DAY WITH FOOD  Dispense: 180 tablet; Refill: 1  4. Gastroesophageal reflux disease without esophagitis Avoid spicy foods Do not eat 2 hours prior to bedtime   5. Testosterone  deficiency - Testosterone ,Free and Total  6. Anxiety 7. Recurrent major depressive disorder, in full remission (HCC) Stress management  8. OSA (obstructive sleep apnea) Wear cpap at night  9. Tobacco use Smoking cessation  10. Morbid obesity (HCC) Discussed diet and exercise for person with BMI >25 Will recheck weight in 3-6 months  11. Allergic rhinitis STOP AFRIN Flonase  nasal spray- 2 sprays in each nostril dialy #1 6 refills  12 urinary dribbling Mirapex  daily   Labs pending Health Maintenance reviewed Diet and exercise encouraged  Follow up plan: 1 months   Mary-Margaret Gaylyn Keas, FNP

## 2023-07-10 NOTE — Patient Instructions (Signed)
 Allergic Rhinitis, Adult  Allergic rhinitis is a reaction to allergens. Allergens are things that can cause an allergic reaction. This condition affects the lining inside the nose (mucous membrane). There are two types of allergic rhinitis: Seasonal. This type is also called hay fever. It happens only during some times of the year. Perennial. This type can happen at any time of the year. This condition cannot be spread from person to person (is not contagious). It can be mild, bad, or very bad. It can develop at any age and may be outgrown. What are the causes? Pollen from grasses, trees, and weeds. Other causes can be: Dust mites. Smoke. Mold. Car fumes. The pee (urine), spit, or dander of pets. Dander is dead skin cells from a pet. What increases the risk? You are more likely to develop this condition if: You have allergies in your family. You have problems like allergies in your family. You may have: Swelling of parts of your eyes and eyelids. Asthma. This affects how you breathe. Long-term redness and swelling on your skin. Food allergies. What are the signs or symptoms? The main symptom of this condition is a runny or stuffy nose (nasal congestion). Other symptoms may include: Sneezing or coughing. Itching and tearing of your eyes. Mucus that drips down the back of your throat (postnasal drip). This may cause a sore throat. Trouble sleeping. Feeling tired. Headache. How is this treated? There is no cure for this condition. You should avoid things that you are allergic to. Treatment can help to relieve symptoms. This may include: Medicines that block allergy symptoms, such as anti-inflammatories or antihistamines. These may be given as a shot, nasal spray, or pill. Avoiding things you are allergic to. Medicines that give you some of what you are allergic to over time. This is called immunotherapy. It is done if other treatments do not help. You may get: Shots. Medicine under  your tongue. Stronger medicines, if other treatments do not help. Follow these instructions at home: Avoiding allergens Find out what things you are allergic to and avoid them. To do this, try these things: If you get allergies any time of year: Replace carpet with wood, tile, or vinyl flooring. Carpet can trap pet dander and dust. Do not smoke. Do not allow smoking in your home. Change your heating and air conditioning filters at least once a month. If you get allergies only some times of the year: Keep windows closed when you can. Plan things to do outside when pollen counts are lowest. Check pollen counts before you plan things to do outside. When you come indoors, change your clothes and shower before you sit on furniture or bedding. If you are allergic to a pet: Keep the pet out of your bedroom. Vacuum, sweep, and dust often. General instructions Take over-the-counter and prescription medicines only as told by your doctor. Drink enough fluid to keep your pee pale yellow. Where to find more information American Academy of Allergy, Asthma & Immunology: aaaai.org Contact a doctor if: You have a fever. You get a cough that does not go away. You make high-pitched whistling sounds when you breathe, most often when you breathe out (wheeze). Your symptoms slow you down. Your symptoms stop you from doing your normal things each day. Get help right away if: You are short of breath. This symptom may be an emergency. Get help right away. Call 911. Do not wait to see if the symptom will go away. Do not drive yourself to the  hospital. This information is not intended to replace advice given to you by your health care provider. Make sure you discuss any questions you have with your health care provider. Document Revised: 11/15/2021 Document Reviewed: 11/15/2021 Elsevier Patient Education  2024 [employer].

## 2023-07-10 NOTE — Progress Notes (Signed)
 Clayton [name] arrived 07/10/2023 and has given verbal consent to obtain images and complete their overdue diabetic retinal screening.  The images have been sent to an ophthalmologist or optometrist for review and interpretation.  Results will be sent back to Delfina Feller, FNP for review.  Patient has been informed they will be contacted when we receive the results via telephone or MyChart

## 2023-07-11 LAB — MICROALBUMIN / CREATININE URINE RATIO
Creatinine, Urine: 43.3 mg/dL
Microalb/Creat Ratio: 102 mg/g{creat} — ABNORMAL HIGH (ref 0–29)
Microalbumin, Urine: 44.1 ug/mL

## 2023-07-11 LAB — LIPID PANEL
Cholesterol, Total: 162 mg/dL (ref 100–199)
HDL: 30 mg/dL — ABNORMAL LOW (ref >39)
LDL CALC COMMENT:: 5.4 ratio — ABNORMAL HIGH (ref 0.0–5.0)
LDL Chol Calc (NIH): 84 mg/dL (ref 0–99)
Triglycerides: 293 mg/dL — ABNORMAL HIGH (ref 0–149)
VLDL Cholesterol Cal: 48 mg/dL — ABNORMAL HIGH (ref 5–40)

## 2023-07-11 LAB — CBC WITH DIFFERENTIAL/PLATELET
Basophils Absolute: 0.1 10*3/uL (ref 0.0–0.2)
Basos: 1 %
EOS (ABSOLUTE): 0.3 10*3/uL (ref 0.0–0.4)
Eos: 3 %
Hematocrit: 42.8 % (ref 37.5–51.0)
Hemoglobin: 14.1 g/dL (ref 13.0–17.7)
Immature Grans (Abs): 0 10*3/uL (ref 0.0–0.1)
Immature Granulocytes: 0 %
Lymphocytes Absolute: 2.1 10*3/uL (ref 0.7–3.1)
Lymphs: 21 %
MCH: 31.1 pg (ref 26.6–33.0)
MCHC: 32.9 g/dL (ref 31.5–35.7)
MCV: 94 fL (ref 79–97)
Monocytes Absolute: 0.7 10*3/uL (ref 0.1–0.9)
Monocytes: 7 %
Neutrophils Absolute: 6.6 10*3/uL (ref 1.4–7.0)
Neutrophils: 68 %
Platelets: 228 10*3/uL (ref 150–450)
RBC: 4.54 x10E6/uL (ref 4.14–5.80)
RDW: 12.1 % (ref 11.6–15.4)
WBC: 9.7 10*3/uL (ref 3.4–10.8)

## 2023-07-11 LAB — CMP14+EGFR
ALT: 17 IU/L (ref 0–44)
AST: 16 IU/L (ref 0–40)
Albumin: 4.5 g/dL (ref 3.9–4.9)
Alkaline Phosphatase: 101 IU/L (ref 44–121)
BUN/Creatinine Ratio: 23 (ref 10–24)
BUN: 16 mg/dL (ref 8–27)
Bilirubin Total: 0.5 mg/dL (ref 0.0–1.2)
CO2: 22 mmol/L (ref 20–29)
Calcium: 9.8 mg/dL (ref 8.6–10.2)
Chloride: 97 mmol/L (ref 96–106)
Creatinine, Ser: 0.69 mg/dL — ABNORMAL LOW (ref 0.76–1.27)
Globulin, Total: 2.4 g/dL (ref 1.5–4.5)
Glucose: 205 mg/dL — ABNORMAL HIGH (ref 70–99)
Potassium: 4.6 mmol/L (ref 3.5–5.2)
Sodium: 136 mmol/L (ref 134–144)
Total Protein: 6.9 g/dL (ref 6.0–8.5)
eGFR: 105 mL/min/{1.73_m2} (ref >59)

## 2023-07-11 LAB — VITAMIN B12: Vitamin B-12: 428 pg/mL (ref 232–1245)

## 2023-07-17 ENCOUNTER — Encounter: Payer: Self-pay | Admitting: Nurse Practitioner

## 2023-07-20 ENCOUNTER — Telehealth: Payer: Self-pay

## 2023-07-20 NOTE — Telephone Encounter (Signed)
Left message for patient to contact the office.

## 2023-07-20 NOTE — Telephone Encounter (Signed)
 Copied from CRM [id number]. Topic: General - Other >> Jul 20, 2023  4:06 PM Blair Bumpers wrote: Reason for CRM: Patient is requesting a call back from Dr. Maxey Spangle nurse. CB #: [phone].

## 2023-07-21 ENCOUNTER — Telehealth: Payer: Self-pay

## 2023-07-21 NOTE — Telephone Encounter (Signed)
 Patient returned call and appt was changed per patients request. See other encounter

## 2023-07-21 NOTE — Telephone Encounter (Signed)
 Patient returned call requesting that appt be changed to a week earlier. Appt changed per patients request

## 2023-07-21 NOTE — Telephone Encounter (Signed)
 Copied from CRM [id number]. Topic: General - Other >> Jul 20, 2023  4:06 PM Blair Bumpers wrote: Reason for CRM: Patient is requesting a call back from Dr. Maxey Spangle nurse. CB #: [phone]. >> Jul 21, 2023 11:49 AM Tisa Forester wrote: Patient returning [facility] Call received on 07/20/23 ,  [phone].

## 2023-08-01 ENCOUNTER — Other Ambulatory Visit: Payer: Self-pay | Admitting: Nurse Practitioner

## 2023-08-01 DIAGNOSIS — N3943 Post-void dribbling: Secondary | ICD-10-CM

## 2023-08-20 ENCOUNTER — Other Ambulatory Visit: Payer: Self-pay | Admitting: Nurse Practitioner

## 2023-08-20 DIAGNOSIS — I10 Essential (primary) hypertension: Secondary | ICD-10-CM

## 2023-08-21 ENCOUNTER — Ambulatory Visit: Admitting: Nurse Practitioner

## 2023-08-21 ENCOUNTER — Encounter: Payer: Self-pay | Admitting: Nurse Practitioner

## 2023-08-21 VITALS — BP 134/72 | HR 71 | Temp 98.0°F | Ht 70.0 in | Wt 240.0 lb

## 2023-08-21 DIAGNOSIS — Z7984 Long term (current) use of oral hypoglycemic drugs: Secondary | ICD-10-CM

## 2023-08-21 DIAGNOSIS — E119 Type 2 diabetes mellitus without complications: Secondary | ICD-10-CM | POA: Diagnosis not present

## 2023-08-21 LAB — BAYER DCA HB A1C WAIVED: HB A1C (BAYER DCA - WAIVED): 7.9 % — ABNORMAL HIGH (ref 4.8–5.6)

## 2023-08-21 NOTE — Patient Instructions (Signed)

## 2023-08-21 NOTE — Progress Notes (Signed)
   Subjective:    Patient ID: Clayton [name], male    DOB: [birth year], 63 y.o.   MRN: [id number]   Chief Complaint: diabetes  HPI  Patient was seen 07/10/23 for follow up of chronic medical issues. At that time his HGBA1c was 8.9% he did not want to make changes to meds. Wanted to try diet. He is not good at checking blood sugars at home or watching diet. He is here today for recheck. He says he has really been watching his diet. Weight is down 11 lbs  Wt Readings from Last 3 Encounters:  08/21/23 240 lb (108.9 kg)  07/10/23 251 lb (113.9 kg)  03/13/23 255 lb (115.7 kg)   BMI Readings from Last 3 Encounters:  08/21/23 34.44 kg/m  07/10/23 36.01 kg/m  03/13/23 36.59 kg/m    Patient Active Problem List   Diagnosis Date Noted   Tobacco use 01/20/2020   Testosterone  deficiency    Anxiety    Allergic rhinitis    Hx of adenomatous colonic polyps 02/16/2015   Morbid obesity (HCC) 08/08/2014   Diabetes mellitus treated with oral medication (HCC) 05/09/2014   OSA (obstructive sleep apnea) 03/01/2013   Essential hypertension, benign 06/14/2012   Hyperlipidemia associated with type 2 diabetes mellitus (HCC) 06/14/2012   GERD (gastroesophageal reflux disease) 06/14/2012   Depression 06/14/2012       Review of Systems  Constitutional:  Negative for diaphoresis.  Eyes:  Negative for pain.  Respiratory:  Negative for shortness of breath.   Cardiovascular:  Negative for chest pain, palpitations and leg swelling.  Gastrointestinal:  Negative for abdominal pain.  Endocrine: Negative for polydipsia.  Skin:  Negative for rash.  Neurological:  Negative for dizziness, weakness and headaches.  Hematological:  Does not bruise/bleed easily.  All other systems reviewed and are negative.      Objective:   Physical Exam Constitutional:      Appearance: Normal appearance.  Cardiovascular:     Rate and Rhythm: Normal rate and regular rhythm.     Heart sounds: Normal heart sounds.   Pulmonary:     Effort: Pulmonary effort is normal.     Breath sounds: Normal breath sounds.  Neurological:     General: No focal deficit present.     Mental Status: He is alert and oriented to person, place, and time.  Psychiatric:        Mood and Affect: Mood normal.        Behavior: Behavior normal.    BP 134/72   Pulse 71   Temp 98 F (36.7 C) (Temporal)   Ht 5\' 10"  (1.778 m)   Wt 240 lb (108.9 kg)   SpO2 98%   BMI 34.44 kg/m   HGBA1c 7.9%       Assessment & Plan:   Silvano Drop in today with chief complaint of Diabetes   1. Diabetes mellitus treated with oral medication (HCC) (Primary) Continue to watch diet Exercise RTO in 3 months - Bayer DCA Hb A1c Waived    The above assessment and management plan was discussed with the patient. The patient verbalized understanding of and has agreed to the management plan. Patient is aware to call the clinic if symptoms persist or worsen. Patient is aware when to return to the clinic for a follow-up visit. Patient educated on when it is appropriate to go to the emergency department.   Mary-Margaret Gaylyn Keas, FNP

## 2023-08-22 ENCOUNTER — Ambulatory Visit: Payer: Self-pay | Admitting: Nurse Practitioner

## 2023-08-28 ENCOUNTER — Ambulatory Visit: Admitting: Nurse Practitioner

## 2023-09-26 ENCOUNTER — Other Ambulatory Visit: Payer: Self-pay | Admitting: Nurse Practitioner

## 2023-09-26 DIAGNOSIS — E119 Type 2 diabetes mellitus without complications: Secondary | ICD-10-CM

## 2023-09-26 MED ORDER — METFORMIN HCL 1000 MG PO TABS: ORAL_TABLET | ORAL | 0 refills | Status: DC

## 2023-09-26 NOTE — Telephone Encounter (Signed)
 Copied from CRM [id number]. Topic: Clinical - Medication Refill >> Sep 26, 2023 10:24 AM Essie A wrote: Medication: metFORMIN  (GLUCOPHAGE ) 1000 MG tablet  Has the patient contacted their pharmacy? No, had several that needed refilling (Agent: If no, request that the patient contact the pharmacy for the refill. If patient does not wish to contact the pharmacy document the reason why and proceed with request.) (Agent: If yes, when and what did the pharmacy advise?)  This is the patient's preferred pharmacy:  [id number] - [place], [place] - [address] [place] [address] Phone: [phone] Fax: [phone]  Is this the correct pharmacy for this prescription? Yes If no, delete pharmacy and type the correct one.   Has the prescription been filled recently? No  Is the patient out of the medication? Yes  Has the patient been seen for an appointment in the last year OR does the patient have an upcoming appointment? Yes  Can we respond through MyChart? No  Agent: Please be advised that Rx refills may take up to 3 business days. We ask that you follow-up with your pharmacy.

## 2023-11-09 ENCOUNTER — Other Ambulatory Visit: Payer: Self-pay | Admitting: Nurse Practitioner

## 2023-11-09 DIAGNOSIS — N3943 Post-void dribbling: Secondary | ICD-10-CM

## 2023-11-27 ENCOUNTER — Ambulatory Visit: Admitting: Nurse Practitioner

## 2023-11-27 ENCOUNTER — Encounter: Payer: Self-pay | Admitting: Nurse Practitioner

## 2023-11-27 VITALS — BP 125/65 | HR 66 | Temp 98.1°F | Ht 70.0 in | Wt 246.0 lb

## 2023-11-27 DIAGNOSIS — Z7984 Long term (current) use of oral hypoglycemic drugs: Secondary | ICD-10-CM

## 2023-11-27 DIAGNOSIS — G4733 Obstructive sleep apnea (adult) (pediatric): Secondary | ICD-10-CM

## 2023-11-27 DIAGNOSIS — E119 Type 2 diabetes mellitus without complications: Secondary | ICD-10-CM

## 2023-11-27 DIAGNOSIS — K219 Gastro-esophageal reflux disease without esophagitis: Secondary | ICD-10-CM

## 2023-11-27 DIAGNOSIS — Z720 Tobacco use: Secondary | ICD-10-CM

## 2023-11-27 DIAGNOSIS — I10 Essential (primary) hypertension: Secondary | ICD-10-CM

## 2023-11-27 DIAGNOSIS — F3342 Major depressive disorder, recurrent, in full remission: Secondary | ICD-10-CM

## 2023-11-27 DIAGNOSIS — E349 Endocrine disorder, unspecified: Secondary | ICD-10-CM

## 2023-11-27 DIAGNOSIS — F419 Anxiety disorder, unspecified: Secondary | ICD-10-CM

## 2023-11-27 DIAGNOSIS — E1169 Type 2 diabetes mellitus with other specified complication: Secondary | ICD-10-CM

## 2023-11-27 DIAGNOSIS — E785 Hyperlipidemia, unspecified: Secondary | ICD-10-CM

## 2023-11-27 LAB — CBC WITH DIFFERENTIAL/PLATELET
Basophils Absolute: 0.1 x10E3/uL (ref 0.0–0.2)
Basos: 1 %
EOS (ABSOLUTE): 0.4 x10E3/uL (ref 0.0–0.4)
Eos: 3 %
Hematocrit: 44.4 % (ref 37.5–51.0)
Hemoglobin: 14.7 g/dL (ref 13.0–17.7)
Immature Grans (Abs): 0 x10E3/uL (ref 0.0–0.1)
Immature Granulocytes: 0 %
Lymphocytes Absolute: 2.4 x10E3/uL (ref 0.7–3.1)
Lymphs: 21 %
MCH: 31.2 pg (ref 26.6–33.0)
MCHC: 33.1 g/dL (ref 31.5–35.7)
MCV: 94 fL (ref 79–97)
Monocytes Absolute: 1 x10E3/uL — ABNORMAL HIGH (ref 0.1–0.9)
Monocytes: 8 %
Neutrophils Absolute: 7.8 x10E3/uL — ABNORMAL HIGH (ref 1.4–7.0)
Neutrophils: 67 %
Platelets: 256 x10E3/uL (ref 150–450)
RBC: 4.71 x10E6/uL (ref 4.14–5.80)
RDW: 12.3 % (ref 11.6–15.4)
WBC: 11.7 x10E3/uL — ABNORMAL HIGH (ref 3.4–10.8)

## 2023-11-27 LAB — CMP14+EGFR
ALT: 15 IU/L (ref 0–44)
AST: 16 IU/L (ref 0–40)
Albumin: 4.3 g/dL (ref 3.9–4.9)
Alkaline Phosphatase: 77 IU/L (ref 44–121)
BUN/Creatinine Ratio: 22 (ref 10–24)
BUN: 20 mg/dL (ref 8–27)
Bilirubin Total: 0.9 mg/dL (ref 0.0–1.2)
CO2: 27 mmol/L (ref 20–29)
Calcium: 9.3 mg/dL (ref 8.6–10.2)
Chloride: 95 mmol/L — ABNORMAL LOW (ref 96–106)
Creatinine, Ser: 0.92 mg/dL (ref 0.76–1.27)
Globulin, Total: 2.5 g/dL (ref 1.5–4.5)
Glucose: 190 mg/dL — ABNORMAL HIGH (ref 70–99)
Potassium: 4.5 mmol/L (ref 3.5–5.2)
Sodium: 136 mmol/L (ref 134–144)
Total Protein: 6.8 g/dL (ref 6.0–8.5)
eGFR: 95 mL/min/1.73 (ref >59)

## 2023-11-27 LAB — LIPID PANEL
Chol/HDL Ratio: 4.8 ratio (ref 0.0–5.0)
Cholesterol, Total: 124 mg/dL (ref 100–199)
HDL: 26 mg/dL — ABNORMAL LOW (ref >39)
LDL Chol Calc (NIH): 61 mg/dL (ref 0–99)
Triglycerides: 229 mg/dL — ABNORMAL HIGH (ref 0–149)
VLDL Cholesterol Cal: 37 mg/dL (ref 5–40)

## 2023-11-27 LAB — BAYER DCA HB A1C WAIVED: HB A1C (BAYER DCA - WAIVED): 8.1 % — ABNORMAL HIGH (ref 4.8–5.6)

## 2023-11-27 MED ORDER — PANTOPRAZOLE SODIUM 40 MG PO TBEC: 40.0000 mg | DELAYED_RELEASE_TABLET | Freq: Every day | ORAL | 1 refills | Status: DC

## 2023-11-27 MED ORDER — METFORMIN HCL 1000 MG PO TABS: ORAL_TABLET | ORAL | 1 refills | Status: DC

## 2023-11-27 MED ORDER — LOSARTAN POTASSIUM 100 MG PO TABS: ORAL_TABLET | ORAL | 1 refills | Status: DC

## 2023-11-27 MED ORDER — TADALAFIL 20 MG PO TABS: 20.0000 mg | ORAL_TABLET | Freq: Every day | ORAL | 6 refills | Status: AC | PRN

## 2023-11-27 MED ORDER — GLIMEPIRIDE 4 MG PO TABS: 8.0000 mg | ORAL_TABLET | Freq: Every day | ORAL | 1 refills | Status: DC

## 2023-11-27 MED ORDER — ROSUVASTATIN CALCIUM 10 MG PO TABS: 10.0000 mg | ORAL_TABLET | Freq: Every day | ORAL | 1 refills | Status: DC

## 2023-11-27 MED ORDER — HYDROCHLOROTHIAZIDE 25 MG PO TABS: 25.0000 mg | ORAL_TABLET | Freq: Every day | ORAL | 1 refills | Status: DC

## 2023-11-27 MED ORDER — ESCITALOPRAM OXALATE 10 MG PO TABS: ORAL_TABLET | ORAL | 1 refills | Status: DC

## 2023-11-27 MED ORDER — FENOFIBRATE 145 MG PO TABS: 145.0000 mg | ORAL_TABLET | Freq: Every day | ORAL | 1 refills | Status: DC

## 2023-11-27 NOTE — Patient Instructions (Signed)

## 2023-11-27 NOTE — Progress Notes (Signed)
 Subjective:    Patient ID: Clayton [name], male    DOB: 28-May-1960, 63 y.o.   MRN: [id number]   Chief Complaint: medical management of chronic issues     HPI:  Clayton [name] is a 63 y.o. who identifies as a male who was assigned male at birth.   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 Essential hypertension, benign No c/o chest pain, sob or headache. Does not check blood pressure at home.  BP Readings from Last 3 Encounters:  08/21/23 134/72  07/10/23 135/72  03/13/23 (!) 165/75     2. Hyperlipidemia associated with type 2 diabetes mellitus (HCC) Does not really watch diet and does no dedicated exercise. Lab Results  Component Value Date   CHOL 162 07/10/2023   HDL 30 (L) 07/10/2023   LDLCALC 84 07/10/2023   LDLDIRECT 70 09/24/2014   TRIG 293 (H) 07/10/2023   CHOLHDL 5.4 (H) 07/10/2023   The 10-year ASCVD risk score (Arnett DK, et al., 2019) is: 33.1%    3. Diabetes mellitus treated with oral medication (HCC) Does not really check blood sugars every day. Encouraged a stricter low carb diet with possible medication changes at next visit if no improvement.. we added januvia  to meds at last visit. He did not start  Januvia  because was to expensive. So he doubled up on amaryl . Lab Results  Component Value Date   HGBA1C 7.9 (H) 08/21/2023     4. Gastroesophageal reflux disease without esophagitis Is on protonix  daily and is doing well.  5. Testosterone  deficiency Is currently on testosterone  Lab Results  Component Value Date   TESTOSTERONE  266 12/12/2022     6. Anxiety    11/27/2023    8:15 AM 07/10/2023    9:52 AM 12/12/2022    8:40 AM 09/05/2022   10:30 AM  GAD 7 : Generalized Anxiety Score  Nervous, Anxious, on Edge 0 0 0 0  Control/stop worrying 0 0 0 0  Worry too much - different things 0 0 0 0  Trouble relaxing 1 0 1 2  Restless 1  0 1 2  Easily annoyed or irritable 0 0 0 0  Afraid - awful might happen 0 0 0 0  Total GAD 7 Score 2 0 2 4  Anxiety Difficulty Somewhat difficult Not difficult at all Somewhat difficult Somewhat difficult      7. Recurrent major depressive disorder, in full remission (HCC) Is on lexapro  and is doing well    11/27/2023    8:14 AM 07/10/2023    9:53 AM 07/10/2023    9:52 AM  Depression screen PHQ 2/9  Decreased Interest 0 0 0  Down, Depressed, Hopeless 0 0 0  PHQ - 2 Score 0 0 0  Altered sleeping 1 1   Tired, decreased energy 0 0   Change in appetite 0 0   Feeling bad or failure about yourself  0 0   Trouble concentrating 0 0   Moving slowly or fidgety/restless 0 0   Suicidal thoughts 0 0   PHQ-9 Score 1 1   Difficult doing work/chores Somewhat difficult Not difficult at all      8. OSA (obstructive sleep apnea) Wears cpap machine nightly  9. Tobacco use Smokes at  least 1/2 -1 pack a day. Has not had a  low dose CT scan. He did have a chest xray  07/10/23 which was clear.  10. Morbid obesity (HCC) No recent weight changes Wt Readings from Last 3 Encounters:  08/21/23 240 lb (108.9 kg)  07/10/23 251 lb (113.9 kg)  03/13/23 255 lb (115.7 kg)    BMI Readings from Last 3 Encounters:  08/21/23 34.44 kg/m  07/10/23 36.01 kg/m  03/13/23 36.59 kg/m       New complaints: None today  No Known Allergies Outpatient Encounter Medications as of 11/27/2023  Medication Sig   aspirin  81 MG EC tablet Take 1 tablet (81 mg total) by mouth daily. Swallow whole.   escitalopram  (LEXAPRO ) 10 MG tablet TAKE 1 TABLET BY MOUTH EVERY DAY   fenofibrate  (TRICOR ) 145 MG tablet Take 1 tablet (145 mg total) by mouth daily.   fluticasone  (FLONASE ) 50 MCG/ACT nasal spray Place 2 sprays into both nostrils daily.   glimepiride  (AMARYL ) 4 MG tablet Take 2 tablets (8 mg total) by mouth daily before breakfast.   glucose blood (ONETOUCH VERIO) test strip Use to check BG 1 to 2 times daily Dx:  E11.9   hydrochlorothiazide  (HYDRODIURIL ) 25 MG tablet TAKE 1 TABLET (25 MG TOTAL) BY MOUTH DAILY.   losartan  (COZAAR ) 100 MG tablet TAKE 1 TABLET BY MOUTH EVERY DAY   metFORMIN  (GLUCOPHAGE ) 1000 MG tablet TAKE 1 TABLET BY MOUTH TWICE A DAY WITH FOOD   nitroGLYCERIN  (NITROSTAT ) 0.4 MG SL tablet Place 1 tablet (0.4 mg total) under the tongue every 5 (five) minutes as needed for chest pain.   OneTouch Delica Lancets 33G MISC Use to check BG 1 to 2 times daily.  Dx:  E11.9   pantoprazole  (PROTONIX ) 40 MG tablet TAKE 1 TABLET BY MOUTH EVERY DAY   pramipexole  (MIRAPEX ) 0.5 MG tablet TAKE 1 TABLET BY MOUTH THREE TIMES A DAY   rosuvastatin  (CRESTOR ) 10 MG tablet Take 1 tablet (10 mg total) by mouth daily.   tadalafil  (CIALIS ) 20 MG tablet Take 1 tablet (20 mg total) by mouth daily as needed for erectile dysfunction.   No facility-administered encounter medications on file as of 11/27/2023.    Past Surgical History:  Procedure Laterality Date   ANTERIOR CRUCIATE LIGAMENT REPAIR Left 2012   Knee   COLONOSCOPY     POLYPECTOMY      Family History  Problem Relation Age of Onset   Hypertension Father    Diabetes Father    Heart attack Mother    Breast cancer Mother    Diabetes Paternal Grandfather    Colon cancer Neg Hx    Esophageal cancer Neg Hx    Rectal cancer Neg Hx    Stomach cancer Neg Hx    Prostate cancer Neg Hx    Pancreatic cancer Neg Hx       Controlled substance contract: n/a     Review of Systems  Constitutional:  Negative for diaphoresis.  Eyes:  Negative for pain.  Respiratory:  Negative for shortness of breath.   Cardiovascular:  Negative for chest pain, palpitations and leg swelling.  Gastrointestinal:  Negative for abdominal pain.  Endocrine: Negative for polydipsia.  Skin:  Negative for rash.  Neurological:  Negative for dizziness, weakness and headaches.  Hematological:  Does not bruise/bleed easily.  All other systems reviewed and are negative.       Objective:   Physical Exam Vitals and nursing note reviewed.  Constitutional:      Appearance: Normal appearance. He is well-developed.  HENT:     Head: Normocephalic.     Nose: Nose normal.  Mouth/Throat:     Mouth: Mucous membranes are moist.     Pharynx: Oropharynx is clear.  Eyes:     Pupils: Pupils are equal, round, and reactive to light.  Neck:     Thyroid: No thyroid mass or thyromegaly.     Vascular: No carotid bruit or JVD.     Trachea: Phonation normal.  Cardiovascular:     Rate and Rhythm: Normal rate and regular rhythm.  Pulmonary:     Effort: Pulmonary effort is normal. No respiratory distress.     Breath sounds: Normal breath sounds.  Abdominal:     General: Bowel sounds are normal.     Palpations: Abdomen is soft.     Tenderness: There is no abdominal tenderness.  Musculoskeletal:        General: Normal range of motion.     Cervical back: Normal range of motion and neck supple.  Lymphadenopathy:     Cervical: No cervical adenopathy.  Skin:    General: Skin is warm and dry.  Neurological:     Mental Status: He is alert and oriented to person, place, and time.  Psychiatric:        Behavior: Behavior normal.        Thought Content: Thought content normal.        Judgment: Judgment normal.     BP 125/65   Pulse 66   Temp 98.1 F (36.7 C) (Temporal)   Ht 5' 10 (1.778 m)   Wt 246 lb (111.6 kg)   SpO2 95%   BMI 35.30 kg/m    HGBA1c 8.1%     Assessment & Plan:   Clayton [name] comes in today with chief complaint of No chief complaint on file.   Diagnosis and orders addressed:  1. Essential hypertension, benign Low sodium diet - CBC with Differential/Platelet - CMP14+EGFR - losartan  (COZAAR ) 100 MG tablet; TAKE 1 TABLET BY MOUTH EVERY DAY  Dispense: 90 tablet; Refill: 1  2. Hyperlipidemia associated with type 2 diabetes mellitus (HCC) Low fat diet - Lipid panel  3. Diabetes mellitus treated with oral medication (HCC) Rybellsus  sample3mg  1po daily Please keep diary of blood pressure - Bayer DCA Hb A1c Waived - sitaGLIPtin  (JANUVIA ) 100 MG tablet; Take 1 tablet (100 mg total) by mouth daily.  Dispense: 90 tablet; Refill: 0 - metFORMIN  (GLUCOPHAGE ) 1000 MG tablet; TAKE 1 TABLET BY MOUTH TWICE A DAY WITH FOOD  Dispense: 180 tablet; Refill: 1  4. Gastroesophageal reflux disease without esophagitis Avoid spicy foods Do not eat 2 hours prior to bedtime   5. Testosterone  deficiency - Testosterone ,Free and Total  6. Anxiety 7. Recurrent major depressive disorder, in full remission (HCC) Stress management  8. OSA (obstructive sleep apnea) Wear cpap at night  9. Tobacco use Smoking cessation Refuses low dose CT scan  10. Morbid obesity (HCC) Discussed diet and exercise for person with BMI >25 Will recheck weight in 3-6 months    Labs pending Health Maintenance reviewed Diet and exercise encouraged  Follow up plan: 3 months   Mary-Margaret Gladis, FNP

## 2023-11-28 ENCOUNTER — Ambulatory Visit: Payer: Self-pay | Admitting: Nurse Practitioner

## 2023-12-01 ENCOUNTER — Telehealth: Payer: Self-pay | Admitting: Family Medicine

## 2023-12-01 NOTE — Telephone Encounter (Signed)
 Copied from CRM #[id number]. Topic: Clinical - Prescription Issue >> Dec 01, 2023  4:13 PM Delon DASEN wrote: Reason for CRM: metFORMIN  (GLUCOPHAGE ) 1000 MG tablet- did not receive from pharmacy

## 2023-12-04 ENCOUNTER — Telehealth: Payer: Self-pay

## 2023-12-04 DIAGNOSIS — E119 Type 2 diabetes mellitus without complications: Secondary | ICD-10-CM

## 2023-12-04 MED ORDER — METFORMIN HCL 1000 MG PO TABS: ORAL_TABLET | ORAL | 1 refills | Status: DC

## 2023-12-04 NOTE — Addendum Note (Signed)
 Addended by: MICHELINE ROSINA FALCON on: 12/04/2023 09:33 AM   Modules accepted: Orders

## 2023-12-04 NOTE — Telephone Encounter (Signed)
 Refill re-sent

## 2023-12-04 NOTE — Telephone Encounter (Signed)
 Copied from CRM #[id number]. Topic: Clinical - Prescription Issue >> Dec 01, 2023  4:13 PM Delon DASEN wrote: Reason for CRM: metFORMIN  (GLUCOPHAGE ) 1000 MG tablet- did not receive from pharmacy

## 2024-02-22 LAB — OPHTHALMOLOGY REPORT-SCANNED

## 2024-02-26 ENCOUNTER — Encounter: Payer: Self-pay | Admitting: Nurse Practitioner

## 2024-02-26 ENCOUNTER — Ambulatory Visit: Payer: Self-pay | Admitting: Nurse Practitioner

## 2024-02-26 VITALS — BP 137/73 | HR 69 | Temp 98.3°F | Ht 70.0 in | Wt 252.0 lb

## 2024-02-26 DIAGNOSIS — E119 Type 2 diabetes mellitus without complications: Secondary | ICD-10-CM

## 2024-02-26 DIAGNOSIS — E1169 Type 2 diabetes mellitus with other specified complication: Secondary | ICD-10-CM

## 2024-02-26 DIAGNOSIS — K219 Gastro-esophageal reflux disease without esophagitis: Secondary | ICD-10-CM

## 2024-02-26 DIAGNOSIS — F419 Anxiety disorder, unspecified: Secondary | ICD-10-CM

## 2024-02-26 DIAGNOSIS — G4733 Obstructive sleep apnea (adult) (pediatric): Secondary | ICD-10-CM

## 2024-02-26 DIAGNOSIS — I10 Essential (primary) hypertension: Secondary | ICD-10-CM

## 2024-02-26 DIAGNOSIS — F3342 Major depressive disorder, recurrent, in full remission: Secondary | ICD-10-CM

## 2024-02-26 DIAGNOSIS — E349 Endocrine disorder, unspecified: Secondary | ICD-10-CM

## 2024-02-26 DIAGNOSIS — Z720 Tobacco use: Secondary | ICD-10-CM

## 2024-02-26 LAB — CMP14+EGFR
ALT: 15 IU/L (ref 0–44)
AST: 13 IU/L (ref 0–40)
Albumin: 4.3 g/dL (ref 3.9–4.9)
Alkaline Phosphatase: 85 IU/L (ref 47–123)
BUN/Creatinine Ratio: 19 (ref 10–24)
BUN: 18 mg/dL (ref 8–27)
Bilirubin Total: 0.8 mg/dL (ref 0.0–1.2)
CO2: 26 mmol/L (ref 20–29)
Calcium: 9.8 mg/dL (ref 8.6–10.2)
Chloride: 95 mmol/L — ABNORMAL LOW (ref 96–106)
Creatinine, Ser: 0.94 mg/dL (ref 0.76–1.27)
Globulin, Total: 2.7 g/dL (ref 1.5–4.5)
Glucose: 187 mg/dL — ABNORMAL HIGH (ref 70–99)
Potassium: 4.6 mmol/L (ref 3.5–5.2)
Sodium: 135 mmol/L (ref 134–144)
Total Protein: 7 g/dL (ref 6.0–8.5)
eGFR: 92 mL/min/1.73 (ref >59)

## 2024-02-26 LAB — BAYER DCA HB A1C WAIVED: HB A1C (BAYER DCA - WAIVED): 8.8 % — ABNORMAL HIGH (ref 4.8–5.6)

## 2024-02-26 LAB — CBC WITH DIFFERENTIAL/PLATELET
Basophils Absolute: 0.1 x10E3/uL (ref 0.0–0.2)
Basos: 1 %
EOS (ABSOLUTE): 0.3 x10E3/uL (ref 0.0–0.4)
Eos: 3 %
Hematocrit: 43.6 % (ref 37.5–51.0)
Hemoglobin: 14.3 g/dL (ref 13.0–17.7)
Immature Grans (Abs): 0 x10E3/uL (ref 0.0–0.1)
Immature Granulocytes: 0 %
Lymphocytes Absolute: 2.5 x10E3/uL (ref 0.7–3.1)
Lymphs: 21 %
MCH: 30.2 pg (ref 26.6–33.0)
MCHC: 32.8 g/dL (ref 31.5–35.7)
MCV: 92 fL (ref 79–97)
Monocytes Absolute: 0.9 x10E3/uL (ref 0.1–0.9)
Monocytes: 7 %
Neutrophils Absolute: 7.9 x10E3/uL — ABNORMAL HIGH (ref 1.4–7.0)
Neutrophils: 68 %
Platelets: 263 x10E3/uL (ref 150–450)
RBC: 4.73 x10E6/uL (ref 4.14–5.80)
RDW: 12.5 % (ref 11.6–15.4)
WBC: 11.7 x10E3/uL — ABNORMAL HIGH (ref 3.4–10.8)

## 2024-02-26 LAB — LIPID PANEL
Chol/HDL Ratio: 4.6 ratio (ref 0.0–5.0)
Cholesterol, Total: 129 mg/dL (ref 100–199)
HDL: 28 mg/dL — ABNORMAL LOW (ref >39)
LDL Chol Calc (NIH): 68 mg/dL (ref 0–99)
Triglycerides: 196 mg/dL — ABNORMAL HIGH (ref 0–149)
VLDL Cholesterol Cal: 33 mg/dL (ref 5–40)

## 2024-02-26 MED ORDER — ESCITALOPRAM OXALATE 10 MG PO TABS: ORAL_TABLET | ORAL | 1 refills | Status: AC

## 2024-02-26 MED ORDER — METFORMIN HCL 1000 MG PO TABS: ORAL_TABLET | ORAL | 1 refills | Status: AC

## 2024-02-26 MED ORDER — LOSARTAN POTASSIUM 100 MG PO TABS: ORAL_TABLET | ORAL | 1 refills | Status: AC

## 2024-02-26 MED ORDER — GLIMEPIRIDE 4 MG PO TABS: 8.0000 mg | ORAL_TABLET | Freq: Every day | ORAL | 1 refills | Status: AC

## 2024-02-26 MED ORDER — ROSUVASTATIN CALCIUM 10 MG PO TABS: 10.0000 mg | ORAL_TABLET | Freq: Every day | ORAL | 1 refills | Status: AC

## 2024-02-26 MED ORDER — PANTOPRAZOLE SODIUM 40 MG PO TBEC: 40.0000 mg | DELAYED_RELEASE_TABLET | Freq: Every day | ORAL | 1 refills | Status: AC

## 2024-02-26 MED ORDER — HYDROCHLOROTHIAZIDE 25 MG PO TABS: 25.0000 mg | ORAL_TABLET | Freq: Every day | ORAL | 1 refills | Status: AC

## 2024-02-26 MED ORDER — FENOFIBRATE 145 MG PO TABS: 145.0000 mg | ORAL_TABLET | Freq: Every day | ORAL | 1 refills | Status: AC

## 2024-02-26 NOTE — Patient Instructions (Signed)

## 2024-02-26 NOTE — Progress Notes (Signed)
 Subjective:    Patient ID: Clayton [name], male    DOB: [birth year], 63 y.o.   MRN: [id number]   Chief Complaint: medical management of chronic issues     HPI:  Clayton [name] is a 63 y.o. who identifies as a male who was assigned male at birth.   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 Essential hypertension, benign No c/o chest pain, sob or headache. Does not check blood pressure at home.  BP Readings from Last 3 Encounters:  11/27/23 125/65  08/21/23 134/72  07/10/23 135/72     2. Hyperlipidemia associated with type 2 diabetes mellitus (HCC) Does not really watch diet and does no dedicated exercise. Lab Results  Component Value Date   CHOL 124 11/27/2023   HDL 26 (L) 11/27/2023   LDLCALC 61 11/27/2023   LDLDIRECT 70 09/24/2014   TRIG 229 (H) 11/27/2023   CHOLHDL 4.8 11/27/2023   The ASCVD Risk score (Arnett DK, et al., 2019) failed to calculate for the following reasons:   The valid total cholesterol range is 130 to 320 mg/dL    3. Diabetes mellitus treated with oral medication (HCC) Does not really check blood sugars every day. Encouraged a stricter low carb diet with possible medication changes at next visit if no improvement.. we added januvia  to meds at last visit. He did not start  Januvia  because was to expensive. So he doubled up on amaryl . Lab Results  Component Value Date   HGBA1C 8.1 (H) 11/27/2023     4. Gastroesophageal reflux disease without esophagitis Is on protonix  daily and is doing well.  5. Testosterone  deficiency Is currently on testosterone  Lab Results  Component Value Date   TESTOSTERONE  266 12/12/2022     6. Anxiety     02/26/2024    9:58 AM 11/27/2023    8:15 AM 07/10/2023    9:52 AM 12/12/2022    8:40 AM  GAD 7 : Generalized Anxiety Score  Nervous, Anxious, on Edge 0 0 0 0  Control/stop worrying 0 0 0 0   Worry too much - different things 0 0 0 0  Trouble relaxing 0 1 0 1  Restless 0 1 0 1  Easily annoyed or irritable 0 0 0 0  Afraid - awful might happen 0 0 0 0  Total GAD 7 Score 0 2 0 2  Anxiety Difficulty Not difficult at all Somewhat difficult Not difficult at all Somewhat difficult         7. Recurrent major depressive disorder, in full remission (HCC) Is on lexapro  and is doing well     02/26/2024    9:58 AM 11/27/2023    8:14 AM 07/10/2023    9:53 AM  Depression screen PHQ 2/9  Decreased Interest 0 0 0  Down, Depressed, Hopeless 0 0 0  PHQ - 2 Score 0 0 0  Altered sleeping 0 1 1  Tired, decreased energy 0 0 0  Change in appetite 0 0 0  Feeling bad or failure about yourself  0 0 0  Trouble concentrating 0 0 0  Moving slowly or fidgety/restless 0 0 0  Suicidal thoughts 0 0 0  PHQ-9 Score 0 1  1   Difficult doing work/chores Not difficult at all Somewhat difficult Not difficult at all     Data saved with a previous flowsheet row definition  8. OSA (obstructive sleep apnea) Wears cpap machine nightly  9. Tobacco use Smokes at  least 1/2 -1 pack a day. Has not had a  low dose CT scan. He did have a chest xray 07/10/23 which was clear.  10. Morbid obesity (HCC) Weight is up 6 lbs  Wt Readings from Last 3 Encounters:  02/26/24 252 lb (114.3 kg)  11/27/23 246 lb (111.6 kg)  08/21/23 240 lb (108.9 kg)   BMI Readings from Last 3 Encounters:  02/26/24 36.16 kg/m  11/27/23 35.30 kg/m  08/21/23 34.44 kg/m           New complaints: None today  No Known Allergies Outpatient Encounter Medications as of 02/26/2024  Medication Sig   aspirin  81 MG EC tablet Take 1 tablet (81 mg total) by mouth daily. Swallow whole.   escitalopram  (LEXAPRO ) 10 MG tablet TAKE 1 TABLET BY MOUTH EVERY DAY   fenofibrate  (TRICOR ) 145 MG tablet Take 1 tablet (145 mg total) by mouth daily.   fluticasone  (FLONASE ) 50 MCG/ACT nasal spray Place 2 sprays into both nostrils daily.    glimepiride  (AMARYL ) 4 MG tablet Take 2 tablets (8 mg total) by mouth daily before breakfast.   glucose blood (ONETOUCH VERIO) test strip Use to check BG 1 to 2 times daily Dx: E11.9   hydrochlorothiazide  (HYDRODIURIL ) 25 MG tablet Take 1 tablet (25 mg total) by mouth daily.   losartan  (COZAAR ) 100 MG tablet TAKE 1 TABLET BY MOUTH EVERY DAY   metFORMIN  (GLUCOPHAGE ) 1000 MG tablet TAKE 1 TABLET BY MOUTH TWICE A DAY WITH FOOD   nitroGLYCERIN  (NITROSTAT ) 0.4 MG SL tablet Place 1 tablet (0.4 mg total) under the tongue every 5 (five) minutes as needed for chest pain.   OneTouch Delica Lancets 33G MISC Use to check BG 1 to 2 times daily.  Dx:  E11.9   pantoprazole  (PROTONIX ) 40 MG tablet Take 1 tablet (40 mg total) by mouth daily.   rosuvastatin  (CRESTOR ) 10 MG tablet Take 1 tablet (10 mg total) by mouth daily.   tadalafil  (CIALIS ) 20 MG tablet Take 1 tablet (20 mg total) by mouth daily as needed for erectile dysfunction.   No facility-administered encounter medications on file as of 02/26/2024.    Past Surgical History:  Procedure Laterality Date   ANTERIOR CRUCIATE LIGAMENT REPAIR Left 2012   Knee   COLONOSCOPY     POLYPECTOMY      Family History  Problem Relation Age of Onset   Hypertension Father    Diabetes Father    Heart attack Mother    Breast cancer Mother    Diabetes Paternal Grandfather    Colon cancer Neg Hx    Esophageal cancer Neg Hx    Rectal cancer Neg Hx    Stomach cancer Neg Hx    Prostate cancer Neg Hx    Pancreatic cancer Neg Hx       Controlled substance contract: n/a     Review of Systems  Constitutional:  Negative for diaphoresis.  Eyes:  Negative for pain.  Respiratory:  Negative for shortness of breath.   Cardiovascular:  Negative for chest pain, palpitations and leg swelling.  Gastrointestinal:  Negative for abdominal pain.  Endocrine: Negative for polydipsia.  Skin:  Negative for rash.  Neurological:  Negative for dizziness, weakness and  headaches.  Hematological:  Does not bruise/bleed easily.  All other systems reviewed and are negative.      Objective:   Physical Exam Vitals and nursing note reviewed.  Constitutional:      Appearance: Normal appearance. He is well-developed.  HENT:     Head: Normocephalic.     Nose: Nose normal.     Mouth/Throat:     Mouth: Mucous membranes are moist.     Pharynx: Oropharynx is clear.  Eyes:     Pupils: Pupils are equal, round, and reactive to light.  Neck:     Thyroid: No thyroid mass or thyromegaly.     Vascular: No carotid bruit or JVD.     Trachea: Phonation normal.  Cardiovascular:     Rate and Rhythm: Normal rate and regular rhythm.  Pulmonary:     Effort: Pulmonary effort is normal. No respiratory distress.     Breath sounds: Normal breath sounds.  Abdominal:     General: Bowel sounds are normal.     Palpations: Abdomen is soft.     Tenderness: There is no abdominal tenderness.  Musculoskeletal:        General: Normal range of motion.     Cervical back: Normal range of motion and neck supple.  Lymphadenopathy:     Cervical: No cervical adenopathy.  Skin:    General: Skin is warm and dry.  Neurological:     Mental Status: He is alert and oriented to person, place, and time.  Psychiatric:        Behavior: Behavior normal.        Thought Content: Thought content normal.        Judgment: Judgment normal.     BP 137/73   Pulse 69   Temp 98.3 F (36.8 C) (Temporal)   Ht 5' 10 (1.778 m)   Wt 252 lb (114.3 kg)   SpO2 95%   BMI 36.16 kg/m      HGBA1c 8.8%     Assessment & Plan:   Clayton [name] comes in today with chief complaint of medical management of chronic issues    Diagnosis and orders addressed:  1. Essential hypertension, benign Low sodium diet - CBC with Differential/Platelet - CMP14+EGFR - losartan  (COZAAR ) 100 MG tablet; TAKE 1 TABLET BY MOUTH EVERY DAY  Dispense: 90 tablet; Refill: 1  2. Hyperlipidemia associated with type  2 diabetes mellitus (HCC) Low fat diet - Lipid panel  3. Diabetes mellitus treated with oral medication (HCC) Rybellsus sample3mg  1po daily Please keep diary of blood sugar Since retired he Is is going to try watching diet more closely - Bayer DCA Hb A1c Waived - sitaGLIPtin  (JANUVIA ) 100 MG tablet; Take 1 tablet (100 mg total) by mouth daily.  Dispense: 90 tablet; Refill: 0 - metFORMIN  (GLUCOPHAGE ) 1000 MG tablet; TAKE 1 TABLET BY MOUTH TWICE A DAY WITH FOOD  Dispense: 180 tablet; Refill: 1  4. Gastroesophageal reflux disease without esophagitis Avoid spicy foods Do not eat 2 hours prior to bedtime   5. Testosterone  deficiency - Testosterone ,Free and Total  6. Anxiety 7. Recurrent major depressive disorder, in full remission (HCC) Stress management  8. OSA (obstructive sleep apnea) Wear cpap at night  9. Tobacco use Smoking cessation Refuses low dose CT scan  10. Morbid obesity (HCC) Discussed diet and exercise for person with BMI >25 Will recheck weight in 3-6 months    Labs pending Health Maintenance reviewed Diet and exercise encouraged  Follow up plan: 3 months   Mary-Margaret Gladis, FNP

## 2024-02-27 ENCOUNTER — Ambulatory Visit: Payer: Self-pay | Admitting: Nurse Practitioner

## 2024-03-19 ENCOUNTER — Other Ambulatory Visit: Payer: Self-pay | Admitting: Nurse Practitioner

## 2024-03-19 DIAGNOSIS — J302 Other seasonal allergic rhinitis: Secondary | ICD-10-CM

## 2024-03-19 NOTE — Telephone Encounter (Unsigned)
 Copied from CRM [id number]. Topic: Clinical - Medication Refill >> Mar 19, 2024 10:05 AM Miquel SAILOR wrote: Medication: fluticasone  (FLONASE ) 50 MCG/ACT nasal spray   Has the patient contacted their pharmacy? Yes (Agent: If no, request that the patient contact the pharmacy for the refill. If patient does not wish to contact the pharmacy document the reason why and proceed with request.) (Agent: If yes, when and what did the pharmacy advise?)  This is the patient's preferred pharmacy:  [id number] - [place], [place] - [address] [place] [address] Phone: [phone] Fax: [phone]  Is this the correct pharmacy for this prescription? Yes If no, delete pharmacy and type the correct one.   Has the prescription been filled recently? Yes  Is the patient out of the medication? Yes  Has the patient been seen for an appointment in the last year OR does the patient have an upcoming appointment? Yes  Can we respond through MyChart? Yes  Agent: Please be advised that Rx refills may take up to 3 business days. We ask that you follow-up with your pharmacy.

## 2024-03-19 NOTE — Telephone Encounter (Signed)
Fm

## 2024-03-26 MED ORDER — FLUTICASONE PROPIONATE 50 MCG/ACT NA SUSP: 2.0000 | Freq: Every day | NASAL | 6 refills | Status: AC

## 2024-05-23 ENCOUNTER — Ambulatory Visit: Admitting: Nurse Practitioner
